# Patient Record
Sex: Male | Born: 1950
Health system: Southern US, Community
[De-identification: ages and names within clinical notes are randomized; demographics above are authoritative.]

## PROBLEM LIST (undated history)

## (undated) DIAGNOSIS — G43909 Migraine, unspecified, not intractable, without status migrainosus: Secondary | ICD-10-CM

## (undated) DIAGNOSIS — I10 Essential (primary) hypertension: Secondary | ICD-10-CM

## (undated) DIAGNOSIS — I471 Supraventricular tachycardia, unspecified: Secondary | ICD-10-CM

## (undated) DIAGNOSIS — I2510 Atherosclerotic heart disease of native coronary artery without angina pectoris: Secondary | ICD-10-CM

## (undated) DIAGNOSIS — R001 Bradycardia, unspecified: Secondary | ICD-10-CM

## (undated) DIAGNOSIS — E785 Hyperlipidemia, unspecified: Secondary | ICD-10-CM

## (undated) DIAGNOSIS — R55 Syncope and collapse: Secondary | ICD-10-CM

## (undated) DIAGNOSIS — R911 Solitary pulmonary nodule: Secondary | ICD-10-CM

## (undated) DIAGNOSIS — K219 Gastro-esophageal reflux disease without esophagitis: Secondary | ICD-10-CM

## (undated) HISTORY — DX: Solitary pulmonary nodule: R91.1

## (undated) HISTORY — DX: Gastro-esophageal reflux disease without esophagitis: K21.9

## (undated) HISTORY — DX: Syncope and collapse: R55

## (undated) HISTORY — DX: Migraine, unspecified, not intractable, without status migrainosus: G43.909

## (undated) HISTORY — DX: Essential (primary) hypertension: I10

---

## 2003-10-19 ENCOUNTER — Emergency Department (HOSPITAL_COMMUNITY): Admission: EM | Admit: 2003-10-19 | Discharge: 2003-10-19 | Payer: Self-pay | Admitting: Emergency Medicine

## 2003-11-02 ENCOUNTER — Encounter: Payer: Self-pay | Admitting: Emergency Medicine

## 2003-11-02 ENCOUNTER — Inpatient Hospital Stay (HOSPITAL_COMMUNITY): Admission: AD | Admit: 2003-11-02 | Discharge: 2003-11-04 | Payer: Self-pay | Admitting: Cardiovascular Disease

## 2004-10-26 ENCOUNTER — Observation Stay (HOSPITAL_COMMUNITY): Admission: EM | Admit: 2004-10-26 | Discharge: 2004-10-27 | Payer: Self-pay | Admitting: Emergency Medicine

## 2004-11-17 ENCOUNTER — Ambulatory Visit (HOSPITAL_COMMUNITY): Admission: RE | Admit: 2004-11-17 | Discharge: 2004-11-17 | Payer: Self-pay | Admitting: *Deleted

## 2006-06-02 ENCOUNTER — Emergency Department (HOSPITAL_COMMUNITY): Admission: EM | Admit: 2006-06-02 | Discharge: 2006-06-03 | Payer: Self-pay | Admitting: Emergency Medicine

## 2006-08-31 ENCOUNTER — Emergency Department (HOSPITAL_COMMUNITY): Admission: EM | Admit: 2006-08-31 | Discharge: 2006-08-31 | Payer: Self-pay | Admitting: Emergency Medicine

## 2010-01-14 ENCOUNTER — Inpatient Hospital Stay (HOSPITAL_COMMUNITY): Admission: EM | Admit: 2010-01-14 | Discharge: 2010-01-15 | Payer: Self-pay | Admitting: Emergency Medicine

## 2010-01-14 DIAGNOSIS — I10 Essential (primary) hypertension: Secondary | ICD-10-CM

## 2010-01-14 HISTORY — DX: Essential (primary) hypertension: I10

## 2010-11-06 ENCOUNTER — Emergency Department (HOSPITAL_COMMUNITY)
Admission: EM | Admit: 2010-11-06 | Discharge: 2010-11-06 | Payer: Self-pay | Source: Home / Self Care | Admitting: Emergency Medicine

## 2011-01-31 LAB — POCT I-STAT, CHEM 8
BUN: 12 mg/dL (ref 6–23)
Calcium, Ion: 1.1 mmol/L — ABNORMAL LOW (ref 1.12–1.32)
Chloride: 110 mEq/L (ref 96–112)
Creatinine, Ser: 1 mg/dL (ref 0.4–1.5)
Glucose, Bld: 92 mg/dL (ref 70–99)
HCT: 42 % (ref 39.0–52.0)
Hemoglobin: 14.3 g/dL (ref 13.0–17.0)
Potassium: 4.1 mEq/L (ref 3.5–5.1)
Sodium: 144 mEq/L (ref 135–145)
TCO2: 27 mmol/L (ref 0–100)

## 2011-01-31 LAB — CARDIAC PANEL(CRET KIN+CKTOT+MB+TROPI)
CK, MB: 1.5 ng/mL (ref 0.3–4.0)
CK, MB: 1.5 ng/mL (ref 0.3–4.0)
Total CK: 178 U/L (ref 7–232)
Total CK: 203 U/L (ref 7–232)
Total CK: 215 U/L (ref 7–232)
Troponin I: 0.01 ng/mL (ref 0.00–0.06)
Troponin I: 0.04 ng/mL (ref 0.00–0.06)

## 2011-01-31 LAB — CBC
HCT: 41.5 % (ref 39.0–52.0)
Hemoglobin: 14.1 g/dL (ref 13.0–17.0)
MCHC: 33.9 g/dL (ref 30.0–36.0)
MCV: 89.1 fL (ref 78.0–100.0)
Platelets: 167 10*3/uL (ref 150–400)
RBC: 4.66 MIL/uL (ref 4.22–5.81)
RDW: 13.9 % (ref 11.5–15.5)
WBC: 5.9 10*3/uL (ref 4.0–10.5)

## 2011-01-31 LAB — DIFFERENTIAL
Basophils Absolute: 0 10*3/uL (ref 0.0–0.1)
Basophils Relative: 0 % (ref 0–1)
Eosinophils Absolute: 0.3 10*3/uL (ref 0.0–0.7)
Eosinophils Relative: 5 % (ref 0–5)
Lymphocytes Relative: 26 % (ref 12–46)
Lymphs Abs: 1.5 10*3/uL (ref 0.7–4.0)
Monocytes Absolute: 0.4 10*3/uL (ref 0.1–1.0)
Monocytes Relative: 7 % (ref 3–12)
Neutro Abs: 3.7 10*3/uL (ref 1.7–7.7)
Neutrophils Relative %: 62 % (ref 43–77)

## 2011-01-31 LAB — D-DIMER, QUANTITATIVE: D-Dimer, Quant: 0.25 ug/mL-FEU (ref 0.00–0.48)

## 2011-01-31 LAB — LIPID PANEL
LDL Cholesterol: 108 mg/dL — ABNORMAL HIGH (ref 0–99)
Total CHOL/HDL Ratio: 3.6 RATIO
VLDL: 14 mg/dL (ref 0–40)

## 2011-01-31 LAB — CK TOTAL AND CKMB (NOT AT ARMC)
CK, MB: 2.1 ng/mL (ref 0.3–4.0)
CK, MB: 2.1 ng/mL (ref 0.3–4.0)
Relative Index: 0.9 (ref 0.0–2.5)
Relative Index: 0.9 (ref 0.0–2.5)
Total CK: 245 U/L — ABNORMAL HIGH (ref 7–232)

## 2011-01-31 LAB — BASIC METABOLIC PANEL
CO2: 30 mEq/L (ref 19–32)
Calcium: 9 mg/dL (ref 8.4–10.5)
Creatinine, Ser: 1.09 mg/dL (ref 0.4–1.5)
GFR calc Af Amer: >60 mL/min (ref >60)
Sodium: 145 mEq/L (ref 135–145)

## 2011-01-31 LAB — TROPONIN I: Troponin I: 0.01 ng/mL (ref 0.00–0.06)

## 2011-01-31 LAB — PROTIME-INR
INR: 1.06 (ref 0.00–1.49)
Prothrombin Time: 13.7 seconds (ref 11.6–15.2)

## 2011-03-26 NOTE — Discharge Summary (Signed)
Edward Freeman, Edward Freeman                ACCOUNT NO.:  [id number]   MEDICAL RECORD NO.:  [id number]          PATIENT TYPE:  INP   LOCATION:  3731                         FACILITY:  [facility]   PHYSICIAN:  Nanetta Batty, M.D.   DATE OF BIRTH:  06-Apr-1951   DATE OF ADMISSION:  10/26/2004  DATE OF DISCHARGE:  10/27/2004                                 DISCHARGE SUMMARY   DISCHARGE DIAGNOSES:  1.  Syncope, probably neurocardiogenic.  2.  History of normal coronaries and normal left ventricular function.   HOSPITAL COURSE:  The patient is a 60 year old male who presented to the  emergency room on October 26, 2004, because of a syncopal spell.  He was on  a ladder up in the attic to check on a noise.  He thought there was an  animal in his attic.  On the way down, he had chest and fell to his knees  and became sweaty and nauseated.  He had near syncope.  He came to the  emergency room.  He was seen by Dr. Elsie Lincoln on admission.  He has had  previous work-up for similar symptoms in the past.  He apparently had a  catheterization November 04, 2003, and showed normal coronaries and normal  LV function.  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  He  had a negative tilt table test at [facility] on 2002.  The patient  was admitted to telemetry.  He was monitored.  He had sinus rhythm and sinus  bradycardia in the high 40s to mid 50s.  He did not have any significant AV  block.  Dr. Alanda Amass felt he could be ambulated on October 27, 2004, and  discharged late on October 27, 2004.  Dr. Alanda Amass wanted him on low dose  beta-blocker.  He was put on pindolol 2.5 mg a day at discharge.  Dr.  Alanda Amass feels he may need tilt table if he has recurrent symptoms.   DISCHARGE MEDICATION:  Pindolol 5 mg one half tablet a day.   LABORATORY DATA:  White count 8.0, hemoglobin 14.9, hematocrit 43.1,  platelets 201.   Sodium 139, potassium 3.7, BUN 11, creatinine 1.0.  LFTs are  normal.  Lipid profile is pending.  TSH is 0.86.   Chest x-ray showed mild cardiomegaly.  INR 1.0.  Cardiac markers are  negative x3.   EKG reveals sinus rhythm, sinus bradycardia with a QTC of 394.   DISPOSITION:  The patient is discharged in stable condition and will follow  up with Dr. Allyson Sabal.  He was set up for an echocardiogram  in the office  before his follow-up with Dr. Allyson Sabal.      LKK/MEDQ  D:  10/27/2004  T:  10/28/2004  Job:  [id number]   cc:   Harrel Lemon. Merla Riches, M.D.  [address]  [place]  [place] [address]  Fax: [phone]
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
## 2013-03-27 ENCOUNTER — Other Ambulatory Visit: Payer: Self-pay

## 2013-03-27 MED ORDER — AMLODIPINE BESYLATE 10 MG PO TABS: 10.0000 mg | ORAL_TABLET | Freq: Every day | ORAL | Status: DC

## 2013-03-27 NOTE — Addendum Note (Signed)
Addended by: Sueanne Margarita on: 03/27/2013 04:36 PM   Modules accepted: Orders

## 2013-03-27 NOTE — Telephone Encounter (Signed)
Rx was sent to pharmacy electronically. 

## 2013-04-25 ENCOUNTER — Other Ambulatory Visit: Payer: Self-pay | Admitting: Cardiovascular Disease

## 2013-04-30 ENCOUNTER — Other Ambulatory Visit: Payer: Self-pay | Admitting: Cardiovascular Disease

## 2013-05-01 NOTE — Telephone Encounter (Signed)
Rx was sent to pharmacy electronically. 

## 2013-05-09 ENCOUNTER — Other Ambulatory Visit: Payer: Self-pay | Admitting: Cardiovascular Disease

## 2013-05-10 ENCOUNTER — Encounter: Payer: Self-pay | Admitting: *Deleted

## 2013-05-14 ENCOUNTER — Encounter: Payer: Self-pay | Admitting: Cardiovascular Disease

## 2013-05-17 ENCOUNTER — Ambulatory Visit (INDEPENDENT_AMBULATORY_CARE_PROVIDER_SITE_OTHER): Payer: BC Managed Care – PPO | Admitting: Cardiovascular Disease

## 2013-05-17 ENCOUNTER — Encounter: Payer: Self-pay | Admitting: Cardiovascular Disease

## 2013-05-17 VITALS — BP 128/68 | HR 55 | Ht 70.5 in | Wt 194.6 lb

## 2013-05-17 DIAGNOSIS — R5381 Other malaise: Secondary | ICD-10-CM

## 2013-05-17 DIAGNOSIS — R0600 Dyspnea, unspecified: Secondary | ICD-10-CM

## 2013-05-17 DIAGNOSIS — M79606 Pain in leg, unspecified: Secondary | ICD-10-CM | POA: Insufficient documentation

## 2013-05-17 DIAGNOSIS — R222 Localized swelling, mass and lump, trunk: Secondary | ICD-10-CM

## 2013-05-17 DIAGNOSIS — R5383 Other fatigue: Secondary | ICD-10-CM

## 2013-05-17 DIAGNOSIS — Z79899 Other long term (current) drug therapy: Secondary | ICD-10-CM

## 2013-05-17 DIAGNOSIS — E785 Hyperlipidemia, unspecified: Secondary | ICD-10-CM

## 2013-05-17 DIAGNOSIS — I10 Essential (primary) hypertension: Secondary | ICD-10-CM

## 2013-05-17 DIAGNOSIS — I739 Peripheral vascular disease, unspecified: Secondary | ICD-10-CM

## 2013-05-17 DIAGNOSIS — R0602 Shortness of breath: Secondary | ICD-10-CM | POA: Insufficient documentation

## 2013-05-17 DIAGNOSIS — R0609 Other forms of dyspnea: Secondary | ICD-10-CM

## 2013-05-17 NOTE — Patient Instructions (Signed)
Your physician recommends that you schedule a follow-up appointment in: 1 month  Your physician recommends that you return for lab work CMP, CBC, TSH, FREE T4, LIPIDS  Your physician has requested that you have an echocardiogram. Echocardiography is a painless test that uses sound waves to create images of your heart. It provides your doctor with information about the size and shape of your heart and how well your heart's chambers and valves are working. This procedure takes approximately one hour. There are no restrictions for this procedure.  Your physician has requested that you have a lower extremity arterial exercise duplex. During this test, exercise and ultrasound are used to evaluate arterial blood flow in the legs. Allow one hour for this exam. There are no restrictions or special instructions.  Non-Cardiac CT scanning, (CAT scanning), is a noninvasive, special x-ray that produces cross-sectional images of the body using x-rays and a computer. CT scans help physicians diagnose and treat medical conditions. For some CT exams, a contrast material is used to enhance visibility in the area of the body being studied. CT scans provide greater clarity and reveal more details than regular x-ray exams.

## 2013-05-17 NOTE — Progress Notes (Signed)
   05/17/2013 Edward Freeman   Dec 20, 1950  [id number]  Primary Physician No primary provider on file. Primary Cardiologist: Runell Gess MD Roseanne Reno   HPI:  Edward Freeman is a 62 year old married African American male father of 2, grandfather to 2 grandchildren he works doing Insurance claims handler. He was seen by Dr. Clarene Duke last approximately 16 months ago. I performed cardiac catheterization on him 11/04/03 which was completely normal. His cardiac risk factors include hypertension as well as family history with a mother who died of a myocardial infarction at age 57. He denies chest pain but does complain of increasing dyspnea on exertion. He also had a small apical nodule by CT scan several years ago with recommendations of followup which was never pursued     No Known Allergies  History   Social History  . Marital Status: Single    Spouse Name: N/A    Number of Children: N/A  . Years of Education: N/A   Occupational History  . Not on file.   Social History Main Topics  . Smoking status: Never Smoker   . Smokeless tobacco: Never Used  . Alcohol Use: Yes     Comment: 1-2 beers a month  . Drug Use: No  . Sexually Active: Not on file   Other Topics Concern  . Not on file   Social History Narrative  . No narrative on file     Review of Systems: General: negative for chills, fever, night sweats or weight changes.  Cardiovascular: negative for chest pain, dyspnea on exertion, edema, orthopnea, palpitations, paroxysmal nocturnal dyspnea or shortness of breath Dermatological: negative for rash Respiratory: negative for cough or wheezing Urologic: negative for hematuria Abdominal: negative for nausea, vomiting, diarrhea, bright red blood per rectum, melena, or hematemesis Neurologic: negative for visual changes, syncope, or dizziness All other systems reviewed and are otherwise negative except as noted above.    Blood pressure 128/68, pulse 55, height 5'  10.5" (1.791 m), weight 194 lb 9.6 oz (88.27 kg).  General appearance: alert and no distress Neck: no adenopathy, no carotid bruit, no JVD, supple, symmetrical, trachea midline and thyroid not enlarged, symmetric, no tenderness/mass/nodules Lungs: clear to auscultation bilaterally Heart: regular rate and rhythm, S1, S2 normal, no murmur, click, rub or gallop Extremities: extremities normal, atraumatic, no cyanosis or edema Pulses: 2+ and symmetric  EKG first bradycardia at 55 without ST or T wave changes  ASSESSMENT AND PLAN:   Essential hypertension Under good control on current medications  Dyspnea on exertion No history of tobacco abuse. Will check 2-D echocardiogram  Leg pain There are atypical features and it sounds more like restless leg syndrome however we will get llower extremity arterial Dopplers to evaluate      Runell Gess MD [facility], [facility] 05/17/2013 10:48 AM

## 2013-05-17 NOTE — Assessment & Plan Note (Signed)
There are atypical features and it sounds more like restless leg syndrome however we will get llower extremity arterial Dopplers to evaluate

## 2013-05-17 NOTE — Assessment & Plan Note (Signed)
Under good control on current medications 

## 2013-05-17 NOTE — Assessment & Plan Note (Signed)
No history of tobacco abuse. Will check 2-D echocardiogram

## 2013-05-22 ENCOUNTER — Other Ambulatory Visit: Payer: BC Managed Care – PPO

## 2013-06-05 ENCOUNTER — Ambulatory Visit (HOSPITAL_COMMUNITY): Payer: BC Managed Care – PPO

## 2013-06-05 ENCOUNTER — Encounter (HOSPITAL_COMMUNITY): Payer: BC Managed Care – PPO

## 2013-06-11 ENCOUNTER — Ambulatory Visit: Payer: BC Managed Care – PPO | Admitting: Cardiovascular Disease

## 2013-06-17 ENCOUNTER — Other Ambulatory Visit: Payer: Self-pay | Admitting: Cardiovascular Disease

## 2013-06-18 NOTE — Telephone Encounter (Signed)
Rx was sent to pharmacy electronically. 

## 2013-06-25 ENCOUNTER — Telehealth: Payer: Self-pay | Admitting: Cardiovascular Disease

## 2013-06-25 NOTE — Telephone Encounter (Signed)
Patient had physical w/ dr. Allyson Sabal.  States when he goes to get his medicine refilled there is a problem and he can't get a full refill or they have to call us.  There always seems to be a problem.  He would like to make sure his meds were called in the [facility].  He gets #30 at a time.

## 2013-06-26 NOTE — Telephone Encounter (Signed)
Returned call.  Left message to call back before 4pm.  

## 2014-01-17 ENCOUNTER — Ambulatory Visit (INDEPENDENT_AMBULATORY_CARE_PROVIDER_SITE_OTHER): Payer: No Typology Code available for payment source | Admitting: Emergency Medicine

## 2014-01-17 VITALS — BP 136/74 | HR 56 | Temp 98.2°F | Resp 18 | Ht 70.5 in | Wt 196.0 lb

## 2014-01-17 DIAGNOSIS — S335XXA Sprain of ligaments of lumbar spine, initial encounter: Secondary | ICD-10-CM

## 2014-01-17 DIAGNOSIS — M5430 Sciatica, unspecified side: Secondary | ICD-10-CM

## 2014-01-17 MED ORDER — TRAMADOL HCL 50 MG PO TABS: 50.0000 mg | ORAL_TABLET | Freq: Three times a day (TID) | ORAL | Status: DC | PRN

## 2014-01-17 MED ORDER — CYCLOBENZAPRINE HCL 10 MG PO TABS: 10.0000 mg | ORAL_TABLET | Freq: Three times a day (TID) | ORAL | Status: DC | PRN

## 2014-01-17 MED ORDER — NAPROXEN SODIUM 550 MG PO TABS: 550.0000 mg | ORAL_TABLET | Freq: Two times a day (BID) | ORAL | Status: DC

## 2014-01-17 NOTE — Progress Notes (Signed)
[facility] [address] [place] [address] [phone]  Date:  01/17/2014   Name:  Edward Freeman   DOB:  23-Nov-1950   MRN:  [id number]  PCP:  No primary provider on file.    Chief Complaint: Back Pain   History of Present Illness:  Edward Freeman is a 63 y.o. very pleasant male patient who presents with the following:  Says he helped his brother load fire wood over the weekend.  By Tuesday he developed pain in the right sciatic notch radiating in to his right leg to the ankle and into the groin.  No weakness or neuro symptoms.  Has occasional pain in left side to the knee.  History of prior sciatica. No direct injury.  No relief with OTC meds.  Worse when sits.  Better when stands.  Denies other complaint or health concern today.   Patient Active Problem List   Diagnosis Date Noted  . Essential hypertension 05/17/2013  . Dyspnea on exertion 05/17/2013  . Leg pain 05/17/2013    Past Medical History  Diagnosis Date  . Nodule of left lung   . Other malaise and fatigue   . Syncope and collapse   . Hypertension 01/14/10    myocardial imaging- no evidence of ischemia or infarction. EF 53%  . Dyspnea on exertion     History reviewed. No pertinent past surgical history.  History  Substance Use Topics  . Smoking status: Never Smoker   . Smokeless tobacco: Never Used  . Alcohol Use: No     Comment: 1-2 beers a month    Family History  Problem Relation Age of Onset  . Hypertension Mother   . Diabetes Sister   . Diabetes Brother   . Diabetes Sister     No Known Allergies  Medication list has been reviewed and updated.  Current Outpatient Prescriptions on File Prior to Visit  Medication Sig Dispense Refill  . amLODipine (NORVASC) 10 MG tablet Take 1 tablet (10 mg total) by mouth daily.  30 tablet  11  . aspirin 81 MG tablet Take 81 mg by mouth every other day.        No current facility-administered medications on file prior to visit.     Review of Systems:  As per HPI, otherwise negative.    Physical Examination: Filed Vitals:   01/17/14 1345  BP: 136/74  Pulse: 56  Temp: 98.2 F (36.8 C)  Resp: 18   Filed Vitals:   01/17/14 1345  Height: 5' 10.5" (1.791 m)  Weight: 196 lb (88.905 kg)   Body mass index is 27.72 kg/(m^2). Ideal Body Weight: Weight in (lb) to have BMI = 25: 176.4   GEN: WDWN, NAD, Non-toxic, Alert & Oriented x 3 HEENT: Atraumatic, Normocephalic.  Ears and Nose: No external deformity. EXTR: No clubbing/cyanosis/edema NEURO: Normal gait.  PSYCH: Normally interactive. Conversant. Not depressed or anxious appearing.  Calm demeanor.  BACK:  Not tender neuro intact.  Tender sciatic notch.    Assessment and Plan: Sciatic neuritis Lumbar strain Anaprox Flexeril   Signed,  Ellison Carwin, MD

## 2014-01-17 NOTE — Patient Instructions (Signed)
Lumbosacral Strain Lumbosacral strain is a strain of any of the parts that make up your lumbosacral vertebrae. Your lumbosacral vertebrae are the bones that make up the lower third of your backbone. Your lumbosacral vertebrae are held together by muscles and tough, fibrous tissue (ligaments).  CAUSES  A sudden blow to your back can cause lumbosacral strain. Also, anything that causes an excessive stretch of the muscles in the low back can cause this strain. This is typically seen when people exert themselves strenuously, fall, lift heavy objects, bend, or crouch repeatedly. RISK FACTORS  Physically demanding work.  Participation in pushing or pulling sports or sports that require sudden twist of the back (tennis, golf, baseball).  Weight lifting.  Excessive lower back curvature.  Forward-tilted pelvis.  Weak back or abdominal muscles or both.  Tight hamstrings. SIGNS AND SYMPTOMS  Lumbosacral strain may cause pain in the area of your injury or pain that moves (radiates) down your leg.  DIAGNOSIS Your health care provider can often diagnose lumbosacral strain through a physical exam. In some cases, you may need tests such as X-ray exams.  TREATMENT  Treatment for your lower back injury depends on many factors that your clinician will have to evaluate. However, most treatment will include the use of anti-inflammatory medicines. HOME CARE INSTRUCTIONS   Avoid hard physical activities (tennis, racquetball, waterskiing) if you are not in proper physical condition for it. This may aggravate or create problems.  If you have a back problem, avoid sports requiring sudden body movements. Swimming and walking are generally safer activities.  Maintain good posture.  Maintain a healthy weight.  For acute conditions, you may put ice on the injured area.  Put ice in a plastic bag.  Place a towel between your skin and the bag.  Leave the ice on for 20 minutes, 2 3 times a day.  When the  low back starts healing, stretching and strengthening exercises may be recommended. SEEK MEDICAL CARE IF:  Your back pain is getting worse.  You experience severe back pain not relieved with medicines. SEEK IMMEDIATE MEDICAL CARE IF:   You have numbness, tingling, weakness, or problems with the use of your arms or legs.  There is a change in bowel or bladder control.  You have increasing pain in any area of the body, including your belly (abdomen).  You notice shortness of breath, dizziness, or feel faint.  You feel sick to your stomach (nauseous), are throwing up (vomiting), or become sweaty.  You notice discoloration of your toes or legs, or your feet get very cold. MAKE SURE YOU:   Understand these instructions.  Will watch your condition.  Will get help right away if you are not doing well or get worse. Document Released: 08/04/2005 Document Revised: 08/15/2013 Document Reviewed: 06/13/2013 [employer] Patient Information 2014 [employer].  

## 2014-07-02 ENCOUNTER — Other Ambulatory Visit: Payer: Self-pay | Admitting: Cardiovascular Disease

## 2014-07-02 NOTE — Telephone Encounter (Signed)
Rx was sent to pharmacy electronically. 

## 2014-08-01 ENCOUNTER — Other Ambulatory Visit: Payer: Self-pay | Admitting: Cardiovascular Disease

## 2014-08-01 NOTE — Telephone Encounter (Signed)
Rx was sent to pharmacy electronically. 

## 2014-08-04 ENCOUNTER — Other Ambulatory Visit: Payer: Self-pay | Admitting: Cardiovascular Disease

## 2014-08-05 NOTE — Telephone Encounter (Signed)
Refilled #15 tablets with NO refills on 08/01/2014 >> patient needs appointment

## 2014-08-23 ENCOUNTER — Other Ambulatory Visit: Payer: Self-pay | Admitting: Cardiovascular Disease

## 2014-08-23 NOTE — Telephone Encounter (Signed)
Rx was sent to pharmacy electronically. 

## 2014-09-08 ENCOUNTER — Other Ambulatory Visit: Payer: Self-pay | Admitting: Cardiovascular Disease

## 2014-09-20 ENCOUNTER — Ambulatory Visit (INDEPENDENT_AMBULATORY_CARE_PROVIDER_SITE_OTHER): Payer: No Typology Code available for payment source | Admitting: Cardiovascular Disease

## 2014-09-20 ENCOUNTER — Encounter: Payer: Self-pay | Admitting: Cardiovascular Disease

## 2014-09-20 VITALS — BP 126/78 | HR 53 | Ht 70.5 in | Wt 197.5 lb

## 2014-09-20 DIAGNOSIS — I10 Essential (primary) hypertension: Secondary | ICD-10-CM

## 2014-09-20 DIAGNOSIS — E785 Hyperlipidemia, unspecified: Secondary | ICD-10-CM

## 2014-09-20 DIAGNOSIS — Z79899 Other long term (current) drug therapy: Secondary | ICD-10-CM

## 2014-09-20 MED ORDER — AMLODIPINE BESYLATE 10 MG PO TABS: 10.0000 mg | ORAL_TABLET | Freq: Every day | ORAL | Status: DC

## 2014-09-20 NOTE — Patient Instructions (Signed)
Your physician wants you to follow-up in: 1 year with Dr Berry. You will receive a reminder letter in the mail two months in advance. If you don't receive a letter, please call our office to schedule the follow-up appointment.  Your physician recommends that you return for a FASTING lipid profile   

## 2014-09-20 NOTE — Assessment & Plan Note (Signed)
History of hypertension with blood pressure measured today 126/78. He is on amlodipine 10 mg. We will keep him on same truck at same dose

## 2014-09-20 NOTE — Progress Notes (Signed)
     09/20/2014 Neta Mends   [birth year]  [id number]  Primary Physician No PCP Per Patient Primary Cardiologist: Lorretta Harp MD Renae Gloss   HPI:  [name] is a 63 year old married African American male father of 2, grandfather to 2 grandchildren he works doing Furniture conservator/restorer.  I performed cardiac catheterization on him 11/04/03 which was completely normal. His cardiac risk factors include hypertension as well as family history with a mother who died of a myocardial infarction at age 58. He denies chest pain or shortness of breath.  He also had a small apical nodule by CT scan several years ago with recommendations of followup which was never pursued. I've offered him follow-up CT scans which she has declined because of fiscal constraints.   Current Outpatient Prescriptions  Medication Sig Dispense Refill  . amLODipine (NORVASC) 10 MG tablet Take 1 tablet (10 mg total) by mouth daily. Please keep appointment on 11/13 for further refills 15 tablet 0  . aspirin 81 MG tablet Take 81 mg by mouth every other day.      No current facility-administered medications for this visit.    No Known Allergies  History   Social History  . Marital Status: Single    Spouse Name: N/A    Number of Children: N/A  . Years of Education: N/A   Occupational History  . Not on file.   Social History Main Topics  . Smoking status: Never Smoker   . Smokeless tobacco: Never Used  . Alcohol Use: No     Comment: 1-2 beers a month  . Drug Use: No  . Sexual Activity: Not on file   Other Topics Concern  . Not on file   Social History Narrative     Review of Systems: General: negative for chills, fever, night sweats or weight changes.  Cardiovascular: negative for chest pain, dyspnea on exertion, edema, orthopnea, palpitations, paroxysmal nocturnal dyspnea or shortness of breath Dermatological: negative for rash Respiratory: negative for cough or wheezing Urologic:  negative for hematuria Abdominal: negative for nausea, vomiting, diarrhea, bright red blood per rectum, melena, or hematemesis Neurologic: negative for visual changes, syncope, or dizziness All other systems reviewed and are otherwise negative except as noted above.    Blood pressure 126/78, pulse 53, height 5' 10.5" (1.791 m), weight 197 lb 8 oz (89.585 kg).  General appearance: alert and no distress Neck: no adenopathy, no carotid bruit, no JVD, supple, symmetrical, trachea midline and thyroid not enlarged, symmetric, no tenderness/mass/nodules Lungs: clear to auscultation bilaterally Heart: regular rate and rhythm, S1, S2 normal, no murmur, click, rub or gallop Extremities: extremities normal, atraumatic, no cyanosis or edema  EKG sinus bradycardia at 53 without ST or T-wave changes. I personally reviewed the EKG  ASSESSMENT AND PLAN:   Essential hypertension History of hypertension with blood pressure measured today 126/78. He is on amlodipine 10 mg. We will keep him on same truck at same dose      Lorretta Harp MD [facility], [facility] 09/20/2014 4:20 PM

## 2014-10-01 LAB — COMPLETE METABOLIC PANEL WITH GFR
ALT: 28 U/L (ref 0–53)
AST: 27 U/L (ref 0–37)
Albumin: 4.1 g/dL (ref 3.5–5.2)
Alkaline Phosphatase: 55 U/L (ref 39–117)
BILIRUBIN TOTAL: 1.4 mg/dL — AB (ref 0.2–1.2)
BUN: 16 mg/dL (ref 6–23)
CO2: 27 meq/L (ref 19–32)
CREATININE: 1.1 mg/dL (ref 0.50–1.35)
Calcium: 8.9 mg/dL (ref 8.4–10.5)
Chloride: 108 mEq/L (ref 96–112)
GFR, EST AFRICAN AMERICAN: 82 mL/min
GFR, EST NON AFRICAN AMERICAN: 71 mL/min
GLUCOSE: 79 mg/dL (ref 70–99)
Potassium: 4.2 mEq/L (ref 3.5–5.3)
Sodium: 143 mEq/L (ref 135–145)
Total Protein: 6.6 g/dL (ref 6.0–8.3)

## 2014-10-01 LAB — CBC
HCT: 44.3 % (ref 39.0–52.0)
HEMOGLOBIN: 14.9 g/dL (ref 13.0–17.0)
MCH: 29.7 pg (ref 26.0–34.0)
MCHC: 33.6 g/dL (ref 30.0–36.0)
MCV: 88.2 fL (ref 78.0–100.0)
PLATELETS: 210 10*3/uL (ref 150–400)
RBC: 5.02 MIL/uL (ref 4.22–5.81)
RDW: 13.2 % (ref 11.5–15.5)
WBC: 5.2 10*3/uL (ref 4.0–10.5)

## 2014-10-01 LAB — LIPID PANEL
CHOLESTEROL: 206 mg/dL — AB (ref 0–200)
HDL: 64 mg/dL (ref >39)
LDL Cholesterol: 133 mg/dL — ABNORMAL HIGH (ref 0–99)
TRIGLYCERIDES: 44 mg/dL (ref <150)
Total CHOL/HDL Ratio: 3.2 Ratio
VLDL: 9 mg/dL (ref 0–40)

## 2014-10-01 LAB — TSH: TSH: 0.817 u[IU]/mL (ref 0.350–4.500)

## 2014-10-07 ENCOUNTER — Encounter: Payer: Self-pay | Admitting: *Deleted

## 2014-10-07 ENCOUNTER — Telehealth: Payer: Self-pay | Admitting: *Deleted

## 2014-10-07 DIAGNOSIS — E785 Hyperlipidemia, unspecified: Secondary | ICD-10-CM

## 2014-10-07 NOTE — Telephone Encounter (Signed)
-----   Message from Lorretta Harp, MD sent at 10/02/2014  4:50 PM EST ----- Heart healthy diet and recheck 3 months

## 2014-10-07 NOTE — Telephone Encounter (Signed)
Low fat/Low cholesterol diet info mailed, lab slips for repeat

## 2015-09-24 ENCOUNTER — Other Ambulatory Visit: Payer: Self-pay | Admitting: Cardiovascular Disease

## 2015-09-24 NOTE — Telephone Encounter (Signed)
Rx request sent to pharmacy.  

## 2015-09-25 ENCOUNTER — Other Ambulatory Visit: Payer: Self-pay | Admitting: Cardiovascular Disease

## 2015-09-25 NOTE — Telephone Encounter (Signed)
REFILL 

## 2015-10-21 ENCOUNTER — Encounter: Payer: Self-pay | Admitting: Cardiovascular Disease

## 2015-10-21 ENCOUNTER — Ambulatory Visit (INDEPENDENT_AMBULATORY_CARE_PROVIDER_SITE_OTHER): Payer: 59 | Admitting: Cardiovascular Disease

## 2015-10-21 VITALS — BP 142/80 | HR 54 | Ht 70.0 in | Wt 198.0 lb

## 2015-10-21 DIAGNOSIS — E785 Hyperlipidemia, unspecified: Secondary | ICD-10-CM | POA: Diagnosis not present

## 2015-10-21 DIAGNOSIS — I10 Essential (primary) hypertension: Secondary | ICD-10-CM | POA: Diagnosis not present

## 2015-10-21 LAB — LIPID PANEL
CHOL/HDL RATIO: 3 ratio (ref <=5.0)
Cholesterol: 191 mg/dL (ref 125–200)
HDL: 63 mg/dL (ref >=40)
LDL Cholesterol: 117 mg/dL (ref <130)
Triglycerides: 55 mg/dL (ref <150)
VLDL: 11 mg/dL (ref <30)

## 2015-10-21 LAB — COMPREHENSIVE METABOLIC PANEL
ALK PHOS: 57 U/L (ref 40–115)
ALT: 20 U/L (ref 9–46)
AST: 19 U/L (ref 10–35)
Albumin: 3.9 g/dL (ref 3.6–5.1)
BUN: 14 mg/dL (ref 7–25)
CO2: 23 mmol/L (ref 20–31)
CREATININE: 1.05 mg/dL (ref 0.70–1.25)
Calcium: 9.2 mg/dL (ref 8.6–10.3)
Chloride: 109 mmol/L (ref 98–110)
Glucose, Bld: 83 mg/dL (ref 65–99)
Potassium: 4.6 mmol/L (ref 3.5–5.3)
SODIUM: 145 mmol/L (ref 135–146)
TOTAL PROTEIN: 6.2 g/dL (ref 6.1–8.1)
Total Bilirubin: 0.9 mg/dL (ref 0.2–1.2)

## 2015-10-21 LAB — T4, FREE: FREE T4: 0.89 ng/dL (ref 0.80–1.80)

## 2015-10-21 LAB — CBC WITH DIFFERENTIAL/PLATELET
BASOS PCT: 0 % (ref 0–1)
Basophils Absolute: 0 10*3/uL (ref 0.0–0.1)
EOS PCT: 5 % (ref 0–5)
Eosinophils Absolute: 0.3 10*3/uL (ref 0.0–0.7)
HEMATOCRIT: 42.6 % (ref 39.0–52.0)
Hemoglobin: 14.7 g/dL (ref 13.0–17.0)
Lymphocytes Relative: 27 % (ref 12–46)
Lymphs Abs: 1.5 10*3/uL (ref 0.7–4.0)
MCH: 29.6 pg (ref 26.0–34.0)
MCHC: 34.5 g/dL (ref 30.0–36.0)
MCV: 85.9 fL (ref 78.0–100.0)
MONO ABS: 0.5 10*3/uL (ref 0.1–1.0)
MPV: 10.6 fL (ref 8.6–12.4)
Monocytes Relative: 9 % (ref 3–12)
Neutro Abs: 3.2 10*3/uL (ref 1.7–7.7)
Neutrophils Relative %: 59 % (ref 43–77)
Platelets: 202 10*3/uL (ref 150–400)
RBC: 4.96 MIL/uL (ref 4.22–5.81)
RDW: 13.7 % (ref 11.5–15.5)
WBC: 5.4 10*3/uL (ref 4.0–10.5)

## 2015-10-21 LAB — HEPATIC FUNCTION PANEL
ALBUMIN: 3.9 g/dL (ref 3.6–5.1)
ALT: 20 U/L (ref 9–46)
AST: 19 U/L (ref 10–35)
Alkaline Phosphatase: 57 U/L (ref 40–115)
BILIRUBIN TOTAL: 0.9 mg/dL (ref 0.2–1.2)
Bilirubin, Direct: 0.1 mg/dL (ref <=0.2)
Indirect Bilirubin: 0.8 mg/dL (ref 0.2–1.2)
Total Protein: 6.2 g/dL (ref 6.1–8.1)

## 2015-10-21 LAB — TSH: TSH: 1.161 u[IU]/mL (ref 0.350–4.500)

## 2015-10-21 LAB — MAGNESIUM: Magnesium: 2 mg/dL (ref 1.5–2.5)

## 2015-10-21 MED ORDER — AMLODIPINE BESYLATE 10 MG PO TABS: 10.0000 mg | ORAL_TABLET | Freq: Every day | ORAL | Status: DC

## 2015-10-21 NOTE — Patient Instructions (Signed)
Medication Instructions:  Your physician recommends that you continue on your current medications as directed. Please refer to the Current Medication list given to you today.   Labwork: Your physician recommends that you return for lab work in: FASTING  The lab can be found on the FIRST FLOOR of out building in [address]   Testing/Procedures: none  Follow-Up: Your physician wants you to follow-up in: 12 months with Dr. Gwenlyn Found. You will receive a reminder letter in the mail two months in advance. If you don't receive a letter, please call our office to schedule the follow-up appointment.   Any Other Special Instructions Will Be Listed Below (If Applicable).     If you need a refill on your cardiac medications before your next appointment, please call your pharmacy.

## 2015-10-21 NOTE — Assessment & Plan Note (Signed)
History of hyperlipidemia with his last cholesterol level performed 10/01/14 nitroglycerin 206, LDL 133 and HDL 64. He does admit to dietary indiscretion. He is not on statin drug. We will recheck lipid and liver profile

## 2015-10-21 NOTE — Progress Notes (Signed)
     10/21/2015 Edward Freeman   1951-03-16  [id number]  Primary Physician No PCP Per Patient Primary Cardiologist: Lorretta Harp MD Renae Gloss   HPI:  Edward Freeman is a 64 year old married African American male father of 2, grandfather to 2 grandchildren he works doing Furniture conservator/restorer.I last saw him in the office 09/20/14. I performed cardiac catheterization on him 11/04/03 which was completely normal. His cardiac risk factors include hypertension as well as family history with a mother who died of a myocardial infarction at age 68. He denies chest pain or shortness of breath. He also had a small apical nodule by CT scan several years ago with recommendations of followup which was never pursued. I've offered him follow-up CT scans which she has declined because of fiscal constraints. Since I saw him a year ago he's had one episode of "fluttering in his chest" with some mild pressure that left him somewhat weak afterwards. He also complains of some pain in his legs when he walks.   Current Outpatient Prescriptions  Medication Sig Dispense Refill  . amLODipine (NORVASC) 10 MG tablet Take 1 tablet (10 mg total) by mouth daily. 30 tablet 11  . aspirin 81 MG tablet Take 81 mg by mouth every other day.      No current facility-administered medications for this visit.    No Known Allergies  Social History   Social History  . Marital Status: Single    Spouse Name: N/A  . Number of Children: N/A  . Years of Education: N/A   Occupational History  . Not on file.   Social History Main Topics  . Smoking status: Never Smoker   . Smokeless tobacco: Never Used  . Alcohol Use: No     Comment: 1-2 beers a month  . Drug Use: No  . Sexual Activity: Not on file   Other Topics Concern  . Not on file   Social History Narrative     Review of Systems: General: negative for chills, fever, night sweats or weight changes.  Cardiovascular: negative for chest pain, dyspnea  on exertion, edema, orthopnea, palpitations, paroxysmal nocturnal dyspnea or shortness of breath Dermatological: negative for rash Respiratory: negative for cough or wheezing Urologic: negative for hematuria Abdominal: negative for nausea, vomiting, diarrhea, bright red blood per rectum, melena, or hematemesis Neurologic: negative for visual changes, syncope, or dizziness All other systems reviewed and are otherwise negative except as noted above.    Blood pressure 142/80, pulse 54, height 5\' 10"  (1.778 m), weight 198 lb (89.812 kg).  General appearance: alert and no distress Neck: no adenopathy, no carotid bruit, no JVD, supple, symmetrical, trachea midline and thyroid not enlarged, symmetric, no tenderness/mass/nodules Lungs: clear to auscultation bilaterally Heart: regular rate and rhythm, S1, S2 normal, no murmur, click, rub or gallop Extremities: extremities normal, atraumatic, no cyanosis or edema  EKG sinus bradycardia 54 without ST or T-wave changes. I personally reviewed this EKG  ASSESSMENT AND PLAN:   Essential hypertension History of hypertension with blood pressure initially 142/80 on amlodipine. Continue current meds are current dosing  Hyperlipidemia History of hyperlipidemia with his last cholesterol level performed 10/01/14 nitroglycerin 206, LDL 133 and HDL 64. He does admit to dietary indiscretion. He is not on statin drug. We will recheck lipid and liver profile      Lorretta Harp MD [facility], [facility] 10/21/2015 9:28 AM

## 2015-10-21 NOTE — Assessment & Plan Note (Signed)
History of hypertension with blood pressure initially 142/80 on amlodipine. Continue current meds are current dosing

## 2015-10-22 LAB — HEMOGLOBIN A1C
Hgb A1c MFr Bld: 6 % — ABNORMAL HIGH (ref <5.7)
Mean Plasma Glucose: 126 mg/dL — ABNORMAL HIGH (ref <117)

## 2015-10-22 LAB — PSA: PSA: 8.46 ng/mL — AB (ref <=4.00)

## 2015-11-27 ENCOUNTER — Other Ambulatory Visit: Payer: Self-pay | Admitting: Cardiovascular Disease

## 2015-11-27 NOTE — Telephone Encounter (Signed)
Rx request sent to pharmacy.  

## 2016-10-20 ENCOUNTER — Encounter: Payer: Self-pay | Admitting: Cardiovascular Disease

## 2016-10-20 ENCOUNTER — Ambulatory Visit (INDEPENDENT_AMBULATORY_CARE_PROVIDER_SITE_OTHER): Payer: Self-pay | Admitting: Cardiovascular Disease

## 2016-10-20 VITALS — BP 120/70 | HR 51 | Ht 70.5 in | Wt 192.0 lb

## 2016-10-20 DIAGNOSIS — I10 Essential (primary) hypertension: Secondary | ICD-10-CM

## 2016-10-20 DIAGNOSIS — R911 Solitary pulmonary nodule: Secondary | ICD-10-CM

## 2016-10-20 DIAGNOSIS — Z125 Encounter for screening for malignant neoplasm of prostate: Secondary | ICD-10-CM

## 2016-10-20 DIAGNOSIS — E785 Hyperlipidemia, unspecified: Secondary | ICD-10-CM

## 2016-10-20 DIAGNOSIS — Z79899 Other long term (current) drug therapy: Secondary | ICD-10-CM

## 2016-10-20 LAB — BASIC METABOLIC PANEL WITH GFR
BUN: 17 mg/dL (ref 7–25)
CO2: 28 mmol/L (ref 20–31)
Calcium: 9.4 mg/dL (ref 8.6–10.3)
Chloride: 109 mmol/L (ref 98–110)
Creat: 1.16 mg/dL (ref 0.70–1.25)
GFR, EST AFRICAN AMERICAN: 76 mL/min (ref >=60)
GFR, EST NON AFRICAN AMERICAN: 66 mL/min (ref >=60)
GLUCOSE: 87 mg/dL (ref 65–99)
POTASSIUM: 4.3 mmol/L (ref 3.5–5.3)
SODIUM: 144 mmol/L (ref 135–146)

## 2016-10-20 LAB — CBC WITH DIFFERENTIAL/PLATELET
BASOS PCT: 0 %
Basophils Absolute: 0 cells/uL (ref 0–200)
Eosinophils Absolute: 270 cells/uL (ref 15–500)
Eosinophils Relative: 5 %
HEMATOCRIT: 44.5 % (ref 38.5–50.0)
HEMOGLOBIN: 15 g/dL (ref 13.2–17.1)
LYMPHS ABS: 1512 {cells}/uL (ref 850–3900)
Lymphocytes Relative: 28 %
MCH: 29.7 pg (ref 27.0–33.0)
MCHC: 33.7 g/dL (ref 32.0–36.0)
MCV: 88.1 fL (ref 80.0–100.0)
MONO ABS: 378 {cells}/uL (ref 200–950)
MPV: 10.5 fL (ref 7.5–12.5)
Monocytes Relative: 7 %
Neutro Abs: 3240 cells/uL (ref 1500–7800)
Neutrophils Relative %: 60 %
Platelets: 188 10*3/uL (ref 140–400)
RBC: 5.05 MIL/uL (ref 4.20–5.80)
RDW: 13.2 % (ref 11.0–15.0)
WBC: 5.4 10*3/uL (ref 3.8–10.8)

## 2016-10-20 LAB — LIPID PANEL
Cholesterol: 197 mg/dL (ref <200)
HDL: 69 mg/dL (ref >40)
LDL CALC: 119 mg/dL — AB (ref <100)
Total CHOL/HDL Ratio: 2.9 Ratio (ref <5.0)
Triglycerides: 44 mg/dL (ref <150)
VLDL: 9 mg/dL (ref <30)

## 2016-10-20 LAB — HEPATIC FUNCTION PANEL
ALK PHOS: 54 U/L (ref 40–115)
ALT: 21 U/L (ref 9–46)
AST: 20 U/L (ref 10–35)
Albumin: 4.1 g/dL (ref 3.6–5.1)
BILIRUBIN DIRECT: 0.2 mg/dL (ref <=0.2)
BILIRUBIN INDIRECT: 0.9 mg/dL (ref 0.2–1.2)
BILIRUBIN TOTAL: 1.1 mg/dL (ref 0.2–1.2)
Total Protein: 6.8 g/dL (ref 6.1–8.1)

## 2016-10-20 LAB — T4, FREE: Free T4: 0.8 ng/dL (ref 0.8–1.8)

## 2016-10-20 LAB — TSH: TSH: 0.98 m[IU]/L (ref 0.40–4.50)

## 2016-10-20 LAB — PSA: PSA: 7.3 ng/mL — AB (ref <=4.0)

## 2016-10-20 NOTE — Assessment & Plan Note (Signed)
History of hyperlipidemia not on statin therapy.  We will recheck a lipid and liver profile. 

## 2016-10-20 NOTE — Patient Instructions (Signed)
Medication Instructions: Your physician recommends that you continue on your current medications as directed. Please refer to the Current Medication list given to you today.   Labwork: Your physician recommends that you return for lab work in: PSA, TSH, Free T4, BMP, CBC, fasting Lipid/liver profile   Testing/Procedures: Your physician has recommended that you have a chest ct.   Follow-Up: Your physician wants you to follow-up in: 1 year with Dr. Gwenlyn Found. You will receive a reminder letter in the mail two months in advance. If you don't receive a letter, please call our office to schedule the follow-up appointment.  If you need a refill on your cardiac medications before your next appointment, please call your pharmacy.

## 2016-10-20 NOTE — Assessment & Plan Note (Signed)
History of hypertension blood pressure measures 120/70. He is on amlodipine. Continue current meds at current dosing

## 2016-10-20 NOTE — Progress Notes (Signed)
     10/20/2016 Edward Freeman   11-Oct-1951  [id number]  Primary Physician No PCP Per Patient Primary Cardiologist: Lorretta Harp MD Renae Gloss  HPI:  Edward Freeman is a 65 year old married African American male father of 2, grandfather to 2 grandchildren he works doing Furniture conservator/restorer.I last saw him in the office 10/21/59. I performed cardiac catheterization on him 11/04/03 which was completely normal. His cardiac risk factors include hypertension as well as family history with a mother who died of a myocardial infarction at age 17. He denies chest pain or shortness of breath. He also had a small apical nodule by CT scan several years ago with recommendations of followup which was never pursued. I've offered him follow-up CT scans which she has declined because of fiscal constraints. Since I saw him a year ago he's had no further episodes of "fluttering in his chest. He denies chest pain or shortness of breath.   Current Outpatient Prescriptions  Medication Sig Dispense Refill  . amLODipine (NORVASC) 10 MG tablet TAKE 1 TABLET BY MOUTH DAILY 30 tablet 11  . aspirin 81 MG tablet Take 81 mg by mouth every other day.      No current facility-administered medications for this visit.     No Known Allergies  Social History   Social History  . Marital status: Single    Spouse name: N/A  . Number of children: N/A  . Years of education: N/A   Occupational History  . Not on file.   Social History Main Topics  . Smoking status: Never Smoker  . Smokeless tobacco: Never Used  . Alcohol use No     Comment: 1-2 beers a month  . Drug use: No  . Sexual activity: Not on file   Other Topics Concern  . Not on file   Social History Narrative  . No narrative on file     Review of Systems: General: negative for chills, fever, night sweats or weight changes.  Cardiovascular: negative for chest pain, dyspnea on exertion, edema, orthopnea, palpitations, paroxysmal  nocturnal dyspnea or shortness of breath Dermatological: negative for rash Respiratory: negative for cough or wheezing Urologic: negative for hematuria Abdominal: negative for nausea, vomiting, diarrhea, bright red blood per rectum, melena, or hematemesis Neurologic: negative for visual changes, syncope, or dizziness All other systems reviewed and are otherwise negative except as noted above.    Blood pressure 120/70, pulse (!) 51, height 5' 10.5" (1.791 m), weight 192 lb (87.1 kg).  General appearance: alert and no distress Neck: no adenopathy, no carotid bruit, no JVD, supple, symmetrical, trachea midline and thyroid not enlarged, symmetric, no tenderness/mass/nodules Lungs: clear to auscultation bilaterally Heart: regular rate and rhythm, S1, S2 normal, no murmur, click, rub or gallop Extremities: extremities normal, atraumatic, no cyanosis or edema  EKG sinus bradycardia 51 without ST or T-wave changes. I personally reviewed this EKG  ASSESSMENT AND PLAN:   Essential hypertension History of hypertension blood pressure measures 120/70. He is on amlodipine. Continue current meds at current dosing  Hyperlipidemia History of hyperlipidemia not on statin therapy. We will recheck a lipid and liver profile      Lorretta Harp MD [facility], [facility] 10/20/2016 10:52 AM

## 2016-10-21 LAB — HEMOGLOBIN A1C
Hgb A1c MFr Bld: 5.4 % (ref <5.7)
Mean Plasma Glucose: 108 mg/dL

## 2016-10-22 ENCOUNTER — Telehealth: Payer: Self-pay | Admitting: Cardiovascular Disease

## 2016-10-22 DIAGNOSIS — R911 Solitary pulmonary nodule: Secondary | ICD-10-CM

## 2016-10-22 NOTE — Telephone Encounter (Signed)
Returned call to [employer] [employer] new order for CT w/o contrast.  New order placed and verified with [facility], correct order received.

## 2016-10-22 NOTE — Telephone Encounter (Signed)
New message      Calling to ask Dr Gwenlyn Found to change CT angio chest order in computer to CT chest without.  He has a lung nodule and this is the test for this diagnosis.  If any questions, please call

## 2016-10-25 ENCOUNTER — Encounter: Payer: Self-pay | Admitting: Cardiovascular Disease

## 2016-12-07 ENCOUNTER — Other Ambulatory Visit: Payer: Self-pay | Admitting: Cardiovascular Disease

## 2016-12-07 NOTE — Telephone Encounter (Signed)
Rx(s) sent to pharmacy electronically.  

## 2017-04-26 ENCOUNTER — Other Ambulatory Visit: Payer: Self-pay | Admitting: *Deleted

## 2017-04-26 MED ORDER — AMLODIPINE BESYLATE 10 MG PO TABS: 10.0000 mg | ORAL_TABLET | Freq: Every day | ORAL | 3 refills | Status: DC

## 2017-05-03 ENCOUNTER — Other Ambulatory Visit: Payer: Self-pay | Admitting: *Deleted

## 2017-05-03 MED ORDER — AMLODIPINE BESYLATE 10 MG PO TABS: 10.0000 mg | ORAL_TABLET | Freq: Every day | ORAL | 3 refills | Status: DC

## 2017-05-30 ENCOUNTER — Encounter: Payer: Self-pay | Admitting: Adult Health

## 2017-05-30 ENCOUNTER — Ambulatory Visit (INDEPENDENT_AMBULATORY_CARE_PROVIDER_SITE_OTHER): Payer: Medicare HMO | Admitting: Adult Health

## 2017-05-30 VITALS — BP 130/70 | Temp 98.3°F | Ht 68.25 in | Wt 192.0 lb

## 2017-05-30 DIAGNOSIS — Z1211 Encounter for screening for malignant neoplasm of colon: Secondary | ICD-10-CM

## 2017-05-30 DIAGNOSIS — N138 Other obstructive and reflux uropathy: Secondary | ICD-10-CM | POA: Diagnosis not present

## 2017-05-30 DIAGNOSIS — I10 Essential (primary) hypertension: Secondary | ICD-10-CM

## 2017-05-30 DIAGNOSIS — Z23 Encounter for immunization: Secondary | ICD-10-CM | POA: Diagnosis not present

## 2017-05-30 DIAGNOSIS — N401 Enlarged prostate with lower urinary tract symptoms: Secondary | ICD-10-CM

## 2017-05-30 DIAGNOSIS — Z7689 Persons encountering health services in other specified circumstances: Secondary | ICD-10-CM | POA: Diagnosis not present

## 2017-05-30 MED ORDER — FINASTERIDE 5 MG PO TABS: 5.0000 mg | ORAL_TABLET | Freq: Every day | ORAL | 3 refills | Status: DC

## 2017-05-30 NOTE — Addendum Note (Signed)
Addended by: Miles Costain T on: 05/30/2017 01:38 PM   Modules accepted: Orders

## 2017-05-30 NOTE — Progress Notes (Addendum)
Patient presents to clinic today to establish care. He is a pleasant 66 year old male who  has a past medical history of Dyspnea on exertion; Hypertension (01/14/10); Nodule of left lung; Normal coronary arteries; Other malaise and fatigue; and Syncope and collapse.   Acute Concerns: Establish Care    BPH - Over the last six months. He reports that he has decreased stream and feels as though he is not emptying his bladder completely. He is getting up in the middle of the night multiple times to urinate. In the past his PSA has been slightly elevated   Chronic Issues: Essential Hypertension - He takes Norvasc 10 mg   Migraines - has not had any in numerous years.   Hyperlipidemia - boarder line hyperlipidemia - not on any medications   Health Maintenance: Dental -- Does not do routine care Vision -- Does not do routine care  Immunizations -- Needs prevnar 23  Colonoscopy -- needs to have  Diet : He does not eat a healthy diet.  Exercise: Does not exercise on a regular basis   He is followed by  Cardiology - Dr. Gwenlyn Found   Past Medical History:  Diagnosis Date  . Dyspnea on exertion   . Hypertension 01/14/10   myocardial imaging- no evidence of ischemia or infarction. EF 53%  . Nodule of left lung   . Normal coronary arteries    by cardiac catheterization which I performed 11/04/03  . Other malaise and fatigue   . Syncope and collapse     No past surgical history on file.  Current Outpatient Prescriptions on File Prior to Visit  Medication Sig Dispense Refill  . amLODipine (NORVASC) 10 MG tablet Take 1 tablet (10 mg total) by mouth daily. 90 tablet 3  . aspirin 81 MG tablet Take 81 mg by mouth every other day.      No current facility-administered medications on file prior to visit.     No Known Allergies  Family History  Problem Relation Age of Onset  . Hypertension Mother   . Stroke Mother   . Alcohol abuse Father   . Diabetes Sister   . Diabetes Brother     . Diabetes Sister     Social History   Social History  . Marital status: Single    Spouse name: N/A  . Number of children: N/A  . Years of education: N/A   Occupational History  . Not on file.   Social History Main Topics  . Smoking status: Never Smoker  . Smokeless tobacco: Never Used  . Alcohol use No     Comment: 1-2 beers a month  . Drug use: No  . Sexual activity: Not on file   Other Topics Concern  . Not on file   Social History Narrative  . No narrative on file    Review of Systems  Constitutional: Negative.   HENT: Negative.   Eyes: Negative.   Respiratory: Negative.   Cardiovascular: Negative.   Gastrointestinal: Negative.   Genitourinary: Negative.   Musculoskeletal: Negative.   Skin: Negative.   Neurological: Negative.   Endo/Heme/Allergies: Negative.   Psychiatric/Behavioral: Negative.   All other systems reviewed and are negative.   Temp 98.3 F (36.8 C) (Oral)   Ht 5' 8.25" (1.734 m)   Wt 192 lb (87.1 kg)   BMI 28.98 kg/m   Physical Exam  Constitutional: He is oriented to person, place, and time and well-developed, well-nourished, and in no distress. No distress.  HENT:  Head: Normocephalic and atraumatic.  Right Ear: External ear normal.  Left Ear: External ear normal.  Nose: Nose normal.  Mouth/Throat: Oropharynx is clear and moist. No oropharyngeal exudate.  Eyes: Pupils are equal, round, and reactive to light. Conjunctivae and EOM are normal. Right eye exhibits no discharge. Left eye exhibits no discharge. No scleral icterus.  Neck: Normal range of motion. Neck supple. No JVD present. No tracheal deviation present. No thyromegaly present.  Cardiovascular: Normal rate, regular rhythm, normal heart sounds and intact distal pulses.  Exam reveals no gallop and no friction rub.   No murmur heard. Pulmonary/Chest: Effort normal and breath sounds normal. No stridor. No respiratory distress. He has no wheezes. He has no rales. He exhibits no  tenderness.  Abdominal: Soft. Bowel sounds are normal. He exhibits no distension and no mass. There is no tenderness. There is no rebound and no guarding.  Genitourinary: Prostate is enlarged.  Musculoskeletal: Normal range of motion. He exhibits no edema, tenderness or deformity.  Lymphadenopathy:    He has no cervical adenopathy.  Neurological: He is alert and oriented to person, place, and time. He displays normal reflexes. No cranial nerve deficit. He exhibits normal muscle tone. Coordination normal. GCS score is 15.  Skin: Skin is warm and dry. No rash noted. He is not diaphoretic. No erythema. No pallor.  Psychiatric: Mood, memory, affect and judgment normal.  Nursing note and vitals reviewed.   Assessment/Plan: 1. Encounter to establish care-  - Follow up in December for CPE - Follow up sooner if needed - Needs to start exercising and eating healthy  - Prevnar 23 given at todays exam   2. Essential hypertension - Well controlled. No change in medication at this time   3. Colon cancer screening  - Ambulatory referral to Gastroenterology  4. BPH with urinary obstruction  - finasteride (PROSCAR) 5 MG tablet; Take 1 tablet (5 mg total) by mouth daily.  Dispense: 90 tablet; Refill: 3   Dorothyann Peng, NP

## 2017-05-30 NOTE — Patient Instructions (Signed)
It was great meeting you today   I have sent in a medication called Proscar for your urinary symptoms.   Please follow up with me in December for your next physical   Health Maintenance, Male A healthy lifestyle and preventative care can promote health and wellness.  Maintain regular health, dental, and eye exams.  Eat a healthy diet. Foods like vegetables, fruits, whole grains, low-fat dairy products, and lean protein foods contain the nutrients you need and are low in calories. Decrease your intake of foods high in solid fats, added sugars, and salt. Get information about a proper diet from your health care provider, if necessary.  Regular physical exercise is one of the most important things you can do for your health. Most adults should get at least 150 minutes of moderate-intensity exercise (any activity that increases your heart rate and causes you to sweat) each week. In addition, most adults need muscle-strengthening exercises on 2 or more days a week.   Maintain a healthy weight. The body mass index (BMI) is a screening tool to identify possible weight problems. It provides an estimate of body fat based on height and weight. Your health care provider can find your BMI and can help you achieve or maintain a healthy weight. For males 20 years and older:  A BMI below 18.5 is considered underweight.  A BMI of 18.5 to 24.9 is normal.  A BMI of 25 to 29.9 is considered overweight.  A BMI of 30 and above is considered obese.  Maintain normal blood lipids and cholesterol by exercising and minimizing your intake of saturated fat. Eat a balanced diet with plenty of fruits and vegetables. Blood tests for lipids and cholesterol should begin at age 1 and be repeated every 5 years. If your lipid or cholesterol levels are high, you are over age 108, or you are at high risk for heart disease, you may need your cholesterol levels checked more frequently.Ongoing high lipid and cholesterol levels  should be treated with medicines if diet and exercise are not working.  If you smoke, find out from your health care provider how to quit. If you do not use tobacco, do not start.  Lung cancer screening is recommended for adults aged 63-80 years who are at high risk for developing lung cancer because of a history of smoking. A yearly low-dose CT scan of the lungs is recommended for people who have at least a 30-pack-year history of smoking and are current smokers or have quit within the past 15 years. A pack year of smoking is smoking an average of 1 pack of cigarettes a day for 1 year (for example, a 30-pack-year history of smoking could mean smoking 1 pack a day for 30 years or 2 packs a day for 15 years). Yearly screening should continue until the smoker has stopped smoking for at least 15 years. Yearly screening should be stopped for people who develop a health problem that would prevent them from having lung cancer treatment.  If you choose to drink alcohol, do not have more than 2 drinks per day. One drink is considered to be 12 oz (360 mL) of beer, 5 oz (150 mL) of wine, or 1.5 oz (45 mL) of liquor.  Avoid the use of street drugs. Do not share needles with anyone. Ask for help if you need support or instructions about stopping the use of drugs.  High blood pressure causes heart disease and increases the risk of stroke. High blood pressure is  more likely to develop in:  People who have blood pressure in the end of the normal range (100-139/85-89 mm Hg).  People who are overweight or obese.  People who are African American.  If you are 56-61 years of age, have your blood pressure checked every 3-5 years. If you are 74 years of age or older, have your blood pressure checked every year. You should have your blood pressure measured twice--once when you are at a hospital or clinic, and once when you are not at a hospital or clinic. Record the average of the two measurements. To check your blood  pressure when you are not at a hospital or clinic, you can use:  An automated blood pressure machine at a pharmacy.  A home blood pressure monitor.  If you are 12-33 years old, ask your health care provider if you should take aspirin to prevent heart disease.  Diabetes screening involves taking a blood sample to check your fasting blood sugar level. This should be done once every 3 years after age 34 if you are at a normal weight and without risk factors for diabetes. Testing should be considered at a younger age or be carried out more frequently if you are overweight and have at least 1 risk factor for diabetes.  Colorectal cancer can be detected and often prevented. Most routine colorectal cancer screening begins at the age of 72 and continues through age 72. However, your health care provider may recommend screening at an earlier age if you have risk factors for colon cancer. On a yearly basis, your health care provider may provide home test kits to check for hidden blood in the stool. A small camera at the end of a tube may be used to directly examine the colon (sigmoidoscopy or colonoscopy) to detect the earliest forms of colorectal cancer. Talk to your health care provider about this at age 87 when routine screening begins. A direct exam of the colon should be repeated every 5-10 years through age 32, unless early forms of precancerous polyps or small growths are found.  People who are at an increased risk for hepatitis B should be screened for this virus. You are considered at high risk for hepatitis B if:  You were born in a country where hepatitis B occurs often. Talk with your health care provider about which countries are considered high risk.  Your parents were born in a high-risk country and you have not received a shot to protect against hepatitis B (hepatitis B vaccine).  You have HIV or AIDS.  You use needles to inject street drugs.  You live with, or have sex with, someone who  has hepatitis B.  You are a man who has sex with other men (MSM).  You get hemodialysis treatment.  You take certain medicines for conditions like cancer, organ transplantation, and autoimmune conditions.  Hepatitis C blood testing is recommended for all people born from [birth year] through 1965 and any individual with known risk factors for hepatitis C.  Healthy men should no longer receive prostate-specific antigen (PSA) blood tests as part of routine cancer screening. Talk to your health care provider about prostate cancer screening.  Testicular cancer screening is not recommended for adolescents or adult males who have no symptoms. Screening includes self-exam, a health care provider exam, and other screening tests. Consult with your health care provider about any symptoms you have or any concerns you have about testicular cancer.  Practice safe sex. Use condoms and avoid high-risk sexual  practices to reduce the spread of sexually transmitted infections (STIs).  You should be screened for STIs, including gonorrhea and chlamydia if:  You are sexually active and are younger than 24 years.  You are older than 24 years, and your health care provider tells you that you are at risk for this type of infection.  Your sexual activity has changed since you were last screened, and you are at an increased risk for chlamydia or gonorrhea. Ask your health care provider if you are at risk.  If you are at risk of being infected with HIV, it is recommended that you take a prescription medicine daily to prevent HIV infection. This is called pre-exposure prophylaxis (PrEP). You are considered at risk if:  You are a man who has sex with other men (MSM).  You are a heterosexual man who is sexually active with multiple partners.  You take drugs by injection.  You are sexually active with a partner who has HIV.  Talk with your health care provider about whether you are at high risk of being infected with  HIV. If you choose to begin PrEP, you should first be tested for HIV. You should then be tested every 3 months for as long as you are taking PrEP.  Use sunscreen. Apply sunscreen liberally and repeatedly throughout the day. You should seek shade when your shadow is shorter than you. Protect yourself by wearing long sleeves, pants, a wide-brimmed hat, and sunglasses year round whenever you are outdoors.  Tell your health care provider of new moles or changes in moles, especially if there is a change in shape or color. Also, tell your health care provider if a mole is larger than the size of a pencil eraser.  A one-time screening for abdominal aortic aneurysm (AAA) and surgical repair of large AAAs by ultrasound is recommended for men aged 80-75 years who are current or former smokers.  Stay current with your vaccines (immunizations).   This information is not intended to replace advice given to you by your health care provider. Make sure you discuss any questions you have with your health care provider.   Document Released: 04/22/2008 Document Revised: 11/15/2014 Document Reviewed: 03/22/2011 Elsevier Interactive Patient Education [employer].

## 2017-06-01 ENCOUNTER — Encounter: Payer: Self-pay | Admitting: Gastroenterology

## 2017-06-07 ENCOUNTER — Ambulatory Visit (AMBULATORY_SURGERY_CENTER): Payer: Self-pay | Admitting: *Deleted

## 2017-06-07 VITALS — Ht 68.5 in | Wt 195.8 lb

## 2017-06-07 DIAGNOSIS — Z1211 Encounter for screening for malignant neoplasm of colon: Secondary | ICD-10-CM

## 2017-06-07 MED ORDER — NA SULFATE-K SULFATE-MG SULF 17.5-3.13-1.6 GM/177ML PO SOLN: 1.0000 | Freq: Once | ORAL | 0 refills | Status: AC

## 2017-06-07 NOTE — Progress Notes (Signed)
Denies allergies to eggs or soy products. Denies complications with sedation or anesthesia. Denies O2 use. Denies use of diet or weight loss medications.  Emmi instructions not given for colonoscopy, pt does not have access to email or Internet  

## 2017-06-09 ENCOUNTER — Encounter: Payer: Self-pay | Admitting: Gastroenterology

## 2017-06-14 ENCOUNTER — Encounter: Payer: Self-pay | Admitting: Gastroenterology

## 2017-06-14 ENCOUNTER — Ambulatory Visit (AMBULATORY_SURGERY_CENTER): Payer: Medicare HMO | Admitting: Gastroenterology

## 2017-06-14 VITALS — BP 129/63 | HR 51 | Temp 98.0°F | Resp 12 | Ht 68.0 in | Wt 185.0 lb

## 2017-06-14 DIAGNOSIS — D125 Benign neoplasm of sigmoid colon: Secondary | ICD-10-CM | POA: Diagnosis not present

## 2017-06-14 DIAGNOSIS — Z1212 Encounter for screening for malignant neoplasm of rectum: Secondary | ICD-10-CM | POA: Diagnosis not present

## 2017-06-14 DIAGNOSIS — D124 Benign neoplasm of descending colon: Secondary | ICD-10-CM

## 2017-06-14 DIAGNOSIS — D127 Benign neoplasm of rectosigmoid junction: Secondary | ICD-10-CM

## 2017-06-14 DIAGNOSIS — Z1211 Encounter for screening for malignant neoplasm of colon: Secondary | ICD-10-CM | POA: Diagnosis present

## 2017-06-14 DIAGNOSIS — K635 Polyp of colon: Secondary | ICD-10-CM

## 2017-06-14 MED ORDER — SODIUM CHLORIDE 0.9 % IV SOLN: 500.0000 mL | INTRAVENOUS | Status: DC

## 2017-06-14 NOTE — Progress Notes (Signed)
Called to room to assist during endoscopic procedure.  Patient ID and intended procedure confirmed with present staff. Received instructions for my participation in the procedure from the performing physician.  

## 2017-06-14 NOTE — Op Note (Signed)
[place] Patient Name: Edward Freeman Procedure Date: 06/14/2017 8:22 AM MRN: [id number] Endoscopist: Mallie Mussel L. Loletha Carrow , MD Age: 66 Referring MD:  Date of Birth: 04/19/1951 Gender: Male Account #: [id number] Procedure:                Colonoscopy Indications:              Screening for colorectal malignant neoplasm, This                            is the patient's first colonoscopy Medicines:                Monitored Anesthesia Care Procedure:                Pre-Anesthesia Assessment:                           - Prior to the procedure, a History and Physical                            was performed, and patient medications and                            allergies were reviewed. The patient's tolerance of                            previous anesthesia was also reviewed. The risks                            and benefits of the procedure and the sedation                            options and risks were discussed with the patient.                            All questions were answered, and informed consent                            was obtained. Anticoagulants: The patient has taken                            aspirin. It was decided not to withhold this                            medication prior to the procedure. ASA Grade                            Assessment: II - A patient with mild systemic                            disease. After reviewing the risks and benefits,                            the patient was deemed in satisfactory condition to  undergo the procedure.                           After obtaining informed consent, the colonoscope                            was passed under direct vision. Throughout the                            procedure, the patient's blood pressure, pulse, and                            oxygen saturations were monitored continuously. The                            Colonoscope was introduced through the anus and                       advanced to the the cecum, identified by                            appendiceal orifice and ileocecal valve. The                            colonoscopy was performed without difficulty. The                            patient tolerated the procedure well. The quality                            of the bowel preparation was excellent. The                            ileocecal valve, appendiceal orifice, and rectum                            were photographed. The quality of the bowel                            preparation was evaluated using the BBPS [facility]                            Bowel Preparation Scale) with scores of: Right                            Colon = 3, Transverse Colon = 3 and Left Colon = 2.                            The total BBPS score equals 8. The bowel                            preparation used was SUPREP. Scope In: 9:03:26 AM Scope Out: 3:54:56 AM Scope Withdrawal Time: 0 hours 9 minutes 37 seconds  Total Procedure Duration: 0 hours 12 minutes 51 seconds  Findings:                 The perianal and digital rectal examinations were                            normal.                           Multiple diverticula were found in the entire colon.                           Two sessile polyps were found in the recto-sigmoid                            colon and descending colon. The polyps were 4 mm in                            size. These polyps were removed with a cold snare.                            Resection and retrieval were complete.                           The exam was otherwise without abnormality on                            direct and retroflexion views. Complications:            No immediate complications. Estimated Blood Loss:     Estimated blood loss: none. Impression:               - Diverticulosis in the entire examined colon.                           - Two 4 mm polyps at the recto-sigmoid colon and in                            the  descending colon, removed with a cold snare.                            Resected and retrieved.                           - The examination was otherwise normal on direct                            and retroflexion views. Recommendation:           - Patient has a contact number available for                            emergencies. The signs and symptoms of potential                            delayed complications were discussed with the  patient. Return to normal activities tomorrow.                            Written discharge instructions were provided to the                            patient.                           - Resume previous diet.                           - No aspirin, ibuprofen, naproxen, or other                            non-steroidal anti-inflammatory drugs for 5 days                            after polyp removal.                           - Await pathology results.                           - Repeat colonoscopy is recommended for                            surveillance. The colonoscopy date will be                            determined after pathology results from today's                            exam become available for review. Leary Mcnulty L. Loletha Carrow, MD 06/14/2017 9:21:17 AM This report has been signed electronically.

## 2017-06-14 NOTE — Progress Notes (Signed)
Pt's states no medical or surgical changes since previsit or office visit. 

## 2017-06-14 NOTE — Patient Instructions (Signed)
YOU HAD AN ENDOSCOPIC PROCEDURE TODAY AT [facility]:   Refer to the procedure report that was given to you for any specific questions about what was found during the examination.  If the procedure report does not answer your questions, please call your gastroenterologist to clarify.  If you requested that your care partner not be given the details of your procedure findings, then the procedure report has been included in a sealed envelope for you to review at your convenience later.  YOU SHOULD EXPECT: Some feelings of bloating in the abdomen. Passage of more gas than usual.  Walking can help get rid of the air that was put into your GI tract during the procedure and reduce the bloating. If you had a lower endoscopy (such as a colonoscopy or flexible sigmoidoscopy) you may notice spotting of blood in your stool or on the toilet paper. If you underwent a bowel prep for your procedure, you may not have a normal bowel movement for a few days.  Please Note:  You might notice some irritation and congestion in your nose or some drainage.  This is from the oxygen used during your procedure.  There is no need for concern and it should clear up in a day or so.  SYMPTOMS TO REPORT IMMEDIATELY:   Following lower endoscopy (colonoscopy or flexible sigmoidoscopy):  Excessive amounts of blood in the stool  Significant tenderness or worsening of abdominal pains  Swelling of the abdomen that is new, acute  Fever of 100F or higher   For urgent or emergent issues, a gastroenterologist can be reached at any hour by calling [phone]. Please read all handouts given to you by your recovery nurse today. No NSAIDS, ibuprofen,motrin aleve or aspirin products in 5 days.   DIET:  We do recommend a small meal at first, but then you may proceed to your regular diet.  Drink plenty of fluids but you should avoid alcoholic beverages for 24 hours.  ACTIVITY:  You should plan to take it easy for the  rest of today and you should NOT DRIVE or use heavy machinery until tomorrow (because of the sedation medicines used during the test).    FOLLOW UP: Our staff will call the number listed on your records the next business day following your procedure to check on you and address any questions or concerns that you may have regarding the information given to you following your procedure. If we do not reach you, we will leave a message.  However, if you are feeling well and you are not experiencing any problems, there is no need to return our call.  We will assume that you have returned to your regular daily activities without incident.  If any biopsies were taken you will be contacted by phone or by letter within the next 1-3 weeks.  Please call us at [phone] if you have not heard about the biopsies in 3 weeks.    SIGNATURES/CONFIDENTIALITY: You and/or your care partner have signed paperwork which will be entered into your electronic medical record.  These signatures attest to the fact that that the information above on your After Visit Summary has been reviewed and is understood.  Full responsibility of the confidentiality of this discharge information lies with you and/or your care-partner.  Thank you for letting us take care of your healthcare needs today.

## 2017-06-14 NOTE — Progress Notes (Signed)
Spontaneous respirations throughout. VSS. Resting comfortably. To PACU on room air. Report to  Sara RN. 

## 2017-06-15 ENCOUNTER — Telehealth: Payer: Self-pay | Admitting: *Deleted

## 2017-06-15 NOTE — Telephone Encounter (Signed)
No answer, left message to call if questions or concerns. 

## 2017-06-15 NOTE — Telephone Encounter (Signed)
Second call back.  No answer. Left message to call if questions or concerns.

## 2017-06-20 ENCOUNTER — Encounter: Payer: Self-pay | Admitting: Gastroenterology

## 2017-10-21 ENCOUNTER — Encounter: Payer: Self-pay | Admitting: Cardiovascular Disease

## 2017-10-21 ENCOUNTER — Ambulatory Visit: Payer: Medicare HMO | Admitting: Cardiovascular Disease

## 2017-10-21 VITALS — BP 142/80 | HR 62 | Ht 70.5 in | Wt 200.6 lb

## 2017-10-21 DIAGNOSIS — E785 Hyperlipidemia, unspecified: Secondary | ICD-10-CM | POA: Diagnosis not present

## 2017-10-21 DIAGNOSIS — R002 Palpitations: Secondary | ICD-10-CM

## 2017-10-21 DIAGNOSIS — R0602 Shortness of breath: Secondary | ICD-10-CM | POA: Diagnosis not present

## 2017-10-21 DIAGNOSIS — R079 Chest pain, unspecified: Secondary | ICD-10-CM | POA: Diagnosis not present

## 2017-10-21 NOTE — Assessment & Plan Note (Signed)
Edward Freeman complains of recent episodes of atypical chest pain which increased in frequency and severity. They are associated with dyspnea as well. I'm going to obtain an exercise Myoview stress test and 2-D echocardiogram to further evaluate.

## 2017-10-21 NOTE — Assessment & Plan Note (Signed)
Edward Freeman complains of tachycardia palpitations that have been more frequent lately. They can last up to an hour time. He does get somewhat dizzy and short of breath during these episodes. They'll leave him feeling weak and washed out afterwards. I'm going to obtain a 30 day event monitor to further evaluate.

## 2017-10-21 NOTE — Addendum Note (Signed)
Addended by: Zebedee Iba on: 10/21/2017 04:20 PM   Modules accepted: Orders

## 2017-10-21 NOTE — Progress Notes (Signed)
10/21/2017 Edward Freeman   05/05/1951  [id number]  Primary Physician Carlisle Cater, Tommi Rumps, NP Primary Cardiologist: Lorretta Harp MD [name], [place]  HPI:  Edward Freeman is a 66 y.o.  married African American male father of 2, grandfather to 2 grandchildren he works doing Furniture conservator/restorer.I last saw him in the office  10/20/16. I performed cardiac catheterization on him 11/04/03 which was completely normal. His cardiac risk factors include hypertension as well as family history with a mother who died of a myocardial infarction at age 60. He denies chest pain or shortness of breath. He also had a small apical nodule by CT scan several years ago with recommendations of followup which was never pursued. I've offered him follow-up CT scans which she has declined because of fiscal constraints. Since I saw him a year ago he has complained of increasing frequency of episodes of tachycardia palpitations along with atypical chest pain and dyspnea on exertion.     Current Meds  Medication Sig  . amLODipine (NORVASC) 10 MG tablet Take 1 tablet (10 mg total) by mouth daily.  Marland Kitchen aspirin 81 MG tablet Take 81 mg by mouth every other day.   . finasteride (PROSCAR) 5 MG tablet Take 1 tablet (5 mg total) by mouth daily.   Current Facility-Administered Medications for the 10/21/17 encounter (Office Visit) with Lorretta Harp, MD  Medication  . 0.9 %  sodium chloride infusion     No Known Allergies  Social History   Socioeconomic History  . Marital status: Single    Spouse name: Not on file  . Number of children: Not on file  . Years of education: Not on file  . Highest education level: Not on file  Social Needs  . Financial resource strain: Not on file  . Food insecurity - worry: Not on file  . Food insecurity - inability: Not on file  . Transportation needs - medical: Not on file  . Transportation needs - non-medical: Not on file  Occupational History  . Not on file    Tobacco Use  . Smoking status: Never Smoker  . Smokeless tobacco: Never Used  Substance and Sexual Activity  . Alcohol use: No    Comment: 1-2 beers a month  . Drug use: No  . Sexual activity: Not on file  Other Topics Concern  . Not on file  Social History Narrative  . Not on file     Review of Systems: General: negative for chills, fever, night sweats or weight changes.  Cardiovascular: negative for chest pain, dyspnea on exertion, edema, orthopnea, palpitations, paroxysmal nocturnal dyspnea or shortness of breath Dermatological: negative for rash Respiratory: negative for cough or wheezing Urologic: negative for hematuria Abdominal: negative for nausea, vomiting, diarrhea, bright red blood per rectum, melena, or hematemesis Neurologic: negative for visual changes, syncope, or dizziness All other systems reviewed and are otherwise negative except as noted above.    Blood pressure (!) 142/80, pulse 62, height 5' 10.5" (1.791 m), weight 200 lb 9.6 oz (91 kg).  General appearance: alert and no distress Neck: no adenopathy, no carotid bruit, no JVD, supple, symmetrical, trachea midline and thyroid not enlarged, symmetric, no tenderness/mass/nodules Lungs: clear to auscultation bilaterally Heart: regular rate and rhythm, S1, S2 normal, no murmur, click, rub or gallop Extremities: extremities normal, atraumatic, no cyanosis or edema Pulses: 2+ and symmetric Skin: Skin color, texture, turgor normal. No rashes or lesions Neurologic: Alert and oriented X 3, normal  strength and tone. Normal symmetric reflexes. Normal coordination and gait  EKG sinus rhythm at 62 without ST or T-wave changes. I personally reviewed this EKG.  ASSESSMENT AND PLAN:   Essential hypertension History of essential hypertension blood pressure medically 142/80. He is on amlodipine. Continue current meds at current dosing.  Hyperlipidemia History of hyperlipidemia not on statin therapy with lipid profile  performed a year ago revealing a LDL 119. We will recheck a lipid and liver profile.  Atypical chest pain Mr. Camerer complains of recent episodes of atypical chest pain which increased in frequency and severity. They are associated with dyspnea as well. I'm going to obtain an exercise Myoview stress test and 2-D echocardiogram to further evaluate.  Palpitations Mr. Mollett complains of tachycardia palpitations that have been more frequent lately. They can last up to an hour time. He does get somewhat dizzy and short of breath during these episodes. They'll leave him feeling weak and washed out afterwards. I'm going to obtain a 30 day event monitor to further evaluate.      Lorretta Harp MD FACP,FACC,FAHA, [facility] 10/21/2017 10:12 AM

## 2017-10-21 NOTE — Assessment & Plan Note (Signed)
History of hyperlipidemia not on statin therapy with lipid profile performed a year ago revealing a LDL 119. We will recheck a lipid and liver profile.

## 2017-10-21 NOTE — Patient Instructions (Signed)
Medication Instructions: Your physician recommends that you continue on your current medications as directed. Please refer to the Current Medication list given to you today.  Labwork: Your physician recommends that you return for a FASTING lipid profile and hepatic function panel at your earliest convenience.   Testing/Procedures: Your physician has requested that you have an echocardiogram. Echocardiography is a painless test that uses sound waves to create images of your heart. It provides your doctor with information about the size and shape of your heart and how well your heart's chambers and valves are working. This procedure takes approximately one hour. There are no restrictions for this procedure.  Your physician has recommended that you wear a 30 day event monitor. Event monitors are medical devices that record the heart's electrical activity. Doctors most often [place] these monitors to diagnose arrhythmias. Arrhythmias are problems with the speed or rhythm of the heartbeat. The monitor is a small, portable device. You can wear one while you do your normal daily activities. This is usually used to diagnose what is causing palpitations/syncope (passing out).  Your physician has requested that you have an exercise stress myoview. For further information please visit [email or web address]. Please follow instruction sheet, as given.  Follow-Up: Your physician recommends that you schedule a follow-up appointment in: 6-8 weeks with Dr. Gwenlyn Found.  If you need a refill on your cardiac medications before your next appointment, please call your pharmacy.

## 2017-10-21 NOTE — Assessment & Plan Note (Signed)
History of essential hypertension blood pressure medically 142/80. He is on amlodipine. Continue current meds at current dosing.

## 2017-10-22 LAB — LIPID PANEL
Chol/HDL Ratio: 3.1 ratio (ref 0.0–5.0)
Cholesterol, Total: 203 mg/dL — ABNORMAL HIGH (ref 100–199)
HDL: 65 mg/dL
LDL Calculated: 125 mg/dL — ABNORMAL HIGH (ref 0–99)
Triglycerides: 64 mg/dL (ref 0–149)
VLDL Cholesterol Cal: 13 mg/dL (ref 5–40)

## 2017-10-22 LAB — HEPATIC FUNCTION PANEL
ALBUMIN: 4.4 g/dL (ref 3.6–4.8)
ALT: 25 IU/L (ref 0–44)
AST: 22 IU/L (ref 0–40)
Alkaline Phosphatase: 60 IU/L (ref 39–117)
BILIRUBIN TOTAL: 1 mg/dL (ref 0.0–1.2)
BILIRUBIN, DIRECT: 0.21 mg/dL (ref 0.00–0.40)
Total Protein: 7 g/dL (ref 6.0–8.5)

## 2017-10-24 ENCOUNTER — Other Ambulatory Visit: Payer: Self-pay | Admitting: Cardiovascular Disease

## 2017-10-24 ENCOUNTER — Encounter: Payer: Self-pay | Admitting: Cardiovascular Disease

## 2017-10-24 DIAGNOSIS — E785 Hyperlipidemia, unspecified: Secondary | ICD-10-CM

## 2017-11-09 ENCOUNTER — Telehealth (HOSPITAL_COMMUNITY): Payer: Self-pay

## 2017-11-09 NOTE — Telephone Encounter (Signed)
Encounter complete. 

## 2017-11-10 ENCOUNTER — Ambulatory Visit (HOSPITAL_COMMUNITY): Payer: Medicare HMO | Attending: Cardiology

## 2017-11-10 ENCOUNTER — Other Ambulatory Visit: Payer: Self-pay

## 2017-11-10 ENCOUNTER — Ambulatory Visit (INDEPENDENT_AMBULATORY_CARE_PROVIDER_SITE_OTHER): Payer: Medicare HMO

## 2017-11-10 DIAGNOSIS — Z8249 Family history of ischemic heart disease and other diseases of the circulatory system: Secondary | ICD-10-CM | POA: Diagnosis not present

## 2017-11-10 DIAGNOSIS — R079 Chest pain, unspecified: Secondary | ICD-10-CM

## 2017-11-10 DIAGNOSIS — I083 Combined rheumatic disorders of mitral, aortic and tricuspid valves: Secondary | ICD-10-CM | POA: Insufficient documentation

## 2017-11-10 DIAGNOSIS — R002 Palpitations: Secondary | ICD-10-CM | POA: Diagnosis not present

## 2017-11-10 DIAGNOSIS — I119 Hypertensive heart disease without heart failure: Secondary | ICD-10-CM | POA: Diagnosis not present

## 2017-11-10 DIAGNOSIS — R0602 Shortness of breath: Secondary | ICD-10-CM

## 2017-11-11 ENCOUNTER — Ambulatory Visit (HOSPITAL_COMMUNITY)
Admission: RE | Admit: 2017-11-11 | Discharge: 2017-11-11 | Disposition: A | Discharge status: Home / Self Care | Payer: Medicare HMO | Source: Ambulatory Visit | Attending: Cardiovascular Disease | Admitting: Cardiovascular Disease

## 2017-11-11 DIAGNOSIS — R5383 Other fatigue: Secondary | ICD-10-CM | POA: Diagnosis not present

## 2017-11-11 DIAGNOSIS — R42 Dizziness and giddiness: Secondary | ICD-10-CM | POA: Diagnosis not present

## 2017-11-11 DIAGNOSIS — Z8249 Family history of ischemic heart disease and other diseases of the circulatory system: Secondary | ICD-10-CM | POA: Diagnosis not present

## 2017-11-11 DIAGNOSIS — I10 Essential (primary) hypertension: Secondary | ICD-10-CM | POA: Diagnosis not present

## 2017-11-11 DIAGNOSIS — R0609 Other forms of dyspnea: Secondary | ICD-10-CM | POA: Insufficient documentation

## 2017-11-11 DIAGNOSIS — R0602 Shortness of breath: Secondary | ICD-10-CM | POA: Insufficient documentation

## 2017-11-11 DIAGNOSIS — R079 Chest pain, unspecified: Secondary | ICD-10-CM | POA: Diagnosis not present

## 2017-11-11 DIAGNOSIS — R002 Palpitations: Secondary | ICD-10-CM | POA: Diagnosis not present

## 2017-11-11 DIAGNOSIS — I2510 Atherosclerotic heart disease of native coronary artery without angina pectoris: Secondary | ICD-10-CM | POA: Insufficient documentation

## 2017-11-11 LAB — MYOCARDIAL PERFUSION IMAGING
CHL CUP NUCLEAR SRS: 0
CHL CUP NUCLEAR SSS: 1
CSEPED: 11 min
CSEPEDS: 6 s
Estimated workload: 11.8 METS
LV dias vol: 110 mL (ref 62–150)
LVSYSVOL: 52 mL
MPHR: 154 {beats}/min
Peak HR: 133 {beats}/min
Percent HR: 86 %
RPE: 17
Rest HR: 46 {beats}/min
SDS: 1
TID: 0.85

## 2017-11-11 MED ORDER — TECHNETIUM TC 99M TETROFOSMIN IV KIT
9.8000 | PACK | Freq: Once | INTRAVENOUS | Status: AC | PRN
  Administered 2017-11-11: 9.8 via INTRAVENOUS
  Filled 2017-11-11: qty 10

## 2017-11-11 MED ORDER — TECHNETIUM TC 99M TETROFOSMIN IV KIT
29.4000 | PACK | Freq: Once | INTRAVENOUS | Status: AC | PRN
  Administered 2017-11-11: 29.4 via INTRAVENOUS
  Filled 2017-11-11: qty 30

## 2017-12-14 ENCOUNTER — Encounter (INDEPENDENT_AMBULATORY_CARE_PROVIDER_SITE_OTHER): Payer: Self-pay

## 2017-12-14 ENCOUNTER — Encounter: Payer: Self-pay | Admitting: Cardiovascular Disease

## 2017-12-14 ENCOUNTER — Ambulatory Visit: Payer: Medicare HMO | Admitting: Cardiovascular Disease

## 2017-12-14 DIAGNOSIS — R002 Palpitations: Secondary | ICD-10-CM

## 2017-12-14 DIAGNOSIS — R0609 Other forms of dyspnea: Secondary | ICD-10-CM

## 2017-12-14 DIAGNOSIS — R0789 Other chest pain: Secondary | ICD-10-CM | POA: Diagnosis not present

## 2017-12-14 NOTE — Patient Instructions (Signed)
Medication Instructions: Your physician recommends that you continue on your current medications as directed. Please refer to the Current Medication list given to you today.  Follow-Up: You have referred to Dr. Curt Bears in Cardiac Electrophysiology.  Your physician wants you to follow-up in: 6 months with Dr. Gwenlyn Found. You will receive a reminder letter in the mail two months in advance. If you don't receive a letter, please call our office to schedule the follow-up appointment.  If you need a refill on your cardiac medications before your next appointment, please call your pharmacy.

## 2017-12-14 NOTE — Progress Notes (Signed)
Edward Freeman returns today for review of his recent outpatient studies performed in evaluation of atypical chest pain, dyspnea on exertion and palpitations. His Myoview stress test and 2-D echo were entirely normal. 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 He does complain of symptoms related to this with dizziness and "presyncope". I am going to refer him to Dr. Curt Bears for EP evaluation.  Lorretta Harp, M.D., [place], [facility], [place] [address] [name], [place]  [address]  [phone] 12/14/2017 10:18 AM

## 2017-12-14 NOTE — Assessment & Plan Note (Signed)
History of atypical chest pain with recent Myoview performed 11/11/17 which was entirely normal.

## 2017-12-14 NOTE — Assessment & Plan Note (Signed)
History of dyspnea on exertion with recent 2-D echo performed 11/10/17 which was entirely normal.

## 2017-12-14 NOTE — Addendum Note (Signed)
Addended by: Therisa Doyne on: 12/14/2017 10:24 AM   Modules accepted: Orders

## 2017-12-14 NOTE — Assessment & Plan Note (Signed)
History of symptomatic palpitations with recent event monitor that showed an episode of PSVT. I suspect this is what he's feeling. He happens periodically and is associated with dizziness and presyncope. I am going to refer him to Dr. Curt Bears for EP evaluation.

## 2018-01-17 ENCOUNTER — Encounter: Payer: Self-pay | Admitting: Adult Health

## 2018-01-17 ENCOUNTER — Ambulatory Visit (INDEPENDENT_AMBULATORY_CARE_PROVIDER_SITE_OTHER): Payer: Medicare HMO | Admitting: Adult Health

## 2018-01-17 VITALS — Temp 98.2°F | Wt 196.0 lb

## 2018-01-17 DIAGNOSIS — N529 Male erectile dysfunction, unspecified: Secondary | ICD-10-CM | POA: Diagnosis not present

## 2018-01-17 DIAGNOSIS — K5900 Constipation, unspecified: Secondary | ICD-10-CM

## 2018-01-17 DIAGNOSIS — M549 Dorsalgia, unspecified: Secondary | ICD-10-CM

## 2018-01-17 LAB — POC URINALSYSI DIPSTICK (AUTOMATED)
Bilirubin, UA: NEGATIVE
GLUCOSE UA: NEGATIVE
Ketones, UA: NEGATIVE
Leukocytes, UA: NEGATIVE
NITRITE UA: NEGATIVE
PH UA: 6 (ref 5.0–8.0)
Protein, UA: NEGATIVE
RBC UA: NEGATIVE
Spec Grav, UA: 1.025 (ref 1.010–1.025)
UROBILINOGEN UA: 0.2 U/dL

## 2018-01-17 MED ORDER — CYCLOBENZAPRINE HCL 10 MG PO TABS: 10.0000 mg | ORAL_TABLET | Freq: Every day | ORAL | 0 refills | Status: DC

## 2018-01-17 MED ORDER — METHYLPREDNISOLONE 4 MG PO TBPK: ORAL_TABLET | ORAL | 0 refills | Status: DC

## 2018-01-17 MED ORDER — TADALAFIL 20 MG PO TABS: 20.0000 mg | ORAL_TABLET | Freq: Every day | ORAL | 2 refills | Status: DC | PRN

## 2018-01-17 NOTE — Progress Notes (Signed)
   Subjective:    Patient ID: Edward Freeman, male    DOB: 01/09/1951, 67 y.o.   MRN: [id number]  HPI Presents with 2 weeks of low back pain with radiation to left leg and calf and left groin.  He describes some of the pain as cramping that prevents him from getting comfortable while sleeping. He denies recent injury to his back. He is also having pain that radiates from his back around his bilateral low abdomen.  His last bowel movement was yesterday but his frequency is usually once or twice a week.  He has large, hard BM's that leave him with abdominal pain afterwards that may last 24 hours.  He does not use any stool softener or laxatives.  He is not taking any OTC products for his back. Lastly, he is having more erectile dysfunction after starting hi Proscar.  He reports that he is has a libido but is unable to have an erection or his erection is not as hard as it was prior to Proscar.    Review of Systems  Constitutional: Negative for chills, fatigue, fever and unexpected weight change.  Respiratory: Negative for chest tightness and shortness of breath.   Cardiovascular: Negative for chest pain.  Gastrointestinal: Positive for abdominal distention, abdominal pain and constipation. Negative for blood in stool, diarrhea, nausea, rectal pain and vomiting.  Genitourinary: Negative for difficulty urinating, dysuria, flank pain, frequency and testicular pain.  Musculoskeletal: Positive for back pain.      Objective:   Physical Exam  Constitutional: He is oriented to person, place, and time. He appears well-developed and well-nourished. No distress.  Cardiovascular: Normal rate, regular rhythm, normal heart sounds and intact distal pulses.  Pulmonary/Chest: Effort normal and breath sounds normal. No respiratory distress. He has no wheezes. He has no rales.  Abdominal: Normal aorta. He exhibits distension. He exhibits no mass. Bowel sounds are decreased. There is no hepatosplenomegaly. There is  tenderness in the right lower quadrant and left lower quadrant. There is no rebound, no guarding and no CVA tenderness. Hernia confirmed negative in the left inguinal area.  Large firm area palpated in the LLQ.    Neurological: He is alert and oriented to person, place, and time. He has normal strength.  Reflex Scores:      Patellar reflexes are 2+ on the right side and 2+ on the left side. Lower extremity strength 5/5 bilateral.  Tenderness over the lumbar spine.  No tenderness in the paraspinous processes. No tenderness in the left buttocks or lower extremity to palpation.   Skin: He is not diaphoretic.  Nursing note and vitals reviewed.     Assessment & Plan:  1. Other acute back pain Urinalysis in office is negative.  Prednisone dose pack.  Flexeril 10 mg PO at HS.  - POCT Urinalysis Dipstick (Automated)  2. Pelvic pain Magnesium Citrate 1 bottle tonight for bowel movement. High fiber diet.  Metamucil daily.  Increase water intake.  - POCT Urinalysis Dipstick (Automated)  Gentle stretches, heat pad alternating with cold to low back.   Jaryd Drew C Marija Calamari BSN RN NP student

## 2018-01-17 NOTE — Progress Notes (Addendum)
Subjective:    Patient ID: Edward Freeman, male    DOB: 11/02/1951, 67 y.o.   MRN: [id number]  HPI  67 year old male who  has a past medical history of Dyspnea on exertion, GERD (gastroesophageal reflux disease), Hypertension (01/14/10), Migraines, Nodule of left lung, Normal coronary arteries, Other malaise and fatigue, and Syncope and collapse.  He presents to the office today for an acute issue of lower lumbar spine pain with right sided sciatica. This has been apparent for 2 weeks. Reports having the same pain about 5 years ago. Denies any trauma aggrivating events.   Additionally, he complains of low abdominal pain for an unknown amount of time.  His last bowel movement was yesterday, reports that he usually only has 1-2 bowel movements per week and when he does have a bowel movement they appear to be large.  Does report abdominal pain during and after bowel movement. Is passing gas.   He also reports worsening erectile dysfunction since starting Proscar 5 mg daily.  He does report improvement in BPH with nocturia starting this medication.  Feels as though he has trouble getting an erection as well as maintaining.  Been trying either Viagra or Cialis.  BP Readings from Last 3 Encounters:  12/14/17 140/75  10/21/17 (!) 142/80  06/14/17 129/63   Review of Systems See HPI   Past Medical History:  Diagnosis Date  . Dyspnea on exertion   . GERD (gastroesophageal reflux disease)   . Hypertension 01/14/10   myocardial imaging- no evidence of ischemia or infarction. EF 53%  . Migraines   . Nodule of left lung   . Normal coronary arteries    by cardiac catheterization which I performed 11/04/03  . Other malaise and fatigue   . Syncope and collapse     Social History   Socioeconomic History  . Marital status: Single    Spouse name: Not on file  . Number of children: Not on file  . Years of education: Not on file  . Highest education level: Not on file  Social Needs  .  Financial resource strain: Not on file  . Food insecurity - worry: Not on file  . Food insecurity - inability: Not on file  . Transportation needs - medical: Not on file  . Transportation needs - non-medical: Not on file  Occupational History  . Not on file  Tobacco Use  . Smoking status: Never Smoker  . Smokeless tobacco: Never Used  Substance and Sexual Activity  . Alcohol use: No    Comment: 1-2 beers a month  . Drug use: No  . Sexual activity: Not on file  Other Topics Concern  . Not on file  Social History Narrative  . Not on file    History reviewed. No pertinent surgical history.  Family History  Problem Relation Age of Onset  . Hypertension Mother   . Stroke Mother   . Alcohol abuse Father   . Diabetes Sister   . Diabetes Brother   . Diabetes Sister   . Colon cancer Neg Hx   . Esophageal cancer Neg Hx   . Rectal cancer Neg Hx   . Stomach cancer Neg Hx     No Known Allergies  Current Outpatient Medications on File Prior to Visit  Medication Sig Dispense Refill  . amLODipine (NORVASC) 10 MG tablet Take 1 tablet (10 mg total) by mouth daily. 90 tablet 3  . aspirin 81 MG tablet Take 81 mg by  mouth every other day.     . finasteride (PROSCAR) 5 MG tablet Take 1 tablet (5 mg total) by mouth daily. 90 tablet 3   Current Facility-Administered Medications on File Prior to Visit  Medication Dose Route Frequency Provider Last Rate Last Dose  . 0.9 %  sodium chloride infusion  500 mL Intravenous Continuous Danis, Estill Cotta III, MD        Temp 98.2 F (36.8 C) (Oral)   Wt 196 lb (88.9 kg)   BMI 28.94 kg/m       Objective:   Physical Exam  Constitutional: He is oriented to person, place, and time. He appears well-developed and well-nourished. No distress.  Cardiovascular: Normal rate, regular rhythm, normal heart sounds and intact distal pulses. Exam reveals no gallop and no friction rub.  No murmur heard. Pulmonary/Chest: Effort normal and breath sounds normal.  No respiratory distress. He has no wheezes. He has no rales. He exhibits no tenderness.  Abdominal: Soft. Bowel sounds are normal. He exhibits distension. He exhibits no mass. There is no tenderness. There is no rebound and no guarding.  Large stool burden especially noticeable in left lower quadrant  Musculoskeletal: Normal range of motion. He exhibits tenderness. He exhibits no edema or deformity.  He did have slight tenderness to lower lumbar spine.  No tenderness with palpation down sciatic nerve but appeared in discomfort when changing positions from sitting to standing as well as walking.  Lower extremity strength bilaterally 5/5   Neurological: He is alert and oriented to person, place, and time.  Skin: Skin is warm and dry. No rash noted. He is not diaphoretic. No erythema. No pallor.  Psychiatric: He has a normal mood and affect. His behavior is normal. Judgment and thought content normal.  Nursing note and vitals reviewed.     Assessment & Plan:  1. Other acute back pain - Consistent with sciatica  - POCT Urinalysis Dipstick (Automated)- negative - methylPREDNISolone (MEDROL DOSEPAK) 4 MG TBPK tablet; Take as directed  Dispense: 21 tablet; Refill: 0 - cyclobenzaprine (FLEXERIL) 10 MG tablet; Take 1 tablet (10 mg total) by mouth at bedtime.  Dispense: 15 tablet; Refill: 0 - Follow up as needed 2. Constipation, unspecified constipation type -Advised magnesium citrate this evening.  Once stool burden has been evacuated then he is to increase dietary fiber through increasing fruits and vegetables. Increase water intake   He can take Metamucil as directed daily.  Follow-up if no improvement and at that time will consider referral to GI  3. Erectile dysfunction, unspecified erectile dysfunction type -We reviewed side effects of both Cialis and Viagra.  Ultimately he decided to try Cialis despite being more costly. - tadalafil (CIALIS) 20 MG tablet; Take 1 tablet (20 mg total) by mouth  daily as needed for erectile dysfunction.  Dispense: 10 tablet; Refill: 2

## 2018-01-17 NOTE — Patient Instructions (Signed)
For the sciatica pain - I have prescribed a medrol dose pack and flexeril - take as directed   For constipation - high fiber diet, can add Metamucil daily. Tonight drink a bottle a magnesium citrate ( mix with sprite or 7 up)

## 2018-01-23 ENCOUNTER — Ambulatory Visit: Payer: Self-pay

## 2018-01-23 NOTE — Telephone Encounter (Signed)
Patient called with c/o "leg pain and numbness." He says "I saw Cory on 3/12 and was prescribed medicine for my lower back pain. On Friday, the pain was gone for the most part, just a little every now and then. Saturday I went out to blow leaves on the side of my house. Yesterday, I noticed pain to my left leg from the thigh to calf and the calf is having some numbness. I wanted to know if the medication can be refilled." I asked if the numbness was there when he went on 3/12, he said "no, this is new." I advised because this is a new problem, he would need to be triaged for that problem and follow up with the provider, if warranted. He verbalized understanding. I asked how severe is the pain and if there is swelling, he says "the pain is a 6-7 and I haven't noticed swelling." I asked about other symptoms, he says "I do have some weakness to the leg and I have been walking with a limp since yesterday. I think I overdone it outside on Saturday." According to protocol, see PCP within 24 hours, appointment made for tomorrow at 1000 with Dorothyann Peng, care advice given, patient verbalized understanding.   Reason for Disposition . Numbness in a leg or foot (i.e., loss of sensation)  Answer Assessment - Initial Assessment Questions 1. ONSET: "When did the pain start?"      Yesterday 2. LOCATION: "Where is the pain located?"      Left leg/calf and thigh; numbness to calf 3. PAIN: "How bad is the pain?"    (Scale 1-10; or mild, moderate, severe)   -  MILD (1-3): doesn't interfere with normal activities    -  MODERATE (4-7): interferes with normal activities (e.g., work or school) or awakens from sleep, limping    -  SEVERE (8-10): excruciating pain, unable to do any normal activities, unable to walk     6-7 4. WORK OR EXERCISE: "Has there been any recent work or exercise that involved this part of the body?"      Saturday blow leaves 5. CAUSE: "What do you think is causing the leg pain?"     Maybe  working outside on Saturday 6. OTHER SYMPTOMS: "Do you have any other symptoms?" (e.g., chest pain, back pain, breathing difficulty, swelling, rash, fever, numbness, weakness)     Numbness to left calf, little limp when walk, left leg weakness 7. PREGNANCY: "Is there any chance you are pregnant?" "When was your last menstrual period?"     N/A  Protocols used: LEG PAIN-A-AH

## 2018-01-24 ENCOUNTER — Ambulatory Visit (INDEPENDENT_AMBULATORY_CARE_PROVIDER_SITE_OTHER): Payer: Medicare HMO | Admitting: Adult Health

## 2018-01-24 ENCOUNTER — Encounter: Payer: Self-pay | Admitting: Adult Health

## 2018-01-24 VITALS — BP 120/70 | Temp 98.2°F | Wt 197.0 lb

## 2018-01-24 DIAGNOSIS — M5432 Sciatica, left side: Secondary | ICD-10-CM | POA: Diagnosis not present

## 2018-01-24 DIAGNOSIS — M549 Dorsalgia, unspecified: Secondary | ICD-10-CM | POA: Diagnosis not present

## 2018-01-24 MED ORDER — METHYLPREDNISOLONE 4 MG PO TBPK: ORAL_TABLET | ORAL | 0 refills | Status: DC

## 2018-01-24 NOTE — Progress Notes (Signed)
Subjective:    Patient ID: Edward Freeman, male    DOB: [birth year], 67 y.o.   MRN: [id number]  HPI  67 year old male who  has a past medical history of Dyspnea on exertion, GERD (gastroesophageal reflux disease), Hypertension (01/14/10), Migraines, Nodule of left lung, Normal coronary arteries, Other malaise and fatigue, and Syncope and collapse.   He returns today for follow up today for low back pain and sciatica. He was seen last week and prescribed a medrol dose pack and flexeril. He   reports that his low back pain has started to resolve. He went out and worked in his yard this weekend, after the second day of working in the yard he started to experience left leg pain and numbness. He reports pain to the left thigh and numbness on outside of leg below the knee to the ankle. Per patient " it feels like a sponge".   He denies any issues with bowel or bladder. Discomfort is worse with sitting.   Review of Systems See HPI   Past Medical History:  Diagnosis Date  . Dyspnea on exertion   . GERD (gastroesophageal reflux disease)   . Hypertension 01/14/10   myocardial imaging- no evidence of ischemia or infarction. EF 53%  . Migraines   . Nodule of left lung   . Normal coronary arteries    by cardiac catheterization which I performed 11/04/03  . Other malaise and fatigue   . Syncope and collapse     Social History   Socioeconomic History  . Marital status: Single    Spouse name: Not on file  . Number of children: Not on file  . Years of education: Not on file  . Highest education level: Not on file  Social Needs  . Financial resource strain: Not on file  . Food insecurity - worry: Not on file  . Food insecurity - inability: Not on file  . Transportation needs - medical: Not on file  . Transportation needs - non-medical: Not on file  Occupational History  . Not on file  Tobacco Use  . Smoking status: Never Smoker  . Smokeless tobacco: Never Used  Substance and Sexual  Activity  . Alcohol use: No    Comment: 1-2 beers a month  . Drug use: No  . Sexual activity: Not on file  Other Topics Concern  . Not on file  Social History Narrative  . Not on file    History reviewed. No pertinent surgical history.  Family History  Problem Relation Age of Onset  . Hypertension Mother   . Stroke Mother   . Alcohol abuse Father   . Diabetes Sister   . Diabetes Brother   . Diabetes Sister   . Colon cancer Neg Hx   . Esophageal cancer Neg Hx   . Rectal cancer Neg Hx   . Stomach cancer Neg Hx     No Known Allergies  Current Outpatient Medications on File Prior to Visit  Medication Sig Dispense Refill  . amLODipine (NORVASC) 10 MG tablet Take 1 tablet (10 mg total) by mouth daily. 90 tablet 3  . aspirin 81 MG tablet Take 81 mg by mouth every other day.     . cyclobenzaprine (FLEXERIL) 10 MG tablet Take 1 tablet (10 mg total) by mouth at bedtime. 15 tablet 0  . finasteride (PROSCAR) 5 MG tablet Take 1 tablet (5 mg total) by mouth daily. 90 tablet 3  . tadalafil (CIALIS) 20 MG tablet  Take 1 tablet (20 mg total) by mouth daily as needed for erectile dysfunction. 10 tablet 2   Current Facility-Administered Medications on File Prior to Visit  Medication Dose Route Frequency Provider Last Rate Last Dose  . 0.9 %  sodium chloride infusion  500 mL Intravenous Continuous Danis, Estill Cotta III, MD        BP 120/70 (BP Location: Left Arm, Cuff Size: Large)   Temp 98.2 F (36.8 C) (Oral)   Wt 197 lb (89.4 kg)   BMI 29.09 kg/m       Objective:   Physical Exam  Constitutional: He is oriented to person, place, and time. He appears well-developed and well-nourished. No distress.  Cardiovascular: Normal rate, regular rhythm, normal heart sounds and intact distal pulses. Exam reveals no gallop and no friction rub.  No murmur heard. Pulmonary/Chest: Effort normal and breath sounds normal. No respiratory distress. He has no wheezes. He has no rales. He exhibits no  tenderness.  Musculoskeletal: He exhibits tenderness (to left thigh ).  Neurological: He is alert and oriented to person, place, and time.  Exam reveals decreased sensation with momofiliment testing on lateral aspect of left leg below knee. Normal sensation on medial aspect. Normal pulses to bilateral lower extremities. + vibration senses throughout.   Skin: Skin is warm and dry. No rash noted. He is not diaphoretic. No erythema. No pallor.  Psychiatric: He has a normal mood and affect. His behavior is normal. Judgment and thought content normal.  Nursing note and vitals reviewed.     Assessment & Plan:   1. Sciatica of left side - Will trial another round of prednisone. Advised rest. Can take motrin and use a heating pad - methylPREDNISolone (MEDROL DOSEPAK) 4 MG TBPK tablet; Take as directed  Dispense: 21 tablet; Refill: 0 - Advised to follow up with me via phone conversation on Friday. If still having numbness than will do MRI of lumbar spine.

## 2018-01-28 ENCOUNTER — Encounter (HOSPITAL_COMMUNITY): Payer: Self-pay | Admitting: *Deleted

## 2018-01-28 ENCOUNTER — Other Ambulatory Visit: Payer: Self-pay

## 2018-01-28 ENCOUNTER — Ambulatory Visit (HOSPITAL_COMMUNITY)
Admission: EM | Admit: 2018-01-28 | Discharge: 2018-01-28 | Disposition: A | Discharge status: Home / Self Care | Payer: Medicare HMO | Attending: Family Medicine | Admitting: Family Medicine

## 2018-01-28 DIAGNOSIS — M5432 Sciatica, left side: Secondary | ICD-10-CM | POA: Diagnosis not present

## 2018-01-28 MED ORDER — PREDNISONE 20 MG PO TABS: ORAL_TABLET | ORAL | 0 refills | Status: DC

## 2018-01-28 MED ORDER — OXYCODONE-ACETAMINOPHEN 5-325 MG PO TABS: 1.0000 | ORAL_TABLET | ORAL | 0 refills | Status: DC | PRN

## 2018-01-28 NOTE — Discharge Instructions (Signed)
Call your health care provider on Monday to discuss the next step

## 2018-01-28 NOTE — ED Provider Notes (Signed)
[place]   [id number] 01/28/18 Arrival Time: 1242   SUBJECTIVE:  Edward Freeman is a 67 y.o. male who presents to the urgent care with complaint of Extreme left leg pain, per pt pain radiates from left hip all down to his lower leg, per pt he was dx with sciatica pain , per pt on Thursday at work he fell and landed on the left knee he is having pain   Patient is having some numbness below the knee as well.  Pain is worsened by weight bearing and especially with sitting.  No loss of urinary continence and no fever.  Past Medical History:  Diagnosis Date  . Dyspnea on exertion   . GERD (gastroesophageal reflux disease)   . Hypertension 01/14/10   myocardial imaging- no evidence of ischemia or infarction. EF 53%  . Migraines   . Nodule of left lung   . Normal coronary arteries    by cardiac catheterization which I performed 11/04/03  . Other malaise and fatigue   . Syncope and collapse    Family History  Problem Relation Age of Onset  . Hypertension Mother   . Stroke Mother   . Alcohol abuse Father   . Diabetes Sister   . Diabetes Brother   . Diabetes Sister   . Colon cancer Neg Hx   . Esophageal cancer Neg Hx   . Rectal cancer Neg Hx   . Stomach cancer Neg Hx    Social History   Socioeconomic History  . Marital status: Single    Spouse name: Not on file  . Number of children: Not on file  . Years of education: Not on file  . Highest education level: Not on file  Occupational History  . Not on file  Social Needs  . Financial resource strain: Not on file  . Food insecurity:    Worry: Not on file    Inability: Not on file  . Transportation needs:    Medical: Not on file    Non-medical: Not on file  Tobacco Use  . Smoking status: Never Smoker  . Smokeless tobacco: Never Used  Substance and Sexual Activity  . Alcohol use: No    Comment: 1-2 beers a month  . Drug use: No  . Sexual activity: Not on file  Lifestyle  . Physical activity:    Days  per week: Not on file    Minutes per session: Not on file  . Stress: Not on file  Relationships  . Social connections:    Talks on phone: Not on file    Gets together: Not on file    Attends religious service: Not on file    Active member of club or organization: Not on file    Attends meetings of clubs or organizations: Not on file    Relationship status: Not on file  . Intimate partner violence:    Fear of current or ex partner: Not on file    Emotionally abused: Not on file    Physically abused: Not on file    Forced sexual activity: Not on file  Other Topics Concern  . Not on file  Social History Narrative  . Not on file   Current Facility-Administered Medications for the 01/28/18 encounter [facility] Encounter)  Medication  . 0.9 %  sodium chloride infusion   Current Meds  Medication Sig  . amLODipine (NORVASC) 10 MG tablet Take 1 tablet (10 mg total) by mouth daily.  Marland Kitchen aspirin 81 MG  tablet Take 81 mg by mouth every other day.   . cyclobenzaprine (FLEXERIL) 10 MG tablet Take 1 tablet (10 mg total) by mouth at bedtime.  . finasteride (PROSCAR) 5 MG tablet Take 1 tablet (5 mg total) by mouth daily.  . [DISCONTINUED] methylPREDNISolone (MEDROL DOSEPAK) 4 MG TBPK tablet Take as directed   No Known Allergies    ROS: As per HPI, remainder of ROS negative.   OBJECTIVE:   Vitals:   01/28/18 1315  BP: 134/84  Pulse: 85  Temp: (!) 97.4 F (36.3 C)  TempSrc: Oral  SpO2: 95%     General appearance: alert; no distress Eyes: PERRL; EOMI; conjunctiva normal HENT: normocephalic; atraumatic; oral mucosa normal Neck: supple Abdomen: soft, non-tender; bowel sounds normal; no masses or organomegaly; no guarding or rebound tenderness Back: no CVA tenderness Extremities: no cyanosis or edema; symmetrical with no gross deformities; Able to flex hip normally and internally and externally rotate the leg. Skin: warm and dry Neurologic: normal gait; grossly normal Psychological:  alert and cooperative; normal mood and affect    Labs:  Results for orders placed or performed in visit on 01/17/18  POCT Urinalysis Dipstick (Automated)  Result Value Ref Range   Color, UA yellow    Clarity, UA clear    Glucose, UA neg    Bilirubin, UA neg    Ketones, UA neg    Spec Grav, UA 1.025 1.010 - 1.025   Blood, UA neg    pH, UA 6.0 5.0 - 8.0   Protein, UA neg    Urobilinogen, UA 0.2 0.2 or 1.0 E.U./dL   Nitrite, UA neg    Leukocytes, UA Negative Negative    Labs Reviewed - No data to display  No results found.     ASSESSMENT & PLAN:  1. Left sided sciatica     Meds ordered this encounter  Medications  . oxyCODONE-acetaminophen (PERCOCET/ROXICET) 5-325 MG tablet    Sig: Take 1 tablet by mouth every 4 (four) hours as needed for severe pain.    Dispense:  15 tablet    Refill:  0  . predniSONE (DELTASONE) 20 MG tablet    Sig: Three with food today and tomorrow, then two daily    Dispense:  10 tablet    Refill:  0    Reviewed expectations re: course of current medical issues. Questions answered. Outlined signs and symptoms indicating need for more acute intervention. Patient verbalized understanding. After Visit Summary given.     Robyn Haber, MD 01/28/18 1336

## 2018-01-28 NOTE — ED Triage Notes (Addendum)
Extreme left leg pain, per pt pain radiates from left hip all down to his lower leg, per pt he was dx with sciatica pain , per pt on Friday at work he fell and landed on the left knee he is having pain

## 2018-02-03 ENCOUNTER — Ambulatory Visit (INDEPENDENT_AMBULATORY_CARE_PROVIDER_SITE_OTHER): Payer: Medicare HMO

## 2018-02-03 ENCOUNTER — Encounter: Payer: Self-pay | Admitting: Adult Health

## 2018-02-03 ENCOUNTER — Ambulatory Visit (INDEPENDENT_AMBULATORY_CARE_PROVIDER_SITE_OTHER): Payer: Medicare HMO | Admitting: Adult Health

## 2018-02-03 VITALS — BP 100/60 | Temp 98.3°F | Wt 191.0 lb

## 2018-02-03 DIAGNOSIS — M4807 Spinal stenosis, lumbosacral region: Secondary | ICD-10-CM | POA: Diagnosis not present

## 2018-02-03 DIAGNOSIS — M25562 Pain in left knee: Secondary | ICD-10-CM

## 2018-02-03 DIAGNOSIS — M5432 Sciatica, left side: Secondary | ICD-10-CM

## 2018-02-03 DIAGNOSIS — M25552 Pain in left hip: Secondary | ICD-10-CM

## 2018-02-03 DIAGNOSIS — M5033 Other cervical disc degeneration, cervicothoracic region: Secondary | ICD-10-CM | POA: Diagnosis not present

## 2018-02-03 MED ORDER — DIAZEPAM 5 MG PO TABS: 5.0000 mg | ORAL_TABLET | Freq: Three times a day (TID) | ORAL | 0 refills | Status: DC | PRN

## 2018-02-03 NOTE — Progress Notes (Signed)
Subjective:    Patient ID: Edward Freeman, male    DOB: Oct 01, 1951, 67 y.o.   MRN: [id number]  HPI  67 year old male who  has a past medical history of Dyspnea on exertion, GERD (gastroesophageal reflux disease), Hypertension (01/14/10), Migraines, Nodule of left lung, Normal coronary arteries, Other malaise and fatigue, and Syncope and collapse.  He presents to the office today for follow up regarding sciatica pain. He had originally seen me on 01/17/2018 and was prescribed a medrol dose pack and flexeril. This had originally helped but then he worked out in the yard and his pain returned. He was seen then on 01/24/2018 and another trial of prednsone was given   He was then seen at [facility] on 01/28/2018 for the same complaint. He reports that he fell at work and landed on his left knee. He was given a prescription for percocet and prednisone at [facility]. He reports that his pain has not resolved and he continues to have left sided sciatica with left knee pain.   Review of Systems See HPI   Past Medical History:  Diagnosis Date  . Dyspnea on exertion   . GERD (gastroesophageal reflux disease)   . Hypertension 01/14/10   myocardial imaging- no evidence of ischemia or infarction. EF 53%  . Migraines   . Nodule of left lung   . Normal coronary arteries    by cardiac catheterization which I performed 11/04/03  . Other malaise and fatigue   . Syncope and collapse     Social History   Socioeconomic History  . Marital status: Single    Spouse name: Not on file  . Number of children: Not on file  . Years of education: Not on file  . Highest education level: Not on file  Occupational History  . Not on file  Social Needs  . Financial resource strain: Not on file  . Food insecurity:    Worry: Not on file    Inability: Not on file  . Transportation needs:    Medical: Not on file    Non-medical: Not on file  Tobacco Use  . Smoking status: Never Smoker  . Smokeless tobacco: Never Used    Substance and Sexual Activity  . Alcohol use: No    Comment: 1-2 beers a month  . Drug use: No  . Sexual activity: Not on file  Lifestyle  . Physical activity:    Days per week: Not on file    Minutes per session: Not on file  . Stress: Not on file  Relationships  . Social connections:    Talks on phone: Not on file    Gets together: Not on file    Attends religious service: Not on file    Active member of club or organization: Not on file    Attends meetings of clubs or organizations: Not on file    Relationship status: Not on file  . Intimate partner violence:    Fear of current or ex partner: Not on file    Emotionally abused: Not on file    Physically abused: Not on file    Forced sexual activity: Not on file  Other Topics Concern  . Not on file  Social History Narrative  . Not on file    History reviewed. No pertinent surgical history.  Family History  Problem Relation Age of Onset  . Hypertension Mother   . Stroke Mother   . Alcohol abuse Father   .  Diabetes Sister   . Diabetes Brother   . Diabetes Sister   . Colon cancer Neg Hx   . Esophageal cancer Neg Hx   . Rectal cancer Neg Hx   . Stomach cancer Neg Hx     No Known Allergies  Current Outpatient Medications on File Prior to Visit  Medication Sig Dispense Refill  . amLODipine (NORVASC) 10 MG tablet Take 1 tablet (10 mg total) by mouth daily. 90 tablet 3  . aspirin 81 MG tablet Take 81 mg by mouth every other day.     . finasteride (PROSCAR) 5 MG tablet Take 1 tablet (5 mg total) by mouth daily. 90 tablet 3  . predniSONE (DELTASONE) 20 MG tablet Three with food today and tomorrow, then two daily 10 tablet 0  . tadalafil (CIALIS) 20 MG tablet Take 1 tablet (20 mg total) by mouth daily as needed for erectile dysfunction. 10 tablet 2  . oxyCODONE-acetaminophen (PERCOCET/ROXICET) 5-325 MG tablet Take 1 tablet by mouth every 4 (four) hours as needed for severe pain. (Patient not taking: Reported on  02/03/2018) 15 tablet 0   Current Facility-Administered Medications on File Prior to Visit  Medication Dose Route Frequency Provider Last Rate Last Dose  . 0.9 %  sodium chloride infusion  500 mL Intravenous Continuous Danis, Estill Cotta III, MD        BP 100/60 (BP Location: Left Arm)   Temp 98.3 F (36.8 C) (Oral)   Wt 191 lb (86.6 kg)   BMI 28.21 kg/m       Objective:   Physical Exam  Constitutional: He is oriented to person, place, and time. He appears well-developed and well-nourished. No distress.  Cardiovascular: Normal rate, regular rhythm, normal heart sounds and intact distal pulses. Exam reveals no gallop and no friction rub.  No murmur heard. Pulmonary/Chest: Effort normal and breath sounds normal. No respiratory distress. He has no wheezes. He has no rales. He exhibits no tenderness.  Musculoskeletal:       Left hip: He exhibits tenderness and bony tenderness. He exhibits normal range of motion, no swelling, no crepitus and no deformity.       Left knee: He exhibits bony tenderness. He exhibits normal range of motion, no swelling, no deformity, no erythema, no LCL laxity, normal meniscus and no MCL laxity. Tenderness found. No medial joint line, no lateral joint line, no MCL, no LCL and no patellar tendon tenderness noted.  Neurological: He is alert and oriented to person, place, and time.  Skin: Skin is warm and dry. No rash noted. He is not diaphoretic. No erythema. No pallor.  Psychiatric: He has a normal mood and affect. His behavior is normal. Judgment and thought content normal.  Nursing note and vitals reviewed.     Assessment & Plan:  1. Left sided sciatica - has failed treatment multiple times. Will send to sports medicine  - Ambulatory referral to Sports Medicine - diazepam (VALIUM) 5 MG tablet; Take 1 tablet (5 mg total) by mouth every 8 (eight) hours as needed for muscle spasms.  Dispense: 30 tablet; Refill: 0 - DG Lumbar Spine Complete; Future - DG HIP UNILAT  WITH PELVIS 2-3 VIEWS LEFT; Future - DG Knee 1-2 Views Left; Future  2. Left hip pain  - DG Lumbar Spine Complete; Future - DG HIP UNILAT WITH PELVIS 2-3 VIEWS LEFT; Future  3. Acute pain of left knee  - DG Knee 1-2 Views Left; Future  Dorothyann Peng, NP

## 2018-02-09 ENCOUNTER — Encounter: Payer: Self-pay | Admitting: Sports Medicine

## 2018-02-09 ENCOUNTER — Ambulatory Visit (INDEPENDENT_AMBULATORY_CARE_PROVIDER_SITE_OTHER): Payer: Medicare HMO | Admitting: Sports Medicine

## 2018-02-09 VITALS — BP 118/82 | HR 76 | Ht 69.0 in | Wt 192.6 lb

## 2018-02-09 DIAGNOSIS — M25562 Pain in left knee: Secondary | ICD-10-CM

## 2018-02-09 DIAGNOSIS — M5432 Sciatica, left side: Secondary | ICD-10-CM

## 2018-02-09 DIAGNOSIS — M5442 Lumbago with sciatica, left side: Secondary | ICD-10-CM | POA: Diagnosis not present

## 2018-02-09 DIAGNOSIS — M25552 Pain in left hip: Secondary | ICD-10-CM

## 2018-02-09 MED ORDER — GABAPENTIN 300 MG PO CAPS
ORAL_CAPSULE | ORAL | 1 refills | Status: DC
Start: 2018-02-09 — End: 2018-04-19

## 2018-02-09 NOTE — Progress Notes (Signed)
PROCEDURE NOTE: THERAPEUTIC EXERCISES (97110) 15 minutes spent for Therapeutic exercises as below and as referenced in the AVS. This included exercises focusing on stretching, strengthening, with significant focus on eccentric aspects.  Proper technique shown and discussed handout in great detail with ATC. All questions were discussed and answered.   Long term goals include an improvement in range of motion, strength, endurance as well as avoiding reinjury. Frequency of visits is one time as determined during today's  office visit. Frequency of exercises to be performed is as per handout.  EXERCISES REVIEWED:  Intrinsic back and core conditioning.

## 2018-02-09 NOTE — Patient Instructions (Signed)
Please perform the exercise program that we have prepared for you and gone over in detail on a daily basis.  In addition to the handout you were provided you can access your program through: [email or web address]   Your unique program code is: [email or web address]

## 2018-02-09 NOTE — Progress Notes (Signed)
Edward Freeman. Edward Freeman, Edward Freeman at [place]  Edward Freeman - 67 y.o. male MRN [id number]  Date of birth: Sep 20, 1951  Visit Date: 02/09/2018  PCP: Dorothyann Peng, NP   Referred by: Dorothyann Peng, NP  Scribe for today's visit: Josepha Pigg, CMA     SUBJECTIVE:  Edward Freeman is here for New Patient (Initial Visit) (L knee pain) .  Referred by: Dorothyann Peng, NP His L knee pain symptoms INITIALLY: Began about 6 weeks ago and MOI is unknown. He has hx of sciatic nerve pain.  Described as severe aching and stabbing like "little needles" during flare-up, radiating to the L lower leg.  Worsened with sitting for prolonged periods of time or driving.  Improved with activity. Also improved when lying down with leg elevated.  Additional associated symptoms include: Pt also c/o L hip and lower back pain with L-sided sciatica.  He has noticed increased numbness around the knee and lower leg that comes and goes. He has also noticed groin pain. He denies clicking and popping but he has noticed swelling around the knee.     At this time symptoms are improving compared to onset  He has completed Medrol Dosepak with some relief. He has tried taking Flexeril, Percocet and Diazepam with some relief.   ROS Denies night time disturbances. Denies fevers, chills, or night sweats. Denies unexplained weight loss. Denies personal history of cancer. Denies changes in bowel or bladder habits. Reports recent unreported falls, x 3. Denies new or worsening dyspnea or wheezing. Denies headaches or dizziness.  Reports numbness, tingling or weakness  In the extremities.  Denies dizziness or presyncopal episodes Reports lower extremity edema    HISTORY & PERTINENT PRIOR DATA:  Prior History reviewed and updated per electronic medical record.  Significant/pertinent history, findings, studies include:  reports that he has never smoked. He has never used  smokeless tobacco. No results for input(s): HGBA1C, LABURIC, CREATINE in the last 8760 hours. No specialty comments available. No problems updated.  OBJECTIVE:  VS:  HT:5\' 9"  (175.3 cm)   WT:192 lb 9.6 oz (87.4 kg)  BMI:28.43    BP:118/82  HR:76bpm  TEMP: ( )  RESP:96 %   PHYSICAL EXAM: Constitutional: WDWN, Non-toxic appearing. Psychiatric: Alert & appropriately interactive.  Not depressed or anxious appearing. Respiratory: No increased work of breathing.  Trachea Midline Eyes: Pupils are equal.  EOM intact without nystagmus.  No scleral icterus  Vascular Exam: warm to touch no edema  lower extremity neuro exam: diminished strength in left leg with heel toe walking. diminished DTR in the diminished Achilles reflex on the left reduced sensation in the lateral leg.  MSK Exam: Positive straight leg raise on the left, hip abduction strength is 4/5.  No significant pain with internal/external rotation of the hip.   ASSESSMENT & PLAN:   1. Left hip pain   2. Sciatica of left side   3. Acute pain of left knee   4. Bilateral low back pain with left-sided sciatica, unspecified chronicity     PLAN: Given the persistent symptoms lack of improvement and diminished reflexes further diagnostic valuation with MRI of lumbar spine indicated at this time.  Continue with home therapeutic exercises as reviewed per procedure note.   Discussed the foundation of treatment for this condition is physical therapy and/or daily (5-6 days/week) therapeutic exercises, focusing on core strengthening, coordination, neuromuscular control/reeducation.  Therapeutic exercises prescribed per procedure note. Follow-up: Return for  MRI results review.   Gabapentin titration.     Please see additional documentation for Objective, Assessment and Plan sections. Pertinent additional documentation may be included in corresponding procedure notes, imaging studies, problem based documentation and patient  instructions. Please see these sections of the encounter for additional information regarding this visit.  CMA/ATC served as Education administrator during this visit. History, Physical, and Plan performed by medical provider. Documentation and orders reviewed and attested to.      Gerda Diss, [place] [facility] Physician

## 2018-02-14 ENCOUNTER — Telehealth: Payer: Self-pay | Admitting: Family Medicine

## 2018-02-14 NOTE — Telephone Encounter (Signed)
Copied from [place] [id number]. Topic: Referral - Request >> Feb 14, 2018  1:56 PM Hewitt Shorts wrote: CRM for notification. See Telephone encounter for: 02/14/18.pt is wanting to let the referral department to put a hold on his mri that the provider was going to schedule    Please forward to provider also.

## 2018-02-15 NOTE — Telephone Encounter (Signed)
Spoke with patient and he says that his severe pain has lightening since he has started using the medication that was prescribed. He does still have some numbness. He would like to hold off on MRI for now since he seems to be improving. He will schedule f/u if pain persists or worsens, otherwise he plans to f/u PRN. Will forward to Dr. Paulla Fore as Juluis Rainier.

## 2018-02-15 NOTE — Telephone Encounter (Signed)
Looks like Dr. Paulla Fore ordered this test.

## 2018-03-16 ENCOUNTER — Encounter: Payer: Self-pay | Admitting: Sports Medicine

## 2018-03-27 ENCOUNTER — Telehealth: Payer: Self-pay | Admitting: Adult Health

## 2018-03-27 NOTE — Telephone Encounter (Signed)
Called pt and left VM to call the office.  

## 2018-03-27 NOTE — Telephone Encounter (Signed)
See note

## 2018-03-27 NOTE — Telephone Encounter (Signed)
Copied from [place] [id number]. Topic: Quick Communication - See Telephone Encounter >> Mar 27, 2018  9:32 AM Rutherford Nail, NT wrote: CRM for notification. See Telephone encounter for: 03/27/18. Patient is calling and states that the pain in his left knee has gone away, but there is numbness in his knee and leg. States that he had been seen for the issue by Dr Paulla Fore and an MRI was scheduled a while back, but cancelled it because the pain went away. States that there is no pain, but it does not feel normal. States that he has normal range of motion and can bend it without any issues. Would like a call back to discuss what his next options should be? CB#: [phone]  (C)

## 2018-03-27 NOTE — Telephone Encounter (Signed)
Pt scheduled to see Dr. Paulla Fore 03/28/2018.

## 2018-03-27 NOTE — Telephone Encounter (Signed)
Pt needs to schedule OV for re-evaluation OR we can place new order for MRI L-spine and pt and follow-up with Dr. Paulla Fore after the MRI.

## 2018-03-28 ENCOUNTER — Ambulatory Visit (INDEPENDENT_AMBULATORY_CARE_PROVIDER_SITE_OTHER): Payer: Medicare HMO | Admitting: Sports Medicine

## 2018-03-28 ENCOUNTER — Encounter: Payer: Self-pay | Admitting: Sports Medicine

## 2018-03-28 VITALS — BP 110/74 | HR 60 | Ht 69.0 in | Wt 194.0 lb

## 2018-03-28 DIAGNOSIS — M479 Spondylosis, unspecified: Secondary | ICD-10-CM

## 2018-03-28 DIAGNOSIS — M25562 Pain in left knee: Secondary | ICD-10-CM

## 2018-03-28 DIAGNOSIS — M5432 Sciatica, left side: Secondary | ICD-10-CM | POA: Diagnosis not present

## 2018-03-28 DIAGNOSIS — M25552 Pain in left hip: Secondary | ICD-10-CM | POA: Diagnosis not present

## 2018-03-28 NOTE — Progress Notes (Signed)
Edward Freeman. Edward Freeman, [place] at [facility]  Edward Freeman - 67 y.o. male MRN [id number]  Date of birth: September 22, 1951  Visit Date: 03/28/2018  PCP: Dorothyann Peng, NP   Referred by: Dorothyann Peng, NP  Scribe for today's visit: Josepha Pigg, CMA     SUBJECTIVE:  Edward Freeman is here for No chief complaint on file.  02/09/2018: His L knee pain symptoms INITIALLY: Began about 6 weeks ago and MOI is unknown. He has hx of sciatic nerve pain.  Described as severe aching and stabbing like "little needles" during flare-up, radiating to the L lower leg.  Worsened with sitting for prolonged periods of time or driving.  Improved with activity. Also improved when lying down with leg elevated.  Additional associated symptoms include: Pt also c/o L hip and lower back pain with L-sided sciatica.  He has noticed increased numbness around the knee and lower leg that comes and goes. He has also noticed groin pain. He denies clicking and popping but he has noticed swelling around the knee.    At this time symptoms are improving compared to onset  He has completed Medrol Dosepak with some relief. He has tried taking Flexeril, Percocet and Diazepam with some relief.   03/28/2018: Compared to the last office visit, his previously described symptoms show no change. He continues to have numbness from the knee to the mid calf and in the quads. He has noticed swelling around the knee. Occasionally he will have sharp pains "sticking" him in the leg and knee. He has hx of muscle spasms in both legs.  Current symptoms are moderate & are radiating to the thigh and calf.  He had been taking Flexeril, Percocet, and Diazepam but these have been d/c or completed course. Gabapentin was prescribed at his last visit but didn't notice much of a difference so he stopped taking it.   MRI was ordered but he has not had it done.  He reports that L hip pain and related  sciatica has improved.   ROS Denies night time disturbances. Denies fevers, chills, or night sweats. Denies unexplained weight loss. Denies personal history of cancer. Reports changes in bowel or bladder habits - was put on metamucil for constipation and has seen improvement.. Reports recent unreported falls x 3 - no injuries. Denies new or worsening dyspnea or wheezing. Denies headaches or dizziness.  Reports numbness, tingling or weakness  In the extremities.  Denies dizziness or presyncopal episodes Reports lower extremity edema    HISTORY & PERTINENT PRIOR DATA:  Prior History reviewed and updated per electronic medical record.  Significant/pertinent history, findings, studies include:  reports that he has never smoked. He has never used smokeless tobacco. No results for input(s): HGBA1C, LABURIC, CREATINE in the last 8760 hours. No specialty comments available. Problem  Spondylosis    OBJECTIVE:  VS:  HT:5\' 9"  (175.3 cm)   WT:194 lb (88 kg)  BMI:28.64    BP:110/74  HR:60bpm  TEMP: ( )  RESP:95 %   PHYSICAL EXAM: Constitutional: WDWN, Non-toxic appearing. Psychiatric: Alert & appropriately interactive.  Not depressed or anxious appearing. Respiratory: No increased work of breathing.  Trachea Midline Eyes: Pupils are equal.  EOM intact without nystagmus.  No scleral icterus  Vascular Exam: warm to touch no edema  lower extremity neuro exam: Lower extremity reflexes are slightly diminished however this is mild.  He has overall moderate strength and is able to heel and  toe walk with only mild difficulty.  He does have a small amount of weakness with knee extension.  MSK Exam: Negative straight leg raise.   ASSESSMENT & PLAN:   1. Left hip pain   2. Sciatica of left side   3. Acute pain of left knee   4. Spondylosis     PLAN: Given the ongoing mild degree of symptoms that he has slight weakness would like to evaluate with nerve conduction studies for better  characterization of his symptoms.  As long as these do not show a significant amount of nerve damage further therapeutic intervention can be deferred as this is what he would like to do but given the persistent symptoms and slight weakness confirming the results and findings it is prudent.  He is understanding we will plan to call her with the results.  Follow-up: Return for We will call you about your results from your Nerve conduction study.     Please see additional documentation for Objective, Assessment and Plan sections. Pertinent additional documentation may be included in corresponding procedure notes, imaging studies, problem based documentation and patient instructions. Please see these sections of the encounter for additional information regarding this visit.  CMA/ATC served as Education administrator during this visit. History, Physical, and Plan performed by medical provider. Documentation and orders reviewed and attested to.      Gerda Diss, [facility] Physician

## 2018-03-29 ENCOUNTER — Encounter: Payer: Self-pay | Admitting: Neurology

## 2018-03-29 ENCOUNTER — Other Ambulatory Visit: Payer: Self-pay | Admitting: *Deleted

## 2018-03-29 DIAGNOSIS — R52 Pain, unspecified: Secondary | ICD-10-CM

## 2018-04-04 ENCOUNTER — Ambulatory Visit (INDEPENDENT_AMBULATORY_CARE_PROVIDER_SITE_OTHER): Payer: Medicare HMO | Admitting: Neurology

## 2018-04-04 DIAGNOSIS — R52 Pain, unspecified: Secondary | ICD-10-CM | POA: Diagnosis not present

## 2018-04-04 DIAGNOSIS — M5417 Radiculopathy, lumbosacral region: Secondary | ICD-10-CM

## 2018-04-04 NOTE — Procedures (Signed)
[facility] Neurology  [place], [place]  [place], [place] [address] Tel: [phone] Fax:  [phone] Test Date:  04/04/2018  Patient: Edward Freeman DOB: 1950-11-22 Physician: Narda Amber, DO  Sex: Male Height: 5\' 9"  Ref Phys: Teresa Coombs, DO  ID#: [id number] Temp: 34.6C Technician:    Patient Complaints: This is a 67 year old man referred for evaluation of left-sided back and hip pain with radiating numbness into the left leg.  NCV & EMG Findings: Extensive electrodiagnostic testing of the left lower extremity shows:  1. Left sural and superficial peroneal sensory responses are within normal limits. 2. Left peroneal and tibial motor responses are within normal limits. 3. Left tibial H reflex studies are within normal limits. 4. Chronic motor axonal loss changes are seen affecting the anterior tibialis and rectus femoris muscles, without accompanied active denervation.  Impression: Chronic L4 radiculopathy affecting the left lower extremity, mild in degree electrically.   ___________________________ Narda Amber, DO    Nerve Conduction Studies Anti Sensory Summary Table   Site NR Peak (ms) Norm Peak (ms) P-T Amp (V) Norm P-T Amp  Left Sup Peroneal Anti Sensory (Ant Lat Mall)  34.6C  12 cm    1.8 <4.6 4.2 >3  Left Sural Anti Sensory (Lat Mall)  34.6C  Calf    3.3 <4.6 7.7 >3   Motor Summary Table   Site NR Onset (ms) Norm Onset (ms) O-P Amp (mV) Norm O-P Amp Site1 Site2 Delta-0 (ms) Dist (cm) Vel (m/s) Norm Vel (m/s)  Left Peroneal Motor (Ext Dig Brev)  34.6C  Ankle    3.2 <6.0 2.7 >2.5 B Fib Ankle 8.4 37.0 44 >40  B Fib    11.6  2.3  Poplt B Fib 1.3 9.0 69 >40  Poplt    12.9  2.1         Left Peroneal TA Motor (Tib Ant)  34.6C  Fib Head    3.1 <4.5 5.6 >3 Poplit Fib Head 1.3 8.0 62 >40  Poplit    4.4  5.5         Left Tibial Motor (Abd Hall Brev)  34.6C  Ankle    5.0 <6.0 14.5 >4 Knee Ankle 8.3 39.0 47 >40  Knee    13.3  8.3          H Reflex  Studies   NR H-Lat (ms) Lat Norm (ms) L-R H-Lat (ms)  Left Tibial (Gastroc)  34.6C     34.69 <35    EMG   Side Muscle Ins Act Fibs Psw Fasc Number Recrt Dur Dur. Amp Amp. Poly Poly. Comment  Left AntTibialis Nml Nml Nml Nml 1- Rapid Few 1+ Few 1+ Nml Nml N/A  Left Gastroc Nml Nml Nml Nml Nml Nml Nml Nml Nml Nml Nml Nml N/A  Left Flex Dig Long Nml Nml Nml Nml Nml Nml Nml Nml Nml Nml Nml Nml N/A  Left RectFemoris Nml Nml Nml Nml 1- Rapid Some 1+ Some 1+ Nml Nml N/A  Left GluteusMed Nml Nml Nml Nml Nml Nml Nml Nml Nml Nml Nml Nml N/A  Left Lumbo Parasp Low Nml Nml Nml Nml Nml Nml Nml Nml Nml Nml Nml Nml N/A  Left AdductorLong Nml Nml Nml Nml Nml Nml Nml Nml Nml Nml Nml Nml N/A      Waveforms:

## 2018-04-09 ENCOUNTER — Encounter: Payer: Self-pay | Admitting: Sports Medicine

## 2018-04-19 ENCOUNTER — Ambulatory Visit (INDEPENDENT_AMBULATORY_CARE_PROVIDER_SITE_OTHER): Payer: Medicare HMO | Admitting: Sports Medicine

## 2018-04-19 ENCOUNTER — Encounter: Payer: Self-pay | Admitting: Sports Medicine

## 2018-04-19 VITALS — BP 124/86 | HR 55 | Ht 69.0 in | Wt 195.6 lb

## 2018-04-19 DIAGNOSIS — M5416 Radiculopathy, lumbar region: Secondary | ICD-10-CM

## 2018-04-19 DIAGNOSIS — M79605 Pain in left leg: Secondary | ICD-10-CM | POA: Diagnosis not present

## 2018-04-19 DIAGNOSIS — M479 Spondylosis, unspecified: Secondary | ICD-10-CM

## 2018-04-19 NOTE — Progress Notes (Signed)
Edward Freeman. Edward Freeman, Edward Freeman at [place]  Edward Freeman - 67 y.o. male MRN [id number]  Date of birth: 1951/04/14  Visit Date: 04/19/2018  PCP: Dorothyann Peng, NP   Referred by: Dorothyann Peng, NP  Scribe(s) for today's visit: Josepha Pigg, CMA  SUBJECTIVE:  Edward Freeman is here for Follow-up (L hip pain)   02/09/2018: His L knee pain symptoms INITIALLY: Began about 6 weeks ago and MOI is unknown. He has hx of sciatic nerve pain.  Described as severe aching and stabbing like "little needles" during flare-up, radiating to the L lower leg.  Worsened with sitting for prolonged periods of time or driving.  Improved with activity. Also improved when lying down with leg elevated.  Additional associated symptoms include: Pt also c/o L hip and lower back pain with L-sided sciatica.  He has noticed increased numbness around the knee and lower leg that comes and goes. He has also noticed groin pain. He denies clicking and popping but he has noticed swelling around the knee.    At this time symptoms are improving compared to onset  He has completed Medrol Dosepak with some relief. He has tried taking Flexeril, Percocet and Diazepam with some relief.   03/28/2018: Compared to the last office visit, his previously described symptoms show no change. He continues to have numbness from the knee to the mid calf and in the quads. He has noticed swelling around the knee. Occasionally he will have sharp pains "sticking" him in the leg and knee. He has hx of muscle spasms in both legs.  Current symptoms are moderate & are radiating to the thigh and calf.  He had been taking Flexeril, Percocet, and Diazepam but these have been d/c or completed course. Gabapentin was prescribed at his last visit but didn't notice much of a difference so he stopped taking it.  MRI was ordered but he has not had it done.  He reports that L hip pain and related sciatica  has improved.   04/19/2018: Compared to the last office visit, his previously described symptoms show no change, he continues to have numbness over the L knee.  Current symptoms are moderate & are radiating from the knee to the calf.  He has not been taking any medication for the pain recently.  He had nerve conduction study 04/04/2018 and would like to review these results.    REVIEW OF SYSTEMS: Denies night time disturbances. Denies fevers, chills, or night sweats. Denies unexplained weight loss. Denies personal history of cancer. Denies changes in bowel or bladder habits. Denies recent unreported falls. Denies new or worsening dyspnea or wheezing. Denies headaches or dizziness.  Reports numbness, tingling or weakness  In the extremities.  Denies dizziness or presyncopal episodes Reports lower extremity edema    HISTORY & PERTINENT PRIOR DATA:  Prior History reviewed and updated per electronic medical record.  Significant/pertinent history, findings, studies include:  reports that he has never smoked. He has never used smokeless tobacco. No results for input(s): HGBA1C, LABURIC, CREATINE in the last 8760 hours. No specialty comments available. Problem  Lumbar Radiculopathy    OBJECTIVE:  VS:  HT:5\' 9"  (175.3 cm)   WT:195 lb 9.6 oz (88.7 kg)  BMI:28.87    BP:124/86  HR:(Abnormal) 55bpm  TEMP: ( )  RESP:97 %   PHYSICAL EXAM: Constitutional: WDWN, Non-toxic appearing. Psychiatric: Alert & appropriately interactive.  Not depressed or anxious appearing. Respiratory: No increased work of  breathing.  Trachea Midline Eyes: Pupils are equal.  EOM intact without nystagmus.  No scleral icterus  Vascular Exam: warm to touch no edema  lower extremity neuro exam: He is a small amount of weakness with knee extension and difficulty with heel and toe walking.  Reflexes are diminished at the Achilles as well as patellar reflexes.  MSK Exam: No pain with straight leg raise.   Lower extremity strength is slightly diminished but otherwise no significant pain with internal or external rotation of the hip.  Negative Stinchfield test.   ASSESSMENT & PLAN:   1. Spondylosis   2. Pain of left lower extremity   3. Lumbar radiculopathy     PLAN: He does have some persistent weakness and pain.   EMG results reviewed with him today.  He shows a chronic alcohol radiculopathy.  This is mild in nature and he is gaining strength.  We discussed the options for neurosurgical evaluation as this point given the fact he is continuing to improve we will hold off given the mild findings.  Home therapeutic exercises prescribed today.  Discussed the foundation of treatment for this condition is physical therapy and/or daily (5-6 days/week) therapeutic exercises, focusing on core strengthening, coordination, neuromuscular control/reeducation.  Therapeutic exercises prescribed per procedure note.   Follow-up: Return in about 3 months (around 07/20/2018).      Please see additional documentation for Objective, Assessment and Plan sections. Pertinent additional documentation may be included in corresponding procedure notes, imaging studies, problem based documentation and patient instructions. Please see these sections of the encounter for additional information regarding this visit.  CMA/ATC served as Education administrator during this visit. History, Physical, and Plan performed by medical provider. Documentation and orders reviewed and attested to.      Gerda Diss, [facility] Physician

## 2018-04-19 NOTE — Progress Notes (Signed)
PROCEDURE NOTE: THERAPEUTIC EXERCISES (97110) 15 minutes spent for Therapeutic exercises as below and as referenced in the AVS.  This included exercises focusing on stretching, strengthening, with significant focus on eccentric aspects.   Proper technique shown and discussed handout in great detail with ATC.  All questions were discussed and answered.   Long term goals include an improvement in range of motion, strength, endurance as well as avoiding reinjury. Frequency of visits is one time as determined during today's  office visit. Frequency of exercises to be performed is as per handout.  EXERCISES REVIEWED:  Pelvic recruitment  Lumbar and thoracic spine stabilization exercises

## 2018-04-19 NOTE — Patient Instructions (Signed)
Please perform the exercise program that we have prepared for you and gone over in detail on a daily basis.  In addition to the handout you were provided you can access your program through: [email or web address]   Your unique program code is: [email or web address]

## 2018-05-01 ENCOUNTER — Other Ambulatory Visit: Payer: Self-pay | Admitting: Cardiovascular Disease

## 2018-05-12 ENCOUNTER — Encounter: Payer: Self-pay | Admitting: Sports Medicine

## 2018-05-12 DIAGNOSIS — M5416 Radiculopathy, lumbar region: Secondary | ICD-10-CM | POA: Insufficient documentation

## 2018-07-20 ENCOUNTER — Ambulatory Visit: Payer: Medicare HMO | Admitting: Sports Medicine

## 2018-07-27 ENCOUNTER — Ambulatory Visit (INDEPENDENT_AMBULATORY_CARE_PROVIDER_SITE_OTHER): Payer: Medicare HMO | Admitting: Sports Medicine

## 2018-07-27 ENCOUNTER — Encounter: Payer: Self-pay | Admitting: Sports Medicine

## 2018-07-27 VITALS — BP 140/80 | HR 55 | Ht 69.0 in | Wt 198.6 lb

## 2018-07-27 DIAGNOSIS — M25562 Pain in left knee: Secondary | ICD-10-CM | POA: Diagnosis not present

## 2018-07-27 DIAGNOSIS — M5416 Radiculopathy, lumbar region: Secondary | ICD-10-CM

## 2018-07-27 DIAGNOSIS — M479 Spondylosis, unspecified: Secondary | ICD-10-CM | POA: Diagnosis not present

## 2018-07-27 DIAGNOSIS — G8929 Other chronic pain: Secondary | ICD-10-CM

## 2018-07-27 NOTE — Progress Notes (Signed)
Edward Freeman. Edward Freeman, [place] [facility] [place]  Edward Freeman - 67 y.o. male MRN [id number]  Date of birth: 24-Feb-1951  Visit Date: 07/27/2018  PCP: Dorothyann Peng, NP   Referred by: Dorothyann Peng, NP  Scribe(s) for today's visit: Josepha Pigg, CMA  SUBJECTIVE:  Edward Freeman is here for Follow-up (LBP w/ L LE radiculopathy)   02/09/2018: His L knee pain symptoms INITIALLY: Began about 6 weeks ago and MOI is unknown. He has hx of sciatic nerve pain.  Described as severe aching and stabbing like "little needles" during flare-up, radiating to the L lower leg.  Worsened with sitting for prolonged periods of time or driving.  Improved with activity. Also improved when lying down with leg elevated.  Additional associated symptoms include: Pt also c/o L hip and lower back pain with L-sided sciatica.  He has noticed increased numbness around the knee and lower leg that comes and goes. He has also noticed groin pain. He denies clicking and popping but he has noticed swelling around the knee.    At this time symptoms are improving compared to onset  He has completed Medrol Dosepak with some relief. He has tried taking Flexeril, Percocet and Diazepam with some relief.   03/28/2018: Compared to the last office visit, his previously described symptoms show no change. He continues to have numbness from the knee to the mid calf and in the quads. He has noticed swelling around the knee. Occasionally he will have sharp pains "sticking" him in the leg and knee. He has hx of muscle spasms in both legs.  Current symptoms are moderate & are radiating to the thigh and calf.  He had been taking Flexeril, Percocet, and Diazepam but these have been d/c or completed course. Gabapentin was prescribed at his last visit but didn't notice much of a difference so he stopped taking it.  MRI was ordered but he has not had it done.  He reports that L hip pain and  related sciatica has improved.   04/19/2018: Compared to the last office visit, his previously described symptoms show no change, he continues to have numbness over the L knee.  Current symptoms are moderate & are radiating from the knee to the calf.  He has not been taking any medication for the pain recently.  He had nerve conduction study 04/04/2018 and would like to review these results.   07/27/2018: Compared to the last office visit on 04/19/18, his previously described LBP are improved but his L LE is still bothering him particularly in his L knee and proximal lower leg.  He is having pain in the L ant knee that radiates into the medial knee and lower leg.  He also con't to get N/T in his L lower leg from the knee to the mid-lower leg. Current symptoms are mild & are radiating to L lower extremity to the mid-lower leg. He has not been taking any medications for his symptoms.  He states that he has not been doing his HEP but does go to the gym occasionally.  REVIEW OF SYSTEMS: Denies night time disturbances. Denies fevers, chills, or night sweats. Denies unexplained weight loss. Denies personal history of cancer. Denies changes in bowel or bladder habits. Denies recent unreported falls. Reports new or worsening dyspnea or wheezing - SOB w/ exercise Denies headaches or dizziness.  Reports numbness, tingling or weakness  In the extremities - in the L LE Denies dizziness  or presyncopal episodes Denies lower extremity edema    HISTORY:  Prior history reviewed and updated per electronic medical record.  Social History   Occupational History  . Not on file  Tobacco Use  . Smoking status: Never Smoker  . Smokeless tobacco: Never Used  Substance and Sexual Activity  . Alcohol use: No    Comment: 1-2 beers a month  . Drug use: No  . Sexual activity: Not on file   Social History   Social History Narrative  . Not on file    DATA OBTAINED & REVIEWED:  No results for input(s):  HGBA1C, LABURIC, CREATINE in the last 8760 hours. . 02/03/2018: X-rays of the knee and back and show multilevel degenerative disc disease and normal appearing left knee. . Nerve conduction studies on 04/04/2018 show chronic L4 radiculopathy with left lower extremity that is mild in degree. .   OBJECTIVE:  VS:  HT:5\' 9"  (175.3 cm)   WT:198 lb 9.6 oz (90.1 kg)  BMI:29.31    BP:140/80  HR:(!) 55bpm  TEMP: ( )  RESP:97 %   PHYSICAL EXAM: CONSTITUTIONAL: Well-developed, Well-nourished and In no acute distress PSYCHIATRIC: Alert & appropriately interactive. and Not depressed or anxious appearing. RESPIRATORY: No increased work of breathing and Trachea Midline EYES: Pupils are equal., EOM intact without nystagmus. and No scleral icterus.  VASCULAR EXAM: Warm and well perfused NEURO: unremarkable  MSK Exam: Left leg  Well aligned, no significant deformity. No overlying skin changes. No focal bony tenderness   RANGE OF MOTION & STRENGTH  Normal flexion-extension of the knee.  Extensor mechanism on the left knee is significantly stronger than in the past.  4+/5 strength.  Dorsiflexion and plantarflexion strength is normal.  He walks with a normal gait.   SPECIALITY TESTING:  Left knee is overall well aligned.  No effusion.  He is ligamentously stable.  Negative McMurray's.  Slight dysesthesia over the genicular nerve pattern.  Negative Tinel's over the abductor canal.  Negative straight leg raise  His gait is significantly improved and walks with a normal heel toe gait.    ASSESSMENT   1. Lumbar radiculopathy   2. Spondylosis   3. Chronic pain of left knee     PLAN:  Pertinent additional documentation may be included in corresponding procedure notes, imaging studies, problem based documentation and patient instructions.  Procedures:  . None  Medications:  No orders of the defined types were placed in this encounter.  Discussion/Instructions: No problem-specific Assessment &  Plan notes found for this encounter.  Marland Kitchen He is done exceptionally well.  Continues to have some residual pain but may be reflective more of a double crush phenomenon to the abductor canal given the genicular nerve distribution pain.  We will plan to have him start capsaicin cream over the knee and follow-up as needed if any worsening features. . Discussed red flag symptoms that warrant earlier emergent evaluation and patient voices understanding. . Activity modifications and the importance of avoiding exacerbating activities (limiting pain to no more than a 4 / 10 during or following activity) recommended and discussed.  Follow-up:  . Return if symptoms worsen or fail to improve.   . If any lack of improvement consider: Reevaluation of intrinsic knee pathology versus follow-up for low back.  Can consider restarting gabapentin  . At follow up will plan to consider: Further diagnostic evaluation with MRI of the lumbar spine.     CMA/ATC served as Education administrator during this visit. History, Physical, and Plan  performed by medical provider. Documentation and orders reviewed and attested to.      Gerda Diss, [facility] Physician

## 2018-07-27 NOTE — Patient Instructions (Signed)
Try picking up capsaicin cream over-the-counter and using this 2-3 times per day.  Try rubbing it on the inside part of the knee and down across the front part of the knee.

## 2018-09-24 ENCOUNTER — Emergency Department (HOSPITAL_BASED_OUTPATIENT_CLINIC_OR_DEPARTMENT_OTHER): Payer: Medicare HMO

## 2018-09-24 ENCOUNTER — Other Ambulatory Visit: Payer: Self-pay

## 2018-09-24 ENCOUNTER — Inpatient Hospital Stay (HOSPITAL_BASED_OUTPATIENT_CLINIC_OR_DEPARTMENT_OTHER)
Admission: EM | Admit: 2018-09-24 | Discharge: 2018-09-26 | DRG: 247 | Disposition: A | Discharge status: Home / Self Care | Payer: Medicare HMO | Attending: Cardiology | Admitting: Cardiology

## 2018-09-24 ENCOUNTER — Encounter (HOSPITAL_BASED_OUTPATIENT_CLINIC_OR_DEPARTMENT_OTHER): Payer: Self-pay | Admitting: Emergency Medicine

## 2018-09-24 DIAGNOSIS — I200 Unstable angina: Secondary | ICD-10-CM

## 2018-09-24 DIAGNOSIS — K219 Gastro-esophageal reflux disease without esophagitis: Secondary | ICD-10-CM | POA: Diagnosis present

## 2018-09-24 DIAGNOSIS — I214 Non-ST elevation (NSTEMI) myocardial infarction: Secondary | ICD-10-CM | POA: Diagnosis present

## 2018-09-24 DIAGNOSIS — Z833 Family history of diabetes mellitus: Secondary | ICD-10-CM | POA: Diagnosis not present

## 2018-09-24 DIAGNOSIS — Z1211 Encounter for screening for malignant neoplasm of colon: Secondary | ICD-10-CM

## 2018-09-24 DIAGNOSIS — Z8249 Family history of ischemic heart disease and other diseases of the circulatory system: Secondary | ICD-10-CM | POA: Diagnosis not present

## 2018-09-24 DIAGNOSIS — I472 Ventricular tachycardia: Secondary | ICD-10-CM | POA: Diagnosis present

## 2018-09-24 DIAGNOSIS — R079 Chest pain, unspecified: Secondary | ICD-10-CM | POA: Diagnosis present

## 2018-09-24 DIAGNOSIS — I2510 Atherosclerotic heart disease of native coronary artery without angina pectoris: Secondary | ICD-10-CM | POA: Diagnosis present

## 2018-09-24 DIAGNOSIS — I10 Essential (primary) hypertension: Secondary | ICD-10-CM | POA: Diagnosis present

## 2018-09-24 DIAGNOSIS — E785 Hyperlipidemia, unspecified: Secondary | ICD-10-CM | POA: Diagnosis present

## 2018-09-24 DIAGNOSIS — E876 Hypokalemia: Secondary | ICD-10-CM | POA: Diagnosis present

## 2018-09-24 DIAGNOSIS — R0789 Other chest pain: Secondary | ICD-10-CM | POA: Diagnosis not present

## 2018-09-24 DIAGNOSIS — Z7982 Long term (current) use of aspirin: Secondary | ICD-10-CM | POA: Diagnosis not present

## 2018-09-24 DIAGNOSIS — I495 Sick sinus syndrome: Secondary | ICD-10-CM | POA: Diagnosis not present

## 2018-09-24 DIAGNOSIS — Z8679 Personal history of other diseases of the circulatory system: Secondary | ICD-10-CM

## 2018-09-24 DIAGNOSIS — Z955 Presence of coronary angioplasty implant and graft: Secondary | ICD-10-CM

## 2018-09-24 DIAGNOSIS — Z9861 Coronary angioplasty status: Secondary | ICD-10-CM

## 2018-09-24 DIAGNOSIS — R001 Bradycardia, unspecified: Secondary | ICD-10-CM

## 2018-09-24 HISTORY — DX: Supraventricular tachycardia, unspecified: I47.10

## 2018-09-24 HISTORY — DX: Bradycardia, unspecified: R00.1

## 2018-09-24 HISTORY — DX: Atherosclerotic heart disease of native coronary artery without angina pectoris: I25.10

## 2018-09-24 HISTORY — DX: Supraventricular tachycardia: I47.1

## 2018-09-24 HISTORY — DX: Hyperlipidemia, unspecified: E78.5

## 2018-09-24 LAB — BASIC METABOLIC PANEL
Anion gap: 10 (ref 5–15)
BUN: 13 mg/dL (ref 8–23)
CHLORIDE: 107 mmol/L (ref 98–111)
CO2: 24 mmol/L (ref 22–32)
Calcium: 8.9 mg/dL (ref 8.9–10.3)
Creatinine, Ser: 0.96 mg/dL (ref 0.61–1.24)
Glucose, Bld: 137 mg/dL — ABNORMAL HIGH (ref 70–99)
Potassium: 2.9 mmol/L — ABNORMAL LOW (ref 3.5–5.1)
SODIUM: 141 mmol/L (ref 135–145)

## 2018-09-24 LAB — CBC
HEMATOCRIT: 46 % (ref 39.0–52.0)
Hemoglobin: 15.3 g/dL (ref 13.0–17.0)
MCH: 29.4 pg (ref 26.0–34.0)
MCHC: 33.3 g/dL (ref 30.0–36.0)
MCV: 88.5 fL (ref 80.0–100.0)
Platelets: 211 10*3/uL (ref 150–400)
RBC: 5.2 MIL/uL (ref 4.22–5.81)
RDW: 13.2 % (ref 11.5–15.5)
WBC: 7.1 10*3/uL (ref 4.0–10.5)
nRBC: 0 % (ref 0.0–0.2)

## 2018-09-24 LAB — HEPARIN LEVEL (UNFRACTIONATED): Heparin Unfractionated: 0.92 IU/mL — ABNORMAL HIGH (ref 0.30–0.70)

## 2018-09-24 LAB — TROPONIN I
TROPONIN I: 0.05 ng/mL — AB (ref <0.03)
TROPONIN I: 0.83 ng/mL — AB (ref <0.03)
Troponin I: <0.03 ng/mL (ref <0.03)
Troponin I: 0.19 ng/mL (ref <0.03)

## 2018-09-24 LAB — TSH: TSH: 0.642 u[IU]/mL (ref 0.350–4.500)

## 2018-09-24 MED ORDER — SODIUM CHLORIDE 0.9 % IV SOLN
INTRAVENOUS | Status: DC | PRN
  Administered 2018-09-24: 1000 mL via INTRAVENOUS

## 2018-09-24 MED ORDER — ACETAMINOPHEN 325 MG PO TABS
650.0000 mg | ORAL_TABLET | ORAL | Status: DC | PRN
  Administered 2018-09-24 – 2018-09-25 (×2): 650 mg via ORAL
  Filled 2018-09-24 (×2): qty 2

## 2018-09-24 MED ORDER — ATORVASTATIN CALCIUM 80 MG PO TABS
80.0000 mg | ORAL_TABLET | Freq: Every day | ORAL | Status: DC
  Administered 2018-09-25: 22:00:00 80 mg via ORAL
  Filled 2018-09-24: qty 1

## 2018-09-24 MED ORDER — ONDANSETRON HCL 4 MG/2ML IJ SOLN: 4.0000 mg | Freq: Four times a day (QID) | INTRAMUSCULAR | Status: DC | PRN

## 2018-09-24 MED ORDER — NITROGLYCERIN IN D5W 200-5 MCG/ML-% IV SOLN
0.0000 ug/min | INTRAVENOUS | Status: DC
  Administered 2018-09-24: 30 ug/min via INTRAVENOUS
  Filled 2018-09-24: qty 250

## 2018-09-24 MED ORDER — POTASSIUM CHLORIDE CRYS ER 20 MEQ PO TBCR
40.0000 meq | EXTENDED_RELEASE_TABLET | Freq: Once | ORAL | Status: AC
  Administered 2018-09-24: 40 meq via ORAL
  Filled 2018-09-24: qty 2

## 2018-09-24 MED ORDER — MORPHINE SULFATE (PF) 4 MG/ML IV SOLN
4.0000 mg | Freq: Once | INTRAVENOUS | Status: AC
  Administered 2018-09-24: 4 mg via INTRAVENOUS
  Filled 2018-09-24 (×2): qty 1

## 2018-09-24 MED ORDER — ASPIRIN 81 MG PO CHEW
162.0000 mg | CHEWABLE_TABLET | Freq: Once | ORAL | Status: AC
  Administered 2018-09-24: 162 mg via ORAL
  Filled 2018-09-24: qty 2

## 2018-09-24 MED ORDER — HEPARIN (PORCINE) 25000 UT/250ML-% IV SOLN
1000.0000 [IU]/h | INTRAVENOUS | Status: DC
  Administered 2018-09-24: 1350 [IU]/h via INTRAVENOUS
  Administered 2018-09-25: 1200 [IU]/h via INTRAVENOUS
  Filled 2018-09-24 (×2): qty 250

## 2018-09-24 MED ORDER — ASPIRIN EC 81 MG PO TBEC: 81.0000 mg | DELAYED_RELEASE_TABLET | Freq: Every day | ORAL | Status: DC

## 2018-09-24 MED ORDER — MORPHINE SULFATE (PF) 2 MG/ML IV SOLN: 1.0000 mg | INTRAVENOUS | Status: DC | PRN

## 2018-09-24 MED ORDER — METOPROLOL TARTRATE 12.5 MG HALF TABLET
12.5000 mg | ORAL_TABLET | Freq: Two times a day (BID) | ORAL | Status: DC
  Administered 2018-09-24: 12.5 mg via ORAL
  Filled 2018-09-24: qty 1

## 2018-09-24 MED ORDER — NITROGLYCERIN IN D5W 200-5 MCG/ML-% IV SOLN
0.0000 ug/min | INTRAVENOUS | Status: DC
  Administered 2018-09-24: 5 ug/min via INTRAVENOUS
  Filled 2018-09-24: qty 250

## 2018-09-24 MED ORDER — HEPARIN BOLUS VIA INFUSION
4000.0000 [IU] | Freq: Once | INTRAVENOUS | Status: AC
  Administered 2018-09-24: 4000 [IU] via INTRAVENOUS

## 2018-09-24 MED ORDER — NITROGLYCERIN 0.4 MG SL SUBL: 0.4000 mg | SUBLINGUAL_TABLET | SUBLINGUAL | Status: DC | PRN

## 2018-09-24 NOTE — H&P (Signed)
History and Physical   Patient ID: Edward Freeman, MRN: [id number], DOB: 1951/02/03   Date of Encounter: 09/24/2018, 6:18 PM  Primary Care Provider: Dorothyann Peng, NP Chief Complaint:  Chest discomfort  History of Present Illness: Edward Freeman is a 67 y.o. male w/ no significant prior cardiac hx other than PSVT and palpitations, with h/o CP and negative stress test and echo in Jan 2019 presents w/ chest discomfort, onset this AM. Onset was at rest; pt was epigastric/midsternal "burning" pain that had random/sudden onset, accompanied by diaphoresis, SOB, nausea and radiation to L arm. It lasted 30-45 min and so pt decided to come to ED for further evaluation. He says NTG made it somewhat better but did not completely alleviate the discomfort. He has not had CP prior to today, but he has noticed DOE that has been gradually worsening over the past several months.  Past Medical History:  Diagnosis Date  . Dyspnea on exertion   . GERD (gastroesophageal reflux disease)   . Hypertension 01/14/10   myocardial imaging- no evidence of ischemia or infarction. EF 53%  . Migraines   . Nodule of left lung   . Normal coronary arteries    by cardiac catheterization which I performed 11/04/03  . Other malaise and fatigue   . Syncope and collapse     History reviewed. No pertinent surgical history.   Prior to Admission medications   Medication Sig Start Date End Date Taking? Authorizing Provider  amLODipine (NORVASC) 10 MG tablet TAKE 1 TABLET (10 MG TOTAL) BY MOUTH DAILY. Patient not taking: Reported on 07/27/2018 05/02/18   Lorretta Harp, MD  aspirin 81 MG tablet Take 81 mg by mouth every other day.     [provider]  diazepam (VALIUM) 5 MG tablet Take 1 tablet (5 mg total) by mouth every 8 (eight) hours as needed for muscle spasms. Patient not taking: Reported on 04/19/2018 02/03/18   Dorothyann Peng, NP  finasteride (PROSCAR) 5 MG tablet Take 1 tablet (5 mg total) by mouth  daily. Patient not taking: Reported on 07/27/2018 05/30/17   Dorothyann Peng, NP  oxyCODONE-acetaminophen (PERCOCET/ROXICET) 5-325 MG tablet Take 1 tablet by mouth every 4 (four) hours as needed for severe pain. Patient not taking: Reported on 04/19/2018 01/28/18   Robyn Haber, MD  tadalafil (CIALIS) 20 MG tablet Take 1 tablet (20 mg total) by mouth daily as needed for erectile dysfunction. Patient not taking: Reported on 07/27/2018 01/17/18   Dorothyann Peng, NP     Allergies: No Known Allergies  Social History:  The patient  reports that he has never smoked. He has never used smokeless tobacco. He reports that he does not drink alcohol or use drugs.   Family History:  The patient's family history includes Alcohol abuse in his father; Diabetes in his brother, sister, and sister; Hypertension in his mother; Stroke in his mother.   ROS:  Please see the history of present illness.     All other systems reviewed and negative.   Vital Signs: Blood pressure (!) 142/69, pulse 60, temperature 98.6 F (37 C), temperature source Oral, resp. rate 20, height 5\' 11"  (1.803 m), weight 99.8 kg, SpO2 99 %.  PHYSICAL EXAM: General:  Well nourished, well developed, in no acute distress HEENT: normal Lymph: no adenopathy Neck: no JVD Endocrine:  No thryomegaly Vascular: No carotid bruits; DP pulses 2+ bilaterally  Cardiac:  normal S1, S2; RRR; no murmur  Lungs:  clear to auscultation bilaterally, no wheezing,  rhonchi or rales  Abd: soft, nontender, no hepatomegaly  Ext: no edema Musculoskeletal:  No deformities, BUE and BLE strength normal and equal Skin: warm and dry  Neuro:  CNs 2-12 intact, no focal abnormalities noted Psych:  Normal affect   EKG:  NSR with lateral TWI, more prominent than when compared to 2017  Labs:   Lab Results  Component Value Date   WBC 7.1 09/24/2018   HGB 15.3 09/24/2018   HCT 46.0 09/24/2018   MCV 88.5 09/24/2018   PLT 211 09/24/2018   Recent Labs  Lab  09/24/18 1239  NA 141  K 2.9*  CL 107  CO2 24  BUN 13  CREATININE 0.96  CALCIUM 8.9  GLUCOSE 137*   Recent Labs    09/24/18 1239 09/24/18 1541  TROPONINI <0.03 0.05*   Troponin (Point of Care Test) No results for input(s): TROPIPOC in the last 72 hours.  Lab Results  Component Value Date   CHOL 203 (H) 10/21/2017   HDL 65 10/21/2017   LDLCALC 125 (H) 10/21/2017   TRIG 64 10/21/2017   Lab Results  Component Value Date   DDIMER  01/14/2010    0.25        AT THE INHOUSE ESTABLISHED CUTOFF VALUE OF 0.48 ug/mL FEU, THIS ASSAY HAS BEEN DOCUMENTED IN THE LITERATURE TO HAVE A SENSITIVITY AND NEGATIVE PREDICTIVE VALUE OF AT LEAST 98 TO 99%.  THE TEST RESULT SHOULD BE CORRELATED WITH AN ASSESSMENT OF THE CLINICAL PROBABILITY OF DVT / VTE.    Radiology/Studies:  Dg Chest 2 View  Result Date: 09/24/2018 CLINICAL DATA:  Chest pain, weakness, diaphoresis EXAM: CHEST - 2 VIEW COMPARISON:  01/14/2010 FINDINGS: Lungs are clear. No pleural effusion or pneumothorax. The heart is normal in size. Visualized osseous structures are within normal limits. IMPRESSION: Normal chest radiographs. Electronically Signed   By: Julian Hy M.D.   On: 09/24/2018 12:58    TTE 11-10-17 Left ventricle: The cavity size was normal. Systolic function was   normal. The estimated ejection fraction was in the range of 55%   to 60%. Wall motion was normal; there were no regional wall   motion abnormalities. Left ventricular diastolic function   parameters were normal. - Aortic valve: There was mild regurgitation. - Aortic root: The aortic root was normal in size. - Mitral valve: There was mild regurgitation. - Left atrium: The atrium was mildly dilated. - Right ventricle: The cavity size was normal. Wall thickness was   normal. Systolic function was normal. - Tricuspid valve: There was mild regurgitation. - Pulmonary arteries: Systolic pressure was within the normal   range. - Inferior vena  cava: The vessel was normal in size. - Pericardium, extracardiac: There was no pericardial effusion.  11-11-17 nuc stress test  Nuclear stress EF: 53%. No wall motion abnormality  There was no ST segment deviation noted during stress.  The study is normal. No perfusion defects.  This is a low risk study.  ASSESSMENT AND PLAN:   1. CP: possible NSTEMI? Trop minimally elevated. Will cycle enzymes. Stress test negative in Jan 2019; probably best to reeval w/ LHC, especially if trop rises. Will cont NTG gtt, heparin IV gtt. NPO after midnight. Will get TTE in the AM.  2. HTN/GERD: pt not really on any home meds. Starting BB, statin here. Will check A1c, TSH, FLP for screening.  Thank you for the opportunity to participate in the care of this patient. Will follow. Please call w/ questions.  Signed,  Rudean Curt, MD, [facility]  09/24/2018 6:18 PM

## 2018-09-24 NOTE — ED Notes (Signed)
Patient transported to X-ray 

## 2018-09-24 NOTE — ED Notes (Signed)
Report to [facility] with [facility]

## 2018-09-24 NOTE — ED Notes (Signed)
Pt states he does not want any pain medication at this time.

## 2018-09-24 NOTE — ED Notes (Signed)
ED Provider at bedside. 

## 2018-09-24 NOTE — ED Provider Notes (Signed)
Medical screening examination/treatment/procedure(s) were conducted as a shared visit with non-physician practitioner(s) and myself.  I personally evaluated the patient during the encounter.  EKG Interpretation  Date/Time:  Sunday September 24 2018 12:35:04 EST Ventricular Rate:  55 PR Interval:    QRS Duration: 97 QT Interval:  409 QTC Calculation: 392 R Axis:   21 Text Interpretation:  Sinus rhythm Minimal ST depression, lateral leads new T wave inversion aVL. T waves inferiorly more acute from previous. Confirmed by Charlesetta Shanks [phone]) on 09/24/2018 2:05:06 PM Patient awakened this morning asymptomatic.  Around 10:30 AM he started getting left-sided chest pain.  He had associated sweating and nausea with shortness of breath and general weakness.  He reports pain was severe initially.  It has decreased some since onset.  He took aspirin prior to arrival.  Patient is alert and appropriate.  He does not have respiratory distress at rest.  His mental status is clear.  Heart is regular.  Borderline bradycardia.  No gross rub murmur gallop.  Distal pulses are 2+.  Lungs clear without wheeze rhonchi or rale.  Abdomen is soft and nontender.  Lower extremities without peripheral edema.  Calves are soft and nontender.  Skin is warm and dry.  Patient is alert and appropriate with clear mental status.  Movements are coordinated purposeful symmetric.  Patient presents with risk factors and concerning history for ACS.  EKG shows changes of slight ST depression and T wave inversion new from previous and lateral leads.  Concern for ACS.  Cardiology has been consulted by Delane Ginger.  Patient will be started on nitroglycerin drip and heparin.  (Patient has been prescribed the medication in the past but reports he has never used it and has not had any recent use).  I agree with plan and management.   Charlesetta Shanks, MD 09/27/18 [phone]

## 2018-09-24 NOTE — Progress Notes (Signed)
ANTICOAGULATION CONSULT NOTE - Initial Consult  Pharmacy Consult for Heparin Indication: chest pain/ACS  No Known Allergies  Patient Measurements: Height: 5\' 11"  (180.3 cm) Weight: 220 lb (99.8 kg) IBW/kg (Calculated) : 75.3 Heparin Dosing Weight:  95.7 kg  Vital Signs: Temp: 97.6 F (36.4 C) (11/17 1240) Temp Source: Oral (11/17 1240) BP: 161/85 (11/17 1400) Pulse Rate: 58 (11/17 1400)  Labs: Recent Labs    09/24/18 1239  HGB 15.3  HCT 46.0  PLT 211  CREATININE 0.96  TROPONINI <0.03    Estimated Creatinine Clearance: 89.9 mL/min (by C-G formula based on SCr of 0.96 mg/dL).   Medical History: Past Medical History:  Diagnosis Date  . Dyspnea on exertion   . GERD (gastroesophageal reflux disease)   . Hypertension 01/14/10   myocardial imaging- no evidence of ischemia or infarction. EF 53%  . Migraines   . Nodule of left lung   . Normal coronary arteries    by cardiac catheterization which I performed 11/04/03  . Other malaise and fatigue   . Syncope and collapse    Assessment: CC/HPI: CP. Initial troponin <0.03  PMH: DOE, GERD, HTN, migraines, L lung nodule, syncope  Anticoag: CP. Baseline CBC WNL. IV heparin to start at [facility].  Goal of Therapy:  Heparin level 0.3-0.7 units/ml Monitor platelets by anticoagulation protocol: Yes   Plan:  Heparin 4000 unit IV bolus Heparin infusion at 1350 units/hr Check heparin level in 6-8 hrs Daily HL and CBC  Talon Regala S. Alford Highland, PharmD, BCPS Clinical Staff Pharmacist Eilene Ghazi [place]illing[place] 09/24/2018,2:38 PM

## 2018-09-24 NOTE — Progress Notes (Signed)
ANTICOAGULATION CONSULT NOTE   Pharmacy Consult for Heparin Indication: chest pain/ACS  No Known Allergies  Patient Measurements: Height: 5\' 11"  (180.3 cm) Weight: 220 lb (99.8 kg) IBW/kg (Calculated) : 75.3 Heparin Dosing Weight:  95.7 kg  Vital Signs: Temp: 98.1 F (36.7 C) (11/17 2025) Temp Source: Oral (11/17 2025) BP: 136/85 (11/17 2025) Pulse Rate: 58 (11/17 2025)  Labs: Recent Labs    09/24/18 1239 09/24/18 1541 09/24/18 1813 09/24/18 2108  HGB 15.3  --   --   --   HCT 46.0  --   --   --   PLT 211  --   --   --   HEPARINUNFRC  --   --   --  0.92*  CREATININE 0.96  --   --   --   TROPONINI <0.03 0.05* 0.19*  --     Estimated Creatinine Clearance: 89.9 mL/min (by C-G formula based on SCr of 0.96 mg/dL).   Medical History: Past Medical History:  Diagnosis Date  . Dyspnea on exertion   . GERD (gastroesophageal reflux disease)   . Hypertension 01/14/10   myocardial imaging- no evidence of ischemia or infarction. EF 53%  . Migraines   . Nodule of left lung   . Normal coronary arteries    by cardiac catheterization which I performed 11/04/03  . Other malaise and fatigue   . Syncope and collapse    Assessment: CC/HPI: CP. Initial troponin <0.03  PMH: DOE, GERD, HTN, migraines, L lung nodule, syncope  Anticoag: CP. Baseline CBC WNL. IV heparin to start at [facility]. Initial heparin level 0.92 units/ml  Goal of Therapy:  Heparin level 0.3-0.7 units/ml Monitor platelets by anticoagulation protocol: Yes   Plan:  Decrease heparin infusion to 1200 units/hr Check heparin level in 6-8 hrs Daily HL and CBC  Excell Seltzer, PharmD Clinical Staff Pharmacist Atiyah Bauer, [place] 09/24/2018,10:36 PM

## 2018-09-24 NOTE — ED Triage Notes (Signed)
Patient presents with mid sternum chest pain that started this morning upon waking.

## 2018-09-24 NOTE — ED Provider Notes (Addendum)
[facility] EMERGENCY DEPARTMENT Provider Note   CSN: [id number] Arrival date & time: 09/24/18  1227     History   Chief Complaint Chief Complaint  Patient presents with  . Chest Pain    HPI Edward Freeman is a 67 y.o. male who presents today for evaluation of chest pain.  He was feeling okay when he woke up, however at around 1030 he had sudden onset of substernal/left-sided chest pain radiating to his left axilla, with associated diaphoresis, nausea, dry heaves, shortness of breath, and feeling weak.  His pain at that time was 10 out of 10.  He currently reports his pain is a 8 out of 10.  He took 2 baby aspirin and his blood pressure medications prior to arrival.  He reports he is not supposed to be taking any potassium.  Dr. Alvester Chou is his cardiologist.  His risk factors include his age, hypertension.  HPI  Past Medical History:  Diagnosis Date  . Dyspnea on exertion   . GERD (gastroesophageal reflux disease)   . Hypertension 01/14/10   myocardial imaging- no evidence of ischemia or infarction. EF 53%  . Migraines   . Nodule of left lung   . Normal coronary arteries    by cardiac catheterization which I performed 11/04/03  . Other malaise and fatigue   . Syncope and collapse     Patient Active Problem List   Diagnosis Date Noted  . Lumbar radiculopathy 05/12/2018  . Spondylosis 03/28/2018  . Atypical chest pain 10/21/2017  . Palpitations 10/21/2017  . Hyperlipidemia 10/21/2015  . Essential hypertension 05/17/2013  . Dyspnea on exertion 05/17/2013  . Leg pain 05/17/2013    History reviewed. No pertinent surgical history.      Home Medications    Prior to Admission medications   Medication Sig Start Date End Date Taking? Authorizing Provider  amLODipine (NORVASC) 10 MG tablet TAKE 1 TABLET (10 MG TOTAL) BY MOUTH DAILY. Patient not taking: Reported on 07/27/2018 05/02/18   Lorretta Harp, MD  aspirin 81 MG tablet Take 81 mg by mouth every other  day.     [provider]  diazepam (VALIUM) 5 MG tablet Take 1 tablet (5 mg total) by mouth every 8 (eight) hours as needed for muscle spasms. Patient not taking: Reported on 04/19/2018 02/03/18   Dorothyann Peng, NP  finasteride (PROSCAR) 5 MG tablet Take 1 tablet (5 mg total) by mouth daily. Patient not taking: Reported on 07/27/2018 05/30/17   Dorothyann Peng, NP  oxyCODONE-acetaminophen (PERCOCET/ROXICET) 5-325 MG tablet Take 1 tablet by mouth every 4 (four) hours as needed for severe pain. Patient not taking: Reported on 04/19/2018 01/28/18   Robyn Haber, MD  tadalafil (CIALIS) 20 MG tablet Take 1 tablet (20 mg total) by mouth daily as needed for erectile dysfunction. Patient not taking: Reported on 07/27/2018 01/17/18   Dorothyann Peng, NP    Family History Family History  Problem Relation Age of Onset  . Hypertension Mother   . Stroke Mother   . Alcohol abuse Father   . Diabetes Sister   . Diabetes Brother   . Diabetes Sister   . Colon cancer Neg Hx   . Esophageal cancer Neg Hx   . Rectal cancer Neg Hx   . Stomach cancer Neg Hx     Social History Social History   Tobacco Use  . Smoking status: Never Smoker  . Smokeless tobacco: Never Used  Substance Use Topics  . Alcohol use: No  Comment: 1-2 beers a month  . Drug use: No     Allergies   Patient has no known allergies.   Review of Systems Review of Systems  Constitutional: Positive for diaphoresis.  Respiratory: Positive for chest tightness and shortness of breath. Negative for wheezing.   Cardiovascular: Positive for chest pain. Negative for leg swelling.  Gastrointestinal: Positive for nausea and vomiting. Negative for abdominal pain.  Genitourinary: Negative for dysuria.  Neurological: Positive for weakness (Generalized). Negative for numbness.     Physical Exam Updated Vital Signs BP (!) 161/85   Pulse (!) 58   Temp 97.6 F (36.4 C) (Oral)   Resp 15   Ht 5\' 11"  (1.803 m)   Wt 99.8 kg    SpO2 99%   BMI 30.68 kg/m   Physical Exam  Constitutional: He is oriented to person, place, and time. He appears well-developed and well-nourished. He appears ill.  HENT:  Head: Normocephalic and atraumatic.  Eyes: Conjunctivae are normal.  Neck: Normal range of motion. Neck supple. No JVD present.  Cardiovascular: Normal rate, regular rhythm, intact distal pulses and normal pulses.  No murmur heard. Pulmonary/Chest: Effort normal and breath sounds normal. No accessory muscle usage. No respiratory distress. He has no decreased breath sounds.  Abdominal: Soft. Bowel sounds are normal. He exhibits no distension. There is no tenderness.  Musculoskeletal: Normal range of motion. He exhibits no edema.       Right lower leg: Normal. He exhibits no tenderness and no edema.       Left lower leg: Normal. He exhibits no tenderness and no edema.  Neurological: He is alert and oriented to person, place, and time.  Skin: Skin is warm and dry.  Psychiatric: He has a normal mood and affect.  Nursing note and vitals reviewed.    ED Treatments / Results  Labs (all labs ordered are listed, but only abnormal results are displayed) Labs Reviewed  BASIC METABOLIC PANEL - Abnormal; Notable for the following components:      Result Value   Potassium 2.9 (*)    Glucose, Bld 137 (*)    All other components within normal limits  CBC  TROPONIN I    EKG EKG Interpretation  Date/Time:  Sunday September 24 2018 12:35:04 EST Ventricular Rate:  55 PR Interval:    QRS Duration: 97 QT Interval:  409 QTC Calculation: 392 R Axis:   21 Text Interpretation:  Sinus rhythm Minimal ST depression, lateral leads new T wave inversion aVL. T waves inferiorly more acute from previous. Confirmed by Charlesetta Shanks [phone]) on 09/24/2018 2:05:06 PM   Radiology Dg Chest 2 View  Result Date: 09/24/2018 CLINICAL DATA:  Chest pain, weakness, diaphoresis EXAM: CHEST - 2 VIEW COMPARISON:  01/14/2010 FINDINGS: Lungs  are clear. No pleural effusion or pneumothorax. The heart is normal in size. Visualized osseous structures are within normal limits. IMPRESSION: Normal chest radiographs. Electronically Signed   By: Julian Hy M.D.   On: 09/24/2018 12:58    Procedures Procedures (including critical care time) CRITICAL CARE Performed by: Wyn Quaker Total critical care time: 40 minutes Critical care time was exclusive of separately billable procedures and treating other patients. Critical care was necessary to treat or prevent imminent or life-threatening deterioration. Critical care was time spent personally by me on the following activities: development of treatment plan with patient and/or surrogate as well as nursing, discussions with consultants, evaluation of patient's response to treatment, examination of patient, obtaining history from patient or surrogate,  ordering and performing treatments and interventions, ordering and review of laboratory studies, ordering and review of radiographic studies, pulse oximetry and re-evaluation of patient's condition. Chest pain, concern for ischemic cause.  Heparin and nitro drip.   Medications Ordered in ED Medications  nitroGLYCERIN 50 mg in dextrose 5 % 250 mL (0.2 mg/mL) infusion (30 mcg/min Intravenous New Bag/Given 09/24/18 1951)  potassium chloride SA (K-DUR,KLOR-CON) CR tablet 40 mEq (40 mEq Oral Given 09/24/18 1333)  aspirin chewable tablet 162 mg (162 mg Oral Given 09/24/18 1406)  heparin bolus via infusion 4,000 Units (4,000 Units Intravenous Bolus from Bag 09/24/18 1455)  morphine 4 MG/ML injection 4 mg (4 mg Intravenous Given 09/24/18 1751)     Initial Impression / Assessment and Plan / ED Course  I have reviewed the triage vital signs and the nursing notes.  Pertinent labs & imaging results that were available during my care of the patient were reviewed by me and considered in my medical decision making (see chart for  details).  Clinical Course as of Sep 25 2007  Nancy Fetter Sep 24, 2018  1415 Spoke with Dr. Liane Comber from cardiology, she says that he has new changes in the lateral leads.  Request that he be started on a heparin and nitro drip and transferred to [facility].      [EH]  1429 Patient and family updated on plan, medications.  Patient has Cialis prescribed to him, however he says that he is never taken this, does not take Viagra either.   [EH]    Clinical Course User Index [EH] Lorin Glass, PA-C   Patient presents today for evaluation of chest pain.  This started at 10 AM to 10:30 AM with associated diaphoresis, nausea, dry heaves, feeling lightheaded, shortness of breath, and pain radiating into his left axilla.  EKG shows new changes in the lateral leads without frank ST elevation.  Concern for unstable angina or cardiac cause for his chest pain.  I spoke with Dr. Meda Coffee from cardiology.  Initial troponin is normal.  His potassium was low at 2.9, was orally repleted.  Cardiology Dr. Meda Coffee requested starting patient on heparin and nitro drip with transfer of patient to [facility].  This patient was seen as a shared visit with Dr. Vallery Ridge.  Chest x-ray without evidence of pneumothorax, pneumonia, or other abnormality.    Patient transferred to [facility] [employer].   Final Clinical Impressions(s) / ED Diagnoses   Final diagnoses:  Unstable angina [facility])  Chest pain, unspecified type    ED Discharge Orders    None       Ollen Gross 09/24/18 2009    Charlesetta Shanks, MD 09/27/18 0813    Lorin Glass, PA-C 10/03/18 1637    Charlesetta Shanks, MD 10/05/18 1311

## 2018-09-24 NOTE — ED Notes (Addendum)
Date and time results received: 09/24/18 1632  Test: trp Critical Value: 0.05 Name of Provider Notified:carelink RN notified Dr.Nelson paged for return call  Orders Received? Or Actions Taken?:

## 2018-09-24 NOTE — ED Notes (Signed)
Sam, RN at [facility] informed of critical trop. Pt en route to unit.

## 2018-09-24 NOTE — ED Notes (Signed)
Pt states he woke up later than normal and had coffee and roll for breakfast. Wife states he was quiet and then he told her his chest hurt but stated to her that it didn't feel like his heart after she asked if it felt like heart or muscular. Pt states the pain is constant. Denies diarrhea, but has sob, and states he feels weak and was diaphoretic.

## 2018-09-24 NOTE — ED Notes (Signed)
RN Sam not available when calling report to floor 6E

## 2018-09-25 ENCOUNTER — Encounter (HOSPITAL_COMMUNITY): Payer: Self-pay | Admitting: Physician Assistant

## 2018-09-25 ENCOUNTER — Encounter (HOSPITAL_COMMUNITY)
Admission: EM | Disposition: A | Discharge status: Home / Self Care | Payer: Self-pay | Source: Home / Self Care | Attending: Cardiology

## 2018-09-25 ENCOUNTER — Ambulatory Visit (HOSPITAL_COMMUNITY): Payer: Medicare HMO

## 2018-09-25 DIAGNOSIS — Z833 Family history of diabetes mellitus: Secondary | ICD-10-CM | POA: Diagnosis not present

## 2018-09-25 DIAGNOSIS — E876 Hypokalemia: Secondary | ICD-10-CM

## 2018-09-25 DIAGNOSIS — I200 Unstable angina: Secondary | ICD-10-CM | POA: Diagnosis not present

## 2018-09-25 DIAGNOSIS — R079 Chest pain, unspecified: Secondary | ICD-10-CM | POA: Diagnosis present

## 2018-09-25 DIAGNOSIS — E785 Hyperlipidemia, unspecified: Secondary | ICD-10-CM

## 2018-09-25 DIAGNOSIS — I2510 Atherosclerotic heart disease of native coronary artery without angina pectoris: Secondary | ICD-10-CM

## 2018-09-25 DIAGNOSIS — I10 Essential (primary) hypertension: Secondary | ICD-10-CM | POA: Diagnosis present

## 2018-09-25 DIAGNOSIS — Z8249 Family history of ischemic heart disease and other diseases of the circulatory system: Secondary | ICD-10-CM | POA: Diagnosis not present

## 2018-09-25 DIAGNOSIS — Z8679 Personal history of other diseases of the circulatory system: Secondary | ICD-10-CM

## 2018-09-25 DIAGNOSIS — I472 Ventricular tachycardia: Secondary | ICD-10-CM | POA: Diagnosis present

## 2018-09-25 DIAGNOSIS — R001 Bradycardia, unspecified: Secondary | ICD-10-CM

## 2018-09-25 DIAGNOSIS — I214 Non-ST elevation (NSTEMI) myocardial infarction: Secondary | ICD-10-CM

## 2018-09-25 DIAGNOSIS — I495 Sick sinus syndrome: Secondary | ICD-10-CM

## 2018-09-25 DIAGNOSIS — K219 Gastro-esophageal reflux disease without esophagitis: Secondary | ICD-10-CM | POA: Diagnosis present

## 2018-09-25 DIAGNOSIS — Z9861 Coronary angioplasty status: Secondary | ICD-10-CM

## 2018-09-25 DIAGNOSIS — Z7982 Long term (current) use of aspirin: Secondary | ICD-10-CM | POA: Diagnosis not present

## 2018-09-25 HISTORY — PX: LEFT HEART CATH AND CORONARY ANGIOGRAPHY: CATH118249

## 2018-09-25 HISTORY — PX: CORONARY STENT INTERVENTION: CATH118234

## 2018-09-25 LAB — BASIC METABOLIC PANEL
Anion gap: 4 — ABNORMAL LOW (ref 5–15)
Anion gap: 8 (ref 5–15)
BUN: 12 mg/dL (ref 8–23)
BUN: 12 mg/dL (ref 8–23)
CHLORIDE: 106 mmol/L (ref 98–111)
CHLORIDE: 112 mmol/L — AB (ref 98–111)
CO2: 22 mmol/L (ref 22–32)
CO2: 20 mmol/L — AB (ref 22–32)
CREATININE: 0.85 mg/dL (ref 0.61–1.24)
Calcium: 8 mg/dL — ABNORMAL LOW (ref 8.9–10.3)
Calcium: 8.5 mg/dL — ABNORMAL LOW (ref 8.9–10.3)
Creatinine, Ser: 0.96 mg/dL (ref 0.61–1.24)
GFR calc Af Amer: >60 mL/min (ref >60)
GFR calc non Af Amer: >60 mL/min (ref >60)
GFR calc non Af Amer: >60 mL/min (ref >60)
GLUCOSE: 151 mg/dL — AB (ref 70–99)
Glucose, Bld: 132 mg/dL — ABNORMAL HIGH (ref 70–99)
POTASSIUM: 4.1 mmol/L (ref 3.5–5.1)
Potassium: 6 mmol/L — ABNORMAL HIGH (ref 3.5–5.1)
SODIUM: 140 mmol/L (ref 135–145)
Sodium: 132 mmol/L — ABNORMAL LOW (ref 135–145)

## 2018-09-25 LAB — CBC
HCT: 42.7 % (ref 39.0–52.0)
HEMOGLOBIN: 13.6 g/dL (ref 13.0–17.0)
MCH: 28 pg (ref 26.0–34.0)
MCHC: 31.9 g/dL (ref 30.0–36.0)
MCV: 88 fL (ref 80.0–100.0)
NRBC: 0 % (ref 0.0–0.2)
Platelets: 214 10*3/uL (ref 150–400)
RBC: 4.85 MIL/uL (ref 4.22–5.81)
RDW: 13.3 % (ref 11.5–15.5)
WBC: 9.7 10*3/uL (ref 4.0–10.5)

## 2018-09-25 LAB — MRSA PCR SCREENING: MRSA by PCR: NEGATIVE

## 2018-09-25 LAB — LIPID PANEL
CHOL/HDL RATIO: 3.4 ratio
Cholesterol: 195 mg/dL (ref 0–200)
HDL: 58 mg/dL (ref >40)
LDL Cholesterol: 128 mg/dL — ABNORMAL HIGH (ref 0–99)
TRIGLYCERIDES: 43 mg/dL (ref <150)
VLDL: 9 mg/dL (ref 0–40)

## 2018-09-25 LAB — HEPARIN LEVEL (UNFRACTIONATED): Heparin Unfractionated: 0.94 IU/mL — ABNORMAL HIGH (ref 0.30–0.70)

## 2018-09-25 LAB — PROTIME-INR
INR: 1.14
Prothrombin Time: 14.5 seconds (ref 11.4–15.2)

## 2018-09-25 LAB — HEMOGLOBIN A1C
Hgb A1c MFr Bld: 5.6 % (ref 4.8–5.6)
Mean Plasma Glucose: 114.02 mg/dL

## 2018-09-25 LAB — ECHOCARDIOGRAM COMPLETE
Height: 71 in
Weight: 3115.2 oz

## 2018-09-25 LAB — HIV ANTIBODY (ROUTINE TESTING W REFLEX): HIV SCREEN 4TH GENERATION: NONREACTIVE

## 2018-09-25 LAB — POCT ACTIVATED CLOTTING TIME: Activated Clotting Time: 389 seconds

## 2018-09-25 LAB — MAGNESIUM: Magnesium: 2 mg/dL (ref 1.7–2.4)

## 2018-09-25 LAB — TROPONIN I: Troponin I: 12.75 ng/mL (ref <0.03)

## 2018-09-25 SURGERY — LEFT HEART CATH AND CORONARY ANGIOGRAPHY
Anesthesia: LOCAL

## 2018-09-25 MED ORDER — TIROFIBAN HCL IN NACL 5-0.9 MG/100ML-% IV SOLN
INTRAVENOUS | Status: AC
  Filled 2018-09-25: qty 100

## 2018-09-25 MED ORDER — SODIUM CHLORIDE 0.9% FLUSH: 3.0000 mL | INTRAVENOUS | Status: DC | PRN

## 2018-09-25 MED ORDER — SODIUM CHLORIDE 0.9 % IV SOLN
INTRAVENOUS | Status: AC
  Administered 2018-09-25: 12:00:00 via INTRAVENOUS

## 2018-09-25 MED ORDER — ASPIRIN 81 MG PO CHEW
81.0000 mg | CHEWABLE_TABLET | ORAL | Status: AC
  Administered 2018-09-25: 81 mg via ORAL
  Filled 2018-09-25: qty 1

## 2018-09-25 MED ORDER — HEPARIN (PORCINE) IN NACL 1000-0.9 UT/500ML-% IV SOLN
INTRAVENOUS | Status: DC | PRN
  Administered 2018-09-25: 500 mL

## 2018-09-25 MED ORDER — SODIUM CHLORIDE 0.9 % WEIGHT BASED INFUSION
3.0000 mL/kg/h | INTRAVENOUS | Status: DC
  Administered 2018-09-25: 3 mL/kg/h via INTRAVENOUS

## 2018-09-25 MED ORDER — LIDOCAINE HCL (PF) 1 % IJ SOLN
INTRAMUSCULAR | Status: DC | PRN
  Administered 2018-09-25: 2 mL

## 2018-09-25 MED ORDER — SODIUM CHLORIDE 0.9% FLUSH: 3.0000 mL | Freq: Two times a day (BID) | INTRAVENOUS | Status: DC

## 2018-09-25 MED ORDER — AMLODIPINE BESYLATE 10 MG PO TABS
10.0000 mg | ORAL_TABLET | Freq: Every day | ORAL | Status: DC
  Administered 2018-09-25 – 2018-09-26 (×2): 10 mg via ORAL
  Filled 2018-09-25 (×2): qty 1

## 2018-09-25 MED ORDER — TICAGRELOR 90 MG PO TABS
90.0000 mg | ORAL_TABLET | Freq: Two times a day (BID) | ORAL | Status: DC
  Administered 2018-09-25 – 2018-09-26 (×2): 90 mg via ORAL
  Filled 2018-09-25 (×2): qty 1

## 2018-09-25 MED ORDER — SODIUM CHLORIDE 0.9 % IV SOLN: 250.0000 mL | INTRAVENOUS | Status: DC | PRN

## 2018-09-25 MED ORDER — TIROFIBAN (AGGRASTAT) BOLUS VIA INFUSION
INTRAVENOUS | Status: DC | PRN
  Administered 2018-09-25: 2207.5 ug via INTRAVENOUS

## 2018-09-25 MED ORDER — MIDAZOLAM HCL 2 MG/2ML IJ SOLN
INTRAMUSCULAR | Status: AC
  Filled 2018-09-25: qty 2

## 2018-09-25 MED ORDER — HEPARIN SODIUM (PORCINE) 1000 UNIT/ML IJ SOLN
INTRAMUSCULAR | Status: DC | PRN
  Administered 2018-09-25: 4500 [IU] via INTRAVENOUS
  Administered 2018-09-25: 6500 [IU] via INTRAVENOUS

## 2018-09-25 MED ORDER — MIDAZOLAM HCL 2 MG/2ML IJ SOLN
INTRAMUSCULAR | Status: DC | PRN
  Administered 2018-09-25: 2 mg via INTRAVENOUS

## 2018-09-25 MED ORDER — SODIUM CHLORIDE 0.9 % IV SOLN
INTRAVENOUS | Status: AC | PRN
  Administered 2018-09-25: 10 mL/h via INTRAVENOUS

## 2018-09-25 MED ORDER — ASPIRIN 81 MG PO CHEW
81.0000 mg | CHEWABLE_TABLET | Freq: Every day | ORAL | Status: DC
  Administered 2018-09-26: 09:00:00 81 mg via ORAL
  Filled 2018-09-25: qty 1

## 2018-09-25 MED ORDER — ACETAMINOPHEN 325 MG PO TABS: 650.0000 mg | ORAL_TABLET | ORAL | Status: DC | PRN

## 2018-09-25 MED ORDER — FENTANYL CITRATE (PF) 100 MCG/2ML IJ SOLN
INTRAMUSCULAR | Status: DC | PRN
  Administered 2018-09-25: 25 ug via INTRAVENOUS

## 2018-09-25 MED ORDER — LIDOCAINE HCL (PF) 1 % IJ SOLN
INTRAMUSCULAR | Status: AC
  Filled 2018-09-25: qty 30

## 2018-09-25 MED ORDER — HEART ATTACK BOUNCING BOOK
Freq: Once | Status: AC
  Administered 2018-09-25: 1
  Filled 2018-09-25: qty 1

## 2018-09-25 MED ORDER — LABETALOL HCL 5 MG/ML IV SOLN: 10.0000 mg | INTRAVENOUS | Status: AC | PRN

## 2018-09-25 MED ORDER — TICAGRELOR 90 MG PO TABS
ORAL_TABLET | ORAL | Status: AC
  Filled 2018-09-25: qty 2

## 2018-09-25 MED ORDER — IOHEXOL 350 MG/ML SOLN
INTRAVENOUS | Status: DC | PRN
  Administered 2018-09-25: 115 mL via INTRA_ARTERIAL

## 2018-09-25 MED ORDER — TIROFIBAN HCL IN NACL 5-0.9 MG/100ML-% IV SOLN
INTRAVENOUS | Status: DC | PRN
  Administered 2018-09-25: 0.15 ug/kg/min via INTRAVENOUS

## 2018-09-25 MED ORDER — ONDANSETRON HCL 4 MG/2ML IJ SOLN: 4.0000 mg | Freq: Four times a day (QID) | INTRAMUSCULAR | Status: DC | PRN

## 2018-09-25 MED ORDER — VERAPAMIL HCL 2.5 MG/ML IV SOLN
INTRAVENOUS | Status: DC | PRN
  Administered 2018-09-25 (×2): 10 mL via INTRA_ARTERIAL

## 2018-09-25 MED ORDER — HEPARIN (PORCINE) IN NACL 1000-0.9 UT/500ML-% IV SOLN
INTRAVENOUS | Status: AC
  Filled 2018-09-25: qty 1000

## 2018-09-25 MED ORDER — HYDRALAZINE HCL 20 MG/ML IJ SOLN: 5.0000 mg | INTRAMUSCULAR | Status: AC | PRN

## 2018-09-25 MED ORDER — SODIUM CHLORIDE 0.9 % IV SOLN: 500.0000 mL | INTRAVENOUS | Status: DC

## 2018-09-25 MED ORDER — SODIUM CHLORIDE 0.9% FLUSH
3.0000 mL | Freq: Two times a day (BID) | INTRAVENOUS | Status: DC
  Administered 2018-09-25 (×2): 3 mL via INTRAVENOUS

## 2018-09-25 MED ORDER — ANGIOPLASTY BOOK
Freq: Once | Status: AC
  Administered 2018-09-25: 22:00:00 1
  Filled 2018-09-25: qty 1

## 2018-09-25 MED ORDER — VERAPAMIL HCL 2.5 MG/ML IV SOLN
INTRAVENOUS | Status: AC
  Filled 2018-09-25: qty 2

## 2018-09-25 MED ORDER — SODIUM CHLORIDE 0.9 % WEIGHT BASED INFUSION: 1.0000 mL/kg/h | INTRAVENOUS | Status: DC

## 2018-09-25 MED ORDER — VERAPAMIL HCL 2.5 MG/ML IV SOLN
INTRAVENOUS | Status: DC | PRN
  Administered 2018-09-25 (×2): 200 ug via INTRACORONARY

## 2018-09-25 MED ORDER — FENTANYL CITRATE (PF) 100 MCG/2ML IJ SOLN
INTRAMUSCULAR | Status: AC
  Filled 2018-09-25: qty 2

## 2018-09-25 MED ORDER — TICAGRELOR 90 MG PO TABS
ORAL_TABLET | ORAL | Status: DC | PRN
  Administered 2018-09-25: 180 mg via ORAL

## 2018-09-25 SURGICAL SUPPLY — 17 items
BALLN SAPPHIRE 2.0X15 (BALLOONS) ×2
BALLN SAPPHIRE ~~LOC~~ 3.75X12 (BALLOONS) ×2 IMPLANT
BALLOON SAPPHIRE 2.0X15 (BALLOONS) ×1 IMPLANT
CATH 5FR JL3.5 JR4 ANG PIG MP (CATHETERS) ×2 IMPLANT
CATH VISTA GUIDE 6FR XBRCA (CATHETERS) ×2 IMPLANT
DEVICE RAD COMP TR BAND LRG (VASCULAR PRODUCTS) ×2 IMPLANT
GLIDESHEATH SLEND SS 6F .021 (SHEATH) ×2 IMPLANT
GUIDEWIRE INQWIRE 1.5J.035X260 (WIRE) ×1 IMPLANT
INQWIRE 1.5J .035X260CM (WIRE) ×2
KIT ENCORE 26 ADVANTAGE (KITS) ×2 IMPLANT
KIT HEART LEFT (KITS) ×2 IMPLANT
KIT HEMO VALVE WATCHDOG (MISCELLANEOUS) ×2 IMPLANT
PACK CARDIAC CATHETERIZATION (CUSTOM PROCEDURE TRAY) ×2 IMPLANT
STENT SYNERGY DES 3.5X20 (Permanent Stent) ×2 IMPLANT
TRANSDUCER W/STOPCOCK (MISCELLANEOUS) ×2 IMPLANT
TUBING CIL FLEX 10 FLL-RA (TUBING) ×2 IMPLANT
WIRE SION BLUE 180 (WIRE) ×2 IMPLANT

## 2018-09-25 NOTE — Progress Notes (Signed)
Dr Hassell Done returned the call earlier and verbal orders received but text paged again re: patient having more frequent PVC's and some pauses, HR 55-60 with B/P WNL, O2 placed at 2L N/C for comfort, awaiting further orders.

## 2018-09-25 NOTE — Care Management (Signed)
#    5.   S/W KAY  @ [facility] [employer]  #           [phone]   BRILINTA  90  MG BID COVER- YES CO-PAY-  $ 45.00 TIER- 3 DRUG PRIOR APPROVAL- NO NO DEDUCTIBLE  PREFERRED PHARMACY : YES [facility] AND  [facility]

## 2018-09-25 NOTE — Progress Notes (Addendum)
Progress Note  Patient Name: Edward Freeman Date of Encounter: 09/25/2018  Primary Cardiologist: Quay Burow, MD  Subjective   Feeling significantly better this AM. No further pain on IV NTG. Rec'd metoprolol 12.5mg  last night with intermittent bradycardia on telemetry but asymptomatic (HR 40-55).  Inpatient Medications    Scheduled Meds: . aspirin EC  81 mg Oral Daily  . atorvastatin  80 mg Oral q1800   Continuous Infusions: . sodium chloride 10 mL/hr at 09/24/18 1440  . heparin 1,200 Units/hr (09/25/18 0722)  . nitroGLYCERIN 20 mcg/min (09/25/18 0721)   PRN Meds: sodium chloride, acetaminophen, morphine injection, nitroGLYCERIN, ondansetron (ZOFRAN) IV   Vital Signs    Vitals:   09/25/18 0535 09/25/18 0550 09/25/18 0605 09/25/18 0743  BP: 125/73 118/69 117/73 116/72  Pulse:   (!) 52 (!) 50  Resp: 19 20 17  (!) 21  Temp:   98.9 F (37.2 C) (!) 97.5 F (36.4 C)  TempSrc:   Oral Oral  SpO2:   99% 99%  Weight:   88.3 kg   Height:        Intake/Output Summary (Last 24 hours) at 09/25/2018 0758 Last data filed at 09/25/2018 0600 Gross per 24 hour  Intake 353.64 ml  Output -  Net 353.64 ml   Filed Weights   09/24/18 1240 09/25/18 0605  Weight: 99.8 kg 88.3 kg    Telemetry    NSR/SB 40-55, 5 beat run of NSVT - Personally Reviewed  ECG    NSR with subtle inferior ST upsloping, TWI in aVL. Anterolateral lead changes appear improved from prior - Personally Reviewed  Physical Exam   GEN: No acute distress. Comfortable appearing. HEENT: Normocephalic, atraumatic, sclera non-icteric. Neck: No JVD or bruits. Cardiac: RRR no murmurs, rubs, or gallops.  Radials/DP/PT 1+ and equal bilaterally.  Respiratory: Clear to auscultation bilaterally. Breathing is unlabored. GI: Soft, nontender, non-distended, BS +x 4. MS: no deformity. Extremities: No clubbing or cyanosis. No edema. Distal pedal pulses are 2+ and equal bilaterally. Neuro:  AAOx3. Follows  commands. Psych:  Responds to questions appropriately with a normal affect.  Labs    Chemistry Recent Labs  Lab 09/24/18 1239 09/25/18 0127 09/25/18 0253  NA 141 132* 140  K 2.9* 6.0* 4.1  CL 107 106 112*  CO2 24 22 20*  GLUCOSE 137* 151* 132*  BUN 13 12 12   CREATININE 0.96 0.85 0.96  CALCIUM 8.9 8.0* 8.5*  GFRNONAA >60 >60 >60  GFRAA >60 >60 >60  ANIONGAP 10 4* 8     Hematology Recent Labs  Lab 09/24/18 1239  WBC 7.1  RBC 5.20  HGB 15.3  HCT 46.0  MCV 88.5  MCH 29.4  MCHC 33.3  RDW 13.2  PLT 211    Cardiac Enzymes Recent Labs  Lab 09/24/18 1239 09/24/18 1541 09/24/18 1813 09/24/18 2108  TROPONINI <0.03 0.05* 0.19* 0.83*   No results for input(s): TROPIPOC in the last 168 hours.   Radiology    Dg Chest 2 View  Result Date: 09/24/2018 CLINICAL DATA:  Chest pain, weakness, diaphoresis EXAM: CHEST - 2 VIEW COMPARISON:  01/14/2010 FINDINGS: Lungs are clear. No pleural effusion or pneumothorax. The heart is normal in size. Visualized osseous structures are within normal limits. IMPRESSION: Normal chest radiographs. Electronically Signed   By: Julian Hy M.D.   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 Was previously evaluated in 11/2017 for atypical CP and palpitations with normal nuc; echo with EF 55-60% with mild AI/MR/TR. Event monitor earlier this year showed PSVT -> was referred to Dr. Curt Bears for eval but appt did not occur.  Assessment & Plan    1. NSTEMI - continue heparin, ASA, statin, NTG gtt. Hold BB given bradycardia. Already had labs this AM so will f/u troponin level in AM to confirm downtrend as peak is 0.83 thus far. Plan cath today. Risks and benefits of cardiac catheterization have been discussed with the patient.  These include bleeding, infection, kidney damage,  stroke, heart attack, death.  The patient understands these risks and is willing to proceed. EKG shows subtle inferior ST upsloping. Anterolateral leads appear better but would prefer to expedite cath. Dr. Loletha Grayer spoke to lab to help facilitate. I also updated nurse.  2. Sinus bradycardia - HR in the 40s overnight; 40-50 this AM before any meds. With inferior ST changes, question RCA lesion. Would hold further BB for now.  3. Hypokalemia - repleted; subsequent value hemolyzed then repeat normal. F/u BMET in AM.  4. Brief run of ventricular ectopy, also with h/o PSVT - K OK. Add Mg level. HR too low to continue BB for now. Follow. Can reconsider EP eval as OP to consider ablation. He did not follow-up with them because those symptoms had not recurred.  5. HTN - controlled this AM on IV NTG but mildly elevated on admission. Follow hospital course and consider resumption of prior amlodipine (listed as not taking on admission).  6. Hyperlipidemia - started on statin for LDL 128. Add baseline LFTs in AM. If the patient is tolerating statin at time of follow-up appointment, would consider rechecking liver function/lipid panel in 6-8 weeks.  For questions or updates, please contact [place] Please consult [email or web address] for contact info under Cardiology/STEMI.  Signed, Charlie Pitter, PA-C 09/25/2018, 7:58 AM    I have seen and examined the patient along with Charlie Pitter, PA-C.  I have reviewed the chart, notes and new data.  I agree with PA's note.  Key new complaints: no angina at rest on IV NTG Key examination changes: no acute HF Key new findings / data: no obvious wall motion abnormalities on bedside review of echo. ECG shows improving lateral wave T wave inversion, but also subtle inferior ST elevation. Elevated troponin.  PLAN: High suspicion for unstable coronary lesion, probably in left circumflex distribution. Inferior ST changes almost look like pericarditis, but likely secondary to  ischemia. For cardiac cath today. This procedure has been fully reviewed with the patient and written informed consent has been obtained.   Sanda Klein, MD, [place] [phone] 09/25/2018, 9:55 AM

## 2018-09-25 NOTE — Progress Notes (Signed)
  Echocardiogram 2D Echocardiogram has been performed.  Edward Freeman 09/25/2018, 9:52 AM

## 2018-09-25 NOTE — Progress Notes (Signed)
BP noted to be creeping up. Will resume home amlodipine. Nerida Boivin PA-C

## 2018-09-25 NOTE — H&P (View-Only) (Signed)
Progress Note  Patient Name: Edward Freeman Date of Encounter: 09/25/2018  Primary Cardiologist: Quay Burow, MD  Subjective   Feeling significantly better this AM. No further pain on IV NTG. Rec'd metoprolol 12.5mg  last night with intermittent bradycardia on telemetry but asymptomatic (HR 40-55).  Inpatient Medications    Scheduled Meds: . aspirin EC  81 mg Oral Daily  . atorvastatin  80 mg Oral q1800   Continuous Infusions: . sodium chloride 10 mL/hr at 09/24/18 1440  . heparin 1,200 Units/hr (09/25/18 0722)  . nitroGLYCERIN 20 mcg/min (09/25/18 0721)   PRN Meds: sodium chloride, acetaminophen, morphine injection, nitroGLYCERIN, ondansetron (ZOFRAN) IV   Vital Signs    Vitals:   09/25/18 0535 09/25/18 0550 09/25/18 0605 09/25/18 0743  BP: 125/73 118/69 117/73 116/72  Pulse:   (!) 52 (!) 50  Resp: 19 20 17  (!) 21  Temp:   98.9 F (37.2 C) (!) 97.5 F (36.4 C)  TempSrc:   Oral Oral  SpO2:   99% 99%  Weight:   88.3 kg   Height:        Intake/Output Summary (Last 24 hours) at 09/25/2018 0758 Last data filed at 09/25/2018 0600 Gross per 24 hour  Intake 353.64 ml  Output -  Net 353.64 ml   Filed Weights   09/24/18 1240 09/25/18 0605  Weight: 99.8 kg 88.3 kg    Telemetry    NSR/SB 40-55, 5 beat run of NSVT - Personally Reviewed  ECG    NSR with subtle inferior ST upsloping, TWI in aVL. Anterolateral lead changes appear improved from prior - Personally Reviewed  Physical Exam   GEN: No acute distress. Comfortable appearing. HEENT: Normocephalic, atraumatic, sclera non-icteric. Neck: No JVD or bruits. Cardiac: RRR no murmurs, rubs, or gallops.  Radials/DP/PT 1+ and equal bilaterally.  Respiratory: Clear to auscultation bilaterally. Breathing is unlabored. GI: Soft, nontender, non-distended, BS +x 4. MS: no deformity. Extremities: No clubbing or cyanosis. No edema. Distal pedal pulses are 2+ and equal bilaterally. Neuro:  AAOx3. Follows  commands. Psych:  Responds to questions appropriately with a normal affect.  Labs    Chemistry Recent Labs  Lab 09/24/18 1239 09/25/18 0127 09/25/18 0253  NA 141 132* 140  K 2.9* 6.0* 4.1  CL 107 106 112*  CO2 24 22 20*  GLUCOSE 137* 151* 132*  BUN 13 12 12   CREATININE 0.96 0.85 0.96  CALCIUM 8.9 8.0* 8.5*  GFRNONAA >60 >60 >60  GFRAA >60 >60 >60  ANIONGAP 10 4* 8     Hematology Recent Labs  Lab 09/24/18 1239  WBC 7.1  RBC 5.20  HGB 15.3  HCT 46.0  MCV 88.5  MCH 29.4  MCHC 33.3  RDW 13.2  PLT 211    Cardiac Enzymes Recent Labs  Lab 09/24/18 1239 09/24/18 1541 09/24/18 1813 09/24/18 2108  TROPONINI <0.03 0.05* 0.19* 0.83*   No results for input(s): TROPIPOC in the last 168 hours.   Radiology    Dg Chest 2 View  Result Date: 09/24/2018 CLINICAL DATA:  Chest pain, weakness, diaphoresis EXAM: CHEST - 2 VIEW COMPARISON:  01/14/2010 FINDINGS: Lungs are clear. No pleural effusion or pneumothorax. The heart is normal in size. Visualized osseous structures are within normal limits. IMPRESSION: Normal chest radiographs. Electronically Signed   By: Julian Hy M.D.   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 Was previously evaluated in 11/2017 for atypical CP and palpitations with normal nuc; echo with EF 55-60% with mild AI/MR/TR. Event monitor earlier this year showed PSVT -> was referred to Dr. Curt Bears for eval but appt did not occur.  Assessment & Plan    1. NSTEMI - continue heparin, ASA, statin, NTG gtt. Hold BB given bradycardia. Already had labs this AM so will f/u troponin level in AM to confirm downtrend as peak is 0.83 thus far. Plan cath today. Risks and benefits of cardiac catheterization have been discussed with the patient.  These include bleeding, infection, kidney damage,  stroke, heart attack, death.  The patient understands these risks and is willing to proceed. EKG shows subtle inferior ST upsloping. Anterolateral leads appear better but would prefer to expedite cath. Dr. Loletha Grayer spoke to lab to help facilitate. I also updated nurse.  2. Sinus bradycardia - HR in the 40s overnight; 40-50 this AM before any meds. With inferior ST changes, question RCA lesion. Would hold further BB for now.  3. Hypokalemia - repleted; subsequent value hemolyzed then repeat normal. F/u BMET in AM.  4. Brief run of ventricular ectopy, also with h/o PSVT - K OK. Add Mg level. HR too low to continue BB for now. Follow. Can reconsider EP eval as OP to consider ablation. He did not follow-up with them because those symptoms had not recurred.  5. HTN - controlled this AM on IV NTG but mildly elevated on admission. Follow hospital course and consider resumption of prior amlodipine (listed as not taking on admission).  6. Hyperlipidemia - started on statin for LDL 128. Add baseline LFTs in AM. If the patient is tolerating statin at time of follow-up appointment, would consider rechecking liver function/lipid panel in 6-8 weeks.  For questions or updates, please contact [place] Please consult [email or web address] for contact info under Cardiology/STEMI.  Signed, Charlie Pitter, PA-C 09/25/2018, 7:58 AM    I have seen and examined the patient along with Charlie Pitter, PA-C.  I have reviewed the chart, notes and new data.  I agree with PA's note.  Key new complaints: no angina at rest on IV NTG Key examination changes: no acute HF Key new findings / data: no obvious wall motion abnormalities on bedside review of echo. ECG shows improving lateral wave T wave inversion, but also subtle inferior ST elevation. Elevated troponin.  PLAN: High suspicion for unstable coronary lesion, probably in left circumflex distribution. Inferior ST changes almost look like pericarditis, but likely secondary to  ischemia. For cardiac cath today. This procedure has been fully reviewed with the patient and written informed consent has been obtained.   Sanda Klein, MD, [place] [phone] 09/25/2018, 9:55 AM

## 2018-09-25 NOTE — Progress Notes (Signed)
ANTICOAGULATION CONSULT NOTE - Follow Up Consult  Pharmacy Consult for Heparin Indication: chest pain/ACS/NSTEMI  No Known Allergies  Patient Measurements: Height: 5\' 11"  (180.3 cm) Weight: 194 lb 11.2 oz (88.3 kg) IBW/kg (Calculated) : 75.3 Heparin Dosing Weight: 88 kg  Vital Signs: Temp: 97.5 F (36.4 C) (11/18 0743) Temp Source: Oral (11/18 0743) BP: 116/72 (11/18 0743) Pulse Rate: 50 (11/18 0743)  Labs: Recent Labs    09/24/18 1239  09/24/18 1813 09/24/18 2108 09/25/18 0127 09/25/18 0253 09/25/18 0732  HGB 15.3  --   --   --   --   --  13.6  HCT 46.0  --   --   --   --   --  42.7  PLT 211  --   --   --   --   --  214  LABPROT  --   --   --   --   --  14.5  --   INR  --   --   --   --   --  1.14  --   HEPARINUNFRC  --   --   --  0.92*  --   --  0.94*  CREATININE 0.96  --   --   --  0.85 0.96  --   TROPONINI <0.03   < > 0.19* 0.83*  --   --  12.75*   < > = values in this interval not displayed.    Estimated Creatinine Clearance: 79.5 mL/min (by C-G formula based on SCr of 0.96 mg/dL).  Assessment:  67 year old male continues on IV heparin for CP/STEMI.       Heparin level remains supratherapeutic (0.94) on 1200 units/hr.  On IV Nitroglycerin. Planning cardiac cath today, expecting next case.  Goal of Therapy:  Heparin level 0.3-0.7 units/ml Monitor platelets by anticoagulation protocol: Yes   Plan:   Decrease heparin drip to 1000 units/hr  Follow up post-cath.   Arty Baumgartner, [place] Pager: [phone] or phone: [phone] 09/25/2018,10:14 AM

## 2018-09-25 NOTE — Progress Notes (Signed)
Text paged Cardiology re: abnormal troponins and chest pain not relieved by maximum NTG drip ordered, awaiting call back.

## 2018-09-25 NOTE — Interval H&P Note (Signed)
Cath Lab Visit (complete for each Cath Lab visit)  Clinical Evaluation Leading to the Procedure:   ACS: Yes.    Non-ACS:    Anginal Classification: CCS IV  Anti-ischemic medical therapy: Minimal Therapy (1 class of medications)  Non-Invasive Test Results: No non-invasive testing performed  Prior CABG: No previous CABG      History and Physical Interval Note:  09/25/2018 10:49 AM  Edward Freeman  has presented today for surgery, with the diagnosis of cp  The various methods of treatment have been discussed with the patient and family. After consideration of risks, benefits and other options for treatment, the patient has consented to  Procedure(s): LEFT HEART CATH AND CORONARY ANGIOGRAPHY (N/A) as a surgical intervention .  The patient's history has been reviewed, patient examined, no change in status, stable for surgery.  I have reviewed the patient's chart and labs.  Questions were answered to the patient's satisfaction.     Larae Grooms

## 2018-09-25 NOTE — Discharge Summary (Addendum)
Discharge Summary    Patient ID: TRIPP GOINS MRN: [id number]; DOB: 04-14-1951  Admit date: 09/24/2018 Discharge date: 09/26/2018  Primary Care Provider: Dorothyann Peng, NP  Primary Cardiologist: Quay Burow, MD  Primary Electrophysiologist:  None   Discharge Diagnoses    Principal Problem:   Non-ST elevation (NSTEMI) myocardial infarction [facility]) Active Problems:   Essential hypertension   Hyperlipidemia   Unstable angina (HCC)   Chest pain   History of PSVT (paroxysmal supraventricular tachycardia)   Sinus bradycardia   CAD in native artery  Allergies No Known Allergies  Diagnostic Studies/Procedures    LHC 09/24/18 Conclusion     Mid RCA lesion is 100% stenosed. Left to right collaterals present.  A drug-eluting stent was successfully placed using a STENT SYNERGY DES 3.5X20.  Post intervention, there is a 0% residual stenosis.  The left ventricular systolic function is normal.  LV end diastolic pressure is normal.  The left ventricular ejection fraction is 55-65% by visual estimate.  There is no aortic valve stenosis.     Recommend uninterrupted dual antiplatelet therapy with Aspirin 81mg  daily and Ticagrelor 90mg  twice daily for a minimum of 12 months (ACS - Class I recommendation).   Continue aggressive secondary prevention.    2D echo 09/24/18 Study Conclusions  - Left ventricle: The cavity size was normal. Wall thickness was   normal. Systolic function was normal. The estimated ejection   fraction was in the range of 55% to 60%. Basal inferior   hypokinesis. Doppler parameters are consistent with abnormal left   ventricular relaxation (grade 1 diastolic dysfunction). The E/e&'   ratio is <8, suggesting normal LV filling pressure. - Mitral valve: Mildly thickened leaflets . There was trivial   regurgitation. - Left atrium: The atrium was normal in size.  Impressions:  - Compared to a prior study in 11/2017, the LVEF is unchanged at   55-60%, with possible basal inferior hypokinesis and grade 1 DD   with normal LV filling pressure.   _____________   History of Present Illness     [name] is a 67 y.o. male with PSVT, GERD, HTN (not on meds), HLD (by labs this admission) who presented to [facility] with CP, SOB, diaphoresis and nausea, and was found to have NSTEMI.   He was previously evaluated in 11/2017 for atypical CP and palpitations with normal nuc and echo with EF 55-60% with mild AI/MR/TR. Event monitor earlier this year showed PSVT. He was referred to Dr. Curt Bears for consideration of ablation but did not attend this appointment as symptoms had resolved. He was at rest the day of admission and developed epigastric/midsternal "burning" pain that had random/sudden onset, accompanied by diaphoresis, SOB, nausea and radiation to L arm. It lasted 30-45 min and so he decided to come to ED for further evaluation. NTG helped but did not totally alleviate the pain so he was received IV morphine and was placed on IV NTG. He denied any recent CP prior to this episode but had noticed DOE that had worsened over the past several months. He was admitted for further evaluation.  Hospital Course     1. NSTEMI - he was started on heparin, ASA, statin, and continued on NTG gtt. He was started on low dose metoprolol initially but became bradycardic into the 40s so this was discontinued. Initial EKG showed NSR with lateral TWI, more prominent than when compared to 2017. First troponin was negative but subsequent values 0.05->0.19->0.83->12.75->14.92. He did remain pain free overnight  after admission. Due to subtle inferior ST upsloping the next morning, cardiac cath was expedited demonstrating 100% mRCA with L-R collaterals, treated with drug-eluting stent, LVEF was 55-65%. LVEDP was normal. He was started on Brilinta with recommendation to continue uninterrupted dual antiplatelet therapy with Aspirin 81mg  daily and Ticagrelor 90mg  twice  daily for a minimum of 12 months (ACS - Class I recommendation). 2D Echo showed EF 55-60%, basal inferior hypokinesis, grade 1 DD, trivial MR.   2. Sinus bradycardia - TSH wnl. Beta blocker initially started then discontinued due to this. HR stable after discontinuation but will remain off BB.  3. Hypokalemia - repleted; subsequent value hemolyzed then repeat normal.   4. Brief run of ventricular ectopy (5 beats), also with h/o PSVT - no further episodes on tele. No PSVT seen this admission. Can reconsider EP eval as OP to consider ablation. He did not follow-up with them because those symptoms had not recurred.  5. HTN - amlodipine resumed yesterday after IV NTG was discontinued. Can continue to follow as OP.  6. Hyperlipidemia - started on statin for LDL 128. AST 107, suspected due to MI. Can consider rechecking in follow-up to make sure stable. If the patient is tolerating statin at time of follow-up appointment, would consider rechecking liver function/lipid panel in 6-8 weeks.  The patient did well overnight post-PCI. He does have some swelling of his right forearm but has good pulse and cap refill. Standard precautions reviewed. He was asked to remain out of work until cleared at time of follow-up. Dr. Sallyanne Kuster feels he can return after that visit if doing well. Dr. Sallyanne Kuster has seen and examined the patient today and feels he is stable for discharge. Meds were procured through the [facility] who will then transfer prescriptions to his usual pharmacy. _____________  Discharge Vitals Blood pressure 134/72, pulse 81, temperature 98.2 F (36.8 C), temperature source Oral, resp. rate 18, height 5\' 11"  (1.803 m), weight 89 kg, SpO2 98 %.  Filed Weights   09/24/18 1240 09/25/18 0605 09/26/18 0124  Weight: 99.8 kg 88.3 kg 89 kg    Labs & Radiologic Studies    CBC Recent Labs    09/25/18 0732 09/26/18 0338  WBC 9.7 8.6  HGB 13.6 15.4  HCT 42.7 48.2  MCV 88.0 87.0  PLT 214 798    Basic Metabolic Panel Recent Labs    09/25/18 0253 09/25/18 0732 09/26/18 0338  NA 140  --  142  K 4.1  --  3.8  CL 112*  --  109  CO2 20*  --  24  GLUCOSE 132*  --  105*  BUN 12  --  10  CREATININE 0.96  --  1.00  CALCIUM 8.5*  --  9.0  MG  --  2.0 2.1   Liver Function Tests Recent Labs    09/26/18 0338  AST 107*  ALT 38  ALKPHOS 56  BILITOT 1.6*  PROT 7.1  ALBUMIN 3.7    Cardiac Enzymes Recent Labs    09/24/18 2108 09/25/18 0732 09/26/18 0338  TROPONINI 0.83* 12.75* 14.92*   BNP Invalid input(s): POCBNP D-Dimer No results for input(s): DDIMER in the last 72 hours. Hemoglobin A1C Recent Labs    09/25/18 0253  HGBA1C 5.6   Fasting Lipid Panel Recent Labs    09/25/18 0253  CHOL 195  HDL 58  LDLCALC 128*  TRIG 43  CHOLHDL 3.4   Thyroid Function Tests Recent Labs    09/24/18 2108  TSH 0.642  _____________  Dg Chest 2 View  Result Date: 09/24/2018 CLINICAL DATA:  Chest pain, weakness, diaphoresis EXAM: CHEST - 2 VIEW COMPARISON:  01/14/2010 FINDINGS: Lungs are clear. No pleural effusion or pneumothorax. The heart is normal in size. Visualized osseous structures are within normal limits. IMPRESSION: Normal chest radiographs. Electronically Signed   By: Julian Hy M.D.   On: 09/24/2018 12:58   Disposition   Pt is being discharged home today in good condition.  Follow-up Plans & Appointments    Follow-up Information    Erlene Quan, PA-C Follow up.   Specialties:  Cardiology, Radiology Why:  [facility] location - 10/04/18 at 8:30am. Please arrive 15 minutes early to check in. Kerin Ransom is one of the PAs with Dr. Gwenlyn Found and his care team. Contact information: [address] [place] [address] [phone]          Discharge Instructions    AMB Referral to Cardiac Rehabilitation - Phase II   Complete by:  As directed    Diagnosis:   NSTEMI Coronary Stents     Amb Referral to Cardiac  Rehabilitation   Complete by:  As directed    Diagnosis:   Coronary Stents NSTEMI     Diet - low sodium heart healthy   Complete by:  As directed    Discharge instructions   Complete by:  As directed    Percocet, tadalafil (Cialis), and diazepam (Valium) were removed from your medicine list since you indicated you were no longer taking these. In the future you should be aware that you should not take any Cialis/tadalafil within 48 hours of taking nitroglycerin, and vice versa.  Please monitor your blood pressure occasionally at home. Call your doctor if you tend to get readings of greater than 130 on the top number or 80 on the bottom number.   Increase activity slowly   Complete by:  As directed    No driving for 1 week. No lifting over 10 lbs for 2 weeks. No sexual activity for 2 weeks. You may not return to work until seen in follow-up. If you are feeling well at that time you will likely be cleared to return. Keep procedure site clean & dry. If you notice increased pain, swelling, bleeding or pus, call/return!  You may shower, but no soaking baths/hot tubs/pools for 1 week.      Discharge Medications   Allergies as of 09/26/2018   No Known Allergies     Medication List    STOP taking these medications   diazepam 5 MG tablet Commonly known as:  VALIUM   oxyCODONE-acetaminophen 5-325 MG tablet Commonly known as:  PERCOCET/ROXICET   tadalafil 20 MG tablet Commonly known as:  ADCIRCA/CIALIS     TAKE these medications   amLODipine 10 MG tablet Commonly known as:  NORVASC TAKE 1 TABLET (10 MG TOTAL) BY MOUTH DAILY.   aspirin 81 MG tablet Take 1 tablet (81 mg total) by mouth daily. What changed:  when to take this   atorvastatin 80 MG tablet Commonly known as:  LIPITOR Take 1 tablet (80 mg total) by mouth every evening.   finasteride 5 MG tablet Commonly known as:  PROSCAR Take 1 tablet (5 mg total) by mouth daily.   nitroGLYCERIN 0.4 MG SL tablet Commonly known  as:  NITROSTAT Place 1 tablet (0.4 mg total) under the tongue every 5 (five) minutes as needed for chest pain (up to 3 doses. If taking 3rd dose  call 911).   ticagrelor 90 MG Tabs tablet Commonly known as:  BRILINTA Take 1 tablet (90 mg total) by mouth 2 (two) times daily.        Acute coronary syndrome (MI, NSTEMI, STEMI, etc) this admission?: Yes.     AHA/ACC Clinical Performance & Quality Measures: 1. Aspirin prescribed? - Yes 2. ADP Receptor Inhibitor (Plavix/Clopidogrel, Brilinta/Ticagrelor or Effient/Prasugrel) prescribed (includes medically managed patients)? - Yes 3. Beta Blocker prescribed? - No - bradycardia 4. High Intensity Statin (Lipitor 40-80mg  or Crestor 20-40mg ) prescribed? - Yes 5. EF assessed during THIS hospitalization? - Yes 6. For EF <40%, was ACEI/ARB prescribed? - Not Applicable (EF >/= 76%) 7. For EF <40%, Aldosterone Antagonist (Spironolactone or Eplerenone) prescribed? - Not Applicable (EF >/= 80%) 8. Cardiac Rehab Phase II ordered (Included Medically managed Patients)? - Yes     Outstanding Labs/Studies   See above  Duration of Discharge Encounter   Greater than 30 minutes including physician time.  Signed, Charlie Pitter, PA-C 09/26/2018, 9:24 AM

## 2018-09-26 ENCOUNTER — Telehealth: Payer: Self-pay | Admitting: *Deleted

## 2018-09-26 ENCOUNTER — Encounter: Payer: Self-pay | Admitting: *Deleted

## 2018-09-26 ENCOUNTER — Encounter (HOSPITAL_COMMUNITY): Payer: Self-pay | Admitting: Interventional Cardiology

## 2018-09-26 LAB — HEPATIC FUNCTION PANEL
ALBUMIN: 3.7 g/dL (ref 3.5–5.0)
ALK PHOS: 56 U/L (ref 38–126)
ALT: 38 U/L (ref 0–44)
AST: 107 U/L — AB (ref 15–41)
Bilirubin, Direct: 0.2 mg/dL (ref 0.0–0.2)
Indirect Bilirubin: 1.4 mg/dL — ABNORMAL HIGH (ref 0.3–0.9)
Total Bilirubin: 1.6 mg/dL — ABNORMAL HIGH (ref 0.3–1.2)
Total Protein: 7.1 g/dL (ref 6.5–8.1)

## 2018-09-26 LAB — CBC
HCT: 48.2 % (ref 39.0–52.0)
HEMOGLOBIN: 15.4 g/dL (ref 13.0–17.0)
MCH: 27.8 pg (ref 26.0–34.0)
MCHC: 32 g/dL (ref 30.0–36.0)
MCV: 87 fL (ref 80.0–100.0)
Platelets: 227 10*3/uL (ref 150–400)
RBC: 5.54 MIL/uL (ref 4.22–5.81)
RDW: 13.3 % (ref 11.5–15.5)
WBC: 8.6 10*3/uL (ref 4.0–10.5)
nRBC: 0 % (ref 0.0–0.2)

## 2018-09-26 LAB — BASIC METABOLIC PANEL
Anion gap: 9 (ref 5–15)
BUN: 10 mg/dL (ref 8–23)
CO2: 24 mmol/L (ref 22–32)
Calcium: 9 mg/dL (ref 8.9–10.3)
Chloride: 109 mmol/L (ref 98–111)
Creatinine, Ser: 1 mg/dL (ref 0.61–1.24)
GFR calc Af Amer: >60 mL/min (ref >60)
GFR calc non Af Amer: >60 mL/min (ref >60)
GLUCOSE: 105 mg/dL — AB (ref 70–99)
POTASSIUM: 3.8 mmol/L (ref 3.5–5.1)
Sodium: 142 mmol/L (ref 135–145)

## 2018-09-26 LAB — MAGNESIUM: Magnesium: 2.1 mg/dL (ref 1.7–2.4)

## 2018-09-26 LAB — TROPONIN I: Troponin I: 14.92 ng/mL (ref <0.03)

## 2018-09-26 MED ORDER — TICAGRELOR 90 MG PO TABS: 90.0000 mg | ORAL_TABLET | Freq: Two times a day (BID) | ORAL | 11 refills | Status: DC

## 2018-09-26 MED ORDER — ATORVASTATIN CALCIUM 80 MG PO TABS: 80.0000 mg | ORAL_TABLET | Freq: Every evening | ORAL | 6 refills | Status: DC

## 2018-09-26 MED ORDER — NITROGLYCERIN 0.4 MG SL SUBL: 0.4000 mg | SUBLINGUAL_TABLET | SUBLINGUAL | 3 refills | Status: DC | PRN

## 2018-09-26 MED ORDER — ASPIRIN 81 MG PO TABS: 81.0000 mg | ORAL_TABLET | Freq: Every day | ORAL | 11 refills | Status: AC

## 2018-09-26 MED FILL — NITROGLYCERIN 0.4 MG TAB SL: 0.4 | 8 days supply | Qty: 25 | Fill #0 | Status: TO

## 2018-09-26 MED FILL — BRILINTA 90 MG TABLET: 90 | 30 days supply | Qty: 60 | Fill #0 | Status: TO

## 2018-09-26 MED FILL — ASPIRIN LOW DOSE 81 MG TBEC: 81 | 30 days supply | Qty: 30 | Fill #0 | Status: TO

## 2018-09-26 MED FILL — ATORVASTATIN CALCIUM 80 MG: 80 | 30 days supply | Qty: 30 | Fill #0 | Status: TO

## 2018-09-26 NOTE — Progress Notes (Signed)
CARDIAC REHAB PHASE I   PRE:  Rate/Rhythm: 72 SR  BP:  Supine:   Sitting: 126/74  Standing:    SaO2:95%RA   MODE:  Ambulation: 600 ft   POST:  Rate/Rhythm: 82 SR  BP:  Supine:   Sitting: 118/79  Standing:    SaO2: 96%RA [id number] Pt walked 600 ft with steady gait and no CP. Did c/o feeling a little SOB. MI education completed with pt who voiced understanding. Stressed importance of brilinta with stent. Needs to see case manager for brilinta card. Discussed MI restrictions, NTG use, ex ed, and heart healthy diet. Discussed CRP 2 and referred to [facility] program.    Graylon Good, RN BSN  09/26/2018 8:54 AM

## 2018-09-26 NOTE — Telephone Encounter (Signed)
Patient d/c today from [facility] will call tomorrow.

## 2018-09-26 NOTE — Telephone Encounter (Signed)
-----   Message from Charlie Pitter, [place] sent at 09/25/2018  4:49 PM EST ----- Regarding: Will need TOC phone call - not yet discharged I anticipate this patient will be discharged tomorrow, so have planned for him to come to see [facility] 11/27 for [facility] visit. He will need phone call after discharge. NSTEMI. Thanks. Dayna Dunn PA-C

## 2018-09-26 NOTE — Progress Notes (Addendum)
Progress Note  Patient Name: Edward Freeman Date of Encounter: 09/26/2018  Primary Cardiologist: Quay Burow, MD  Subjective   Feeling great this morning. Some R forearm swelling but no significant pain.   Inpatient Medications    Scheduled Meds: . amLODipine  10 mg Oral Daily  . aspirin  81 mg Oral Daily  . atorvastatin  80 mg Oral q1800  . sodium chloride flush  3 mL Intravenous Q12H  . ticagrelor  90 mg Oral BID   Continuous Infusions: . sodium chloride 10 mL/hr at 09/24/18 1440  . sodium chloride    . sodium chloride    . nitroGLYCERIN 20 mcg/min (09/25/18 0721)   PRN Meds: sodium chloride, sodium chloride, acetaminophen, morphine injection, nitroGLYCERIN, ondansetron (ZOFRAN) IV, sodium chloride flush   Vital Signs    Vitals:   09/25/18 1943 09/25/18 1945 09/25/18 2000 09/26/18 0124  BP: (!) 146/76 (!) 146/76  (!) 142/79  Pulse: 75 63 66 74  Resp: (!) 27 20 20 18   Temp: 98.9 F (37.2 C)   98.5 F (36.9 C)  TempSrc: Oral   Oral  SpO2: 100% 99% 99% 97%  Weight:    89 kg  Height:        Intake/Output Summary (Last 24 hours) at 09/26/2018 0645 Last data filed at 09/26/2018 0126 Gross per 24 hour  Intake 480 ml  Output 2200 ml  Net -1720 ml   Filed Weights   09/24/18 1240 09/25/18 0605 09/26/18 0124  Weight: 99.8 kg 88.3 kg 89 kg    Telemetry    NSR, very brief sinus pause overnight <2 sec - Personally Reviewed  ECG    Post PCI tracing still showed NSR with subtle inferior ST upsloping with Q waves inferiorly , nonspecific TW changes in avL. Personally Reviewed  Physical Exam   GEN: No acute distress.  HEENT: Normocephalic, atraumatic, sclera non-icteric. Neck: No JVD or bruits. Cardiac: RRR no murmurs, rubs, or gallops.  Radials/DP/PT 1+ and equal bilaterally.  Respiratory: Clear to auscultation bilaterally. Breathing is unlabored. GI: Soft, nontender, non-distended, BS +x 4. MS: no deformity. Extremities: No clubbing or cyanosis. No  edema. Distal pedal pulses are 2+ and equal bilaterally. Right radial site with some tenseness of the wrist with swelling and ecchymosis but good cap refill, no oozing, and good pulse. Neuro:  AAOx3. Follows commands. Psych:  Responds to questions appropriately with a normal affect.  Labs    Chemistry Recent Labs  Lab 09/25/18 0127 09/25/18 0253 09/26/18 0338  NA 132* 140 142  K 6.0* 4.1 3.8  CL 106 112* 109  CO2 22 20* 24  GLUCOSE 151* 132* 105*  BUN 12 12 10   CREATININE 0.85 0.96 1.00  CALCIUM 8.0* 8.5* 9.0  PROT  --   --  7.1  ALBUMIN  --   --  3.7  AST  --   --  107*  ALT  --   --  38  ALKPHOS  --   --  56  BILITOT  --   --  1.6*  GFRNONAA >60 >60 >60  GFRAA >60 >60 >60  ANIONGAP 4* 8 9     Hematology Recent Labs  Lab 09/24/18 1239 09/25/18 0732 09/26/18 0338  WBC 7.1 9.7 8.6  RBC 5.20 4.85 5.54  HGB 15.3 13.6 15.4  HCT 46.0 42.7 48.2  MCV 88.5 88.0 87.0  MCH 29.4 28.0 27.8  MCHC 33.3 31.9 32.0  RDW 13.2 13.3 13.3  PLT 211 214 227  Cardiac Enzymes Recent Labs  Lab 09/24/18 1813 09/24/18 2108 09/25/18 0732 09/26/18 0338  TROPONINI 0.19* 0.83* 12.75* 14.92*   No results for input(s): TROPIPOC in the last 168 hours.   BNPNo results for input(s): BNP, PROBNP in the last 168 hours.   DDimer No results for input(s): DDIMER in the last 168 hours.   Radiology    Dg Chest 2 View  Result Date: 09/24/2018 CLINICAL DATA:  Chest pain, weakness, diaphoresis EXAM: CHEST - 2 VIEW COMPARISON:  01/14/2010 FINDINGS: Lungs are clear. No pleural effusion or pneumothorax. The heart is normal in size. Visualized osseous structures are within normal limits. IMPRESSION: Normal chest radiographs. Electronically Signed   By: Julian Hy M.D.   On: 09/24/2018 12:58    Cardiac Studies   1. 2d echo yesterday Study Conclusions - Left ventricle: The cavity size was normal. Wall thickness was   normal. Systolic function was normal. The estimated ejection    fraction was in the range of 55% to 60%. Basal inferior   hypokinesis. Doppler parameters are consistent with abnormal left   ventricular relaxation (grade 1 diastolic dysfunction). The E/e&'   ratio is <8, suggesting normal LV filling pressure. - Mitral valve: Mildly thickened leaflets . There was trivial   regurgitation. - Left atrium: The atrium was normal in size. Impressions: - Compared to a prior study in 11/2017, the LVEF is unchanged at   55-60%, with possible basal inferior hypokinesis and grade 1 DD   with normal LV filling pressure  2. Cardiac catheterization this admission, please see full report and below for summary.   Patient Profile     67 y.o. male with PSVT, GERD, HTN (not on meds), HLD by labs this admission who presented to the hospital with CP, SOB, diaphoresis and nausea and found to have NSTEMI. Was previously evaluated in 11/2017 for atypical CP and palpitations with normal nuc; echo with EF 55-60% with mild AI/MR/TR. Event monitor earlier this year showed PSVT -> was referred to Dr. Curt Bears for eval but appt did not occur.  Assessment & Plan    1. NSTEMI - s/p DES to 100% RCA (L-R collaterals were present), LVEF wnl, echo with possible basal inferior HK. Recommend uninterrupted dual antiplatelet therapy with Aspirin 81mg  daily and Ticagrelor 90mg  twice daily for a minimum of 12 months. No BB given baseline bradycardia. Continue new statin. Will await MD eval of radial cath prior to finalizing plans for DC. Troponin 14 this AM (suspect probably peaked later yesterday). Patient also works with a Armed forces operational officer and is interested to know when to return to work.  2. Sinus bradycardia - improved s/p PCI and off beta blocker.  3. Hypokalemia - repleted; subsequent value hemolyzed then repeat normal.  4. Brief run of ventricular ectopy this admission (5 beats), also with h/o PSVT - no further episodes on tele. No PSVT seen this admission. Can reconsider EP eval as OP  to consider ablation. He did not follow-up with them because those symptoms had not recurred.  5. HTN - amlodipine resumed last night. Continue to follow as OP. BP 134/72 this AM. Discussed OP monitoring with patient. Wife has cuff.  6. Hyperlipidemia - started on statin for LDL 128. AST 107, suspected due to MI. Can consider rechecking in follow-up to make sure stable. Also, if the patient is tolerating statin at time of follow-up appointment, would consider rechecking liver function/lipid panel in 6-8 weeks.  TOC f/u has been arranged for next Wed.  For questions or updates, please contact [place] Please consult [email or web address] for contact info under Cardiology/STEMI.  Signed, Charlie Pitter, PA-C 09/26/2018, 6:45 AM    I have seen and examined the patient along with Charlie Pitter, PA-C.  I have reviewed the chart, notes and new data.  I agree with PA/NP's note.  Key new complaints: no complaints at either arterial access site. No angina Key examination changes: small wrist ecchymosis, no hematoma Key new findings / data: angiograms reviewed. Good coronary PCI result. 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  PLAN: DC home. Reinforced mandatory DAPT 12 months. High dose atorvastatin. Bradycardia precludes beta blocker. Hold off returning to work for a couple of weeks.  Sanda Klein, MD, [place] [phone] 09/26/2018, 8:49 AM

## 2018-09-26 NOTE — Care Management Note (Signed)
Case Management Note  Patient Details  Name: Edward Freeman MRN: [id number] Date of Birth: Nov 04, 1951  Subjective/Objective:  From home, pta indep, s/p stent intervention, will be on brilinta, and transition of care pharmacy will provide the 30 day free for him today prior to dc.   NCM spoke with patient and informed him that his refills will be around 45.00 and that the cardiologist also has samples in the office.                   Action/Plan: DC home when ready.  Expected Discharge Date:                  Expected Discharge Plan:  Home/Self Care  In-House Referral:     Discharge planning Services  CM Consult  Post Acute Care Choice:    Choice offered to:     DME Arranged:    DME Agency:     HH Arranged:    [place] Agency:     Status of Service:  Completed, signed off  If discussed at [employer] of Stay Meetings, dates discussed:    Additional Comments:  Zenon Mayo, RN 09/26/2018, 9:16 AM

## 2018-09-27 NOTE — Telephone Encounter (Signed)
Patient contacted regarding discharge from [place] on 09/26/18.  Patient understands to follow up with provider KILROY on 10/04/18 at 8:30 AM at [facility]. Patient understands discharge instructions? yes  Patient understands medications and regiment? yes Patient understands to bring all medications to this visit? yes

## 2018-09-28 ENCOUNTER — Telehealth (HOSPITAL_COMMUNITY): Payer: Self-pay

## 2018-09-28 NOTE — Telephone Encounter (Signed)
Called patient to see if he is interested in the Cardiac Rehab Program. Patient stated not at this time. ° °Closed referral °

## 2018-09-28 NOTE — Telephone Encounter (Signed)
Pt insurance is active and benefits verified through [facility]. Co-pay $10.00, DED $0.00/$0.00 met, out of pocket $3,400.00/$500.87 met, co-insurance 0%. No pre-authorization required. [name], 09/28/18 @ 12:10PM, REF# [id number]  Will contact patient to see if he is interested in the Cardiac Rehab Program. If interested, patient will need to complete follow up appt. Once completed, patient will be contacted for scheduling upon review by the RN Navigator.

## 2018-09-29 NOTE — Telephone Encounter (Signed)
This encounter was created in error - please disregard.

## 2018-10-04 ENCOUNTER — Encounter: Payer: Self-pay | Admitting: Cardiology

## 2018-10-04 ENCOUNTER — Ambulatory Visit: Payer: Medicare HMO | Admitting: Cardiology

## 2018-10-04 VITALS — BP 138/70 | HR 59 | Ht 70.0 in | Wt 195.2 lb

## 2018-10-04 DIAGNOSIS — I2510 Atherosclerotic heart disease of native coronary artery without angina pectoris: Secondary | ICD-10-CM | POA: Diagnosis not present

## 2018-10-04 DIAGNOSIS — Z9861 Coronary angioplasty status: Secondary | ICD-10-CM

## 2018-10-04 DIAGNOSIS — R001 Bradycardia, unspecified: Secondary | ICD-10-CM

## 2018-10-04 DIAGNOSIS — E785 Hyperlipidemia, unspecified: Secondary | ICD-10-CM | POA: Diagnosis not present

## 2018-10-04 DIAGNOSIS — I10 Essential (primary) hypertension: Secondary | ICD-10-CM | POA: Diagnosis not present

## 2018-10-04 DIAGNOSIS — I214 Non-ST elevation (NSTEMI) myocardial infarction: Secondary | ICD-10-CM

## 2018-10-04 LAB — HEPATIC FUNCTION PANEL
ALT: 25 IU/L (ref 0–44)
AST: 17 IU/L (ref 0–40)
Albumin: 4 g/dL (ref 3.6–4.8)
Alkaline Phosphatase: 73 IU/L (ref 39–117)
Bilirubin Total: 0.6 mg/dL (ref 0.0–1.2)
Bilirubin, Direct: 0.17 mg/dL (ref 0.00–0.40)
Total Protein: 6.6 g/dL (ref 6.0–8.5)

## 2018-10-04 NOTE — Assessment & Plan Note (Signed)
Check LFTs- Lipitor is new and his LFTs were elevated in the hospital

## 2018-10-04 NOTE — Assessment & Plan Note (Signed)
Occluded RCA with L-R collaterals, no left system disease, normal LVF- s/p PCI with DES 09/15/18

## 2018-10-04 NOTE — Assessment & Plan Note (Signed)
He will monitor his B/P at home, he may need additional Rx-? ACE

## 2018-10-04 NOTE — Patient Instructions (Signed)
Medication Instructions:  Your physician recommends that you continue on your current medications as directed. Please refer to the Current Medication list given to you today.  If you need a refill on your cardiac medications before your next appointment, please call your pharmacy.   Lab work: Your physician recommends that you return for lab work in: TODAY-LFT If you have labs (blood work) drawn today and your tests are completely normal, you will receive your results only by: Marland Kitchen MyChart Message (if you have MyChart) OR . A paper copy in the mail If you have any lab test that is abnormal or we need to change your treatment, we will call you to review the results.  Testing/Procedures: NONE   Follow-Up: At [facility], you and your health needs are our priority.  As part of our continuing mission to provide you with exceptional heart care, we have created designated Provider Care Teams.  These Care Teams include your primary Cardiologist (physician) and Advanced Practice Providers (APPs -  Physician Assistants and Nurse Practitioners) who all work together to provide you with the care you need, when you need it. . Your physician recommends that you schedule a follow-up appointment in: [place].  Any Other Special Instructions Will Be Listed Below (If Applicable). WORK NOTE GIVEN

## 2018-10-04 NOTE — Assessment & Plan Note (Signed)
Admitted with NSTEMI  09/24/18-Troponin peak 15 EF 55-60%

## 2018-10-04 NOTE — Progress Notes (Signed)
10/04/2018 RENSO SWETT   [birth year]  [id number]  Primary Physician Carlisle Cater, Tommi Rumps, NP Primary Cardiologist: Dr Gwenlyn Found  HPI: Mr. Swor is a pleasant 67 year old African-American male followed by Dr. Alvester Chou.  He is actually had prior catheterization in 2004 that showed normal coronaries.  He presented September 24, 2018 with a non-ST elevation MI.  His troponins went to 15.  At catheterization he had a mid RCA occlusion with left-to-right collaterals and no significant left system disease.  He underwent PCI with DES.  He is in the office today for follow-up.  He is been doing well since discharge.  He has had some swelling and some minor discomfort at his right radial site.  At the time of his catheterization is ejection fraction was normal.  He did have elevated LFTs.  This was possibly related to his non-ST elevation MI.  He was put on a high-dose statin and we will recheck his LFTs today.   Current Outpatient Medications  Medication Sig Dispense Refill  . amLODipine (NORVASC) 10 MG tablet TAKE 1 TABLET (10 MG TOTAL) BY MOUTH DAILY. 90 tablet 3  . aspirin 81 MG tablet Take 1 tablet (81 mg total) by mouth daily. 30 tablet 11  . atorvastatin (LIPITOR) 80 MG tablet Take 1 tablet (80 mg total) by mouth every evening. 30 tablet 6  . finasteride (PROSCAR) 5 MG tablet Take 1 tablet (5 mg total) by mouth daily. 90 tablet 3  . nitroGLYCERIN (NITROSTAT) 0.4 MG SL tablet Place 1 tablet (0.4 mg total) under the tongue every 5 (five) minutes as needed for chest pain (up to 3 doses. If taking 3rd dose call 911). 25 tablet 3  . ticagrelor (BRILINTA) 90 MG TABS tablet Take 1 tablet (90 mg total) by mouth 2 (two) times daily. 60 tablet 11   No current facility-administered medications for this visit.     No Known Allergies  Past Medical History:  Diagnosis Date  . CAD (coronary artery disease)    a. NSTEMI 09/2018 s/p DES to RCA, EF 55-60%.  Marland Kitchen GERD (gastroesophageal reflux disease)   .  Hyperlipidemia   . Hypertension   . Migraines   . Nodule of left lung   . PSVT (paroxysmal supraventricular tachycardia) ([place])   . Sinus bradycardia   . Syncope and collapse     Social History   Socioeconomic History  . Marital status: Single    Spouse name: Not on file  . Number of children: Not on file  . Years of education: Not on file  . Highest education level: Not on file  Occupational History  . Not on file  Social Needs  . Financial resource strain: Not on file  . Food insecurity:    Worry: Not on file    Inability: Not on file  . Transportation needs:    Medical: Not on file    Non-medical: Not on file  Tobacco Use  . Smoking status: Never Smoker  . Smokeless tobacco: Never Used  Substance and Sexual Activity  . Alcohol use: No    Comment: 1-2 beers a month  . Drug use: No  . Sexual activity: Not on file  Lifestyle  . Physical activity:    Days per week: Not on file    Minutes per session: Not on file  . Stress: Not on file  Relationships  . Social connections:    Talks on phone: Not on file    Gets together: Not on file  Attends religious service: Not on file    Active member of club or organization: Not on file    Attends meetings of clubs or organizations: Not on file    Relationship status: Not on file  . Intimate partner violence:    Fear of current or ex partner: Not on file    Emotionally abused: Not on file    Physically abused: Not on file    Forced sexual activity: Not on file  Other Topics Concern  . Not on file  Social History Narrative  . Not on file     Family History  Problem Relation Age of Onset  . Hypertension Mother   . Stroke Mother   . Alcohol abuse Father   . Diabetes Sister   . Diabetes Brother   . Diabetes Sister   . Colon cancer Neg Hx   . Esophageal cancer Neg Hx   . Rectal cancer Neg Hx   . Stomach cancer Neg Hx      Review of Systems: General: negative for chills, fever, night sweats or weight changes.    Cardiovascular: negative for chest pain, dyspnea on exertion, edema, orthopnea, palpitations, paroxysmal nocturnal dyspnea or shortness of breath Dermatological: negative for rash Respiratory: negative for cough or wheezing Urologic: negative for hematuria Abdominal: negative for nausea, vomiting, diarrhea, bright red blood per rectum, melena, or hematemesis Neurologic: negative for visual changes, syncope, or dizziness All other systems reviewed and are otherwise negative except as noted above.    Blood pressure 138/70, pulse (!) 59, height 5\' 10"  (1.778 m), weight 195 lb 3.2 oz (88.5 kg), SpO2 98 %.  General appearance: alert, cooperative and no distress Neck: no carotid bruit and no JVD Lungs: clear to auscultation bilaterally Heart: regular rate and rhythm Extremities: Rt forarm ecchymotic, small hematoma Skin: Skin color, texture, turgor normal. No rashes or lesions Neurologic: Grossly normal   ASSESSMENT AND PLAN:   NSTEMI (non-ST elevated myocardial infarction) ([place]) Admitted with NSTEMI  09/24/18-Troponin peak 15 EF 55-60%  CAD S/P percutaneous coronary angioplasty Occluded RCA with L-R collaterals, no left system disease, normal LVF- s/p PCI with DES 09/15/18  Essential hypertension He will monitor his B/P at home, he may need additional Rx-? ACE  Sinus bradycardia No beta blocker  Dyslipidemia, goal LDL below 70 Check LFTs- Lipitor is new and his LFTs were elevated in the hospital   PLAN the patient works for an Loss adjuster, chartered.  Is actually pretty strenuous work.  I suggested he not go back to work until he sees Dr. Alvester Chou in follow-up in about 4 weeks.  We ordered LFTs today and will follow this up.  He will monitor his blood pressure at home and bring the readings into Dr. Alvester Chou, he may need additional treatment for hypertension, possibly an ACE inhibitor.  Kerin Ransom PA-C 10/04/2018 8:41 AM

## 2018-10-04 NOTE — Assessment & Plan Note (Signed)
No beta blocker

## 2018-10-18 ENCOUNTER — Other Ambulatory Visit: Payer: Self-pay | Admitting: Cardiovascular Disease

## 2018-10-18 MED ORDER — TICAGRELOR 90 MG PO TABS: 90.0000 mg | ORAL_TABLET | Freq: Two times a day (BID) | ORAL | 1 refills | Status: DC

## 2018-10-18 MED ORDER — ATORVASTATIN CALCIUM 80 MG PO TABS: 80.0000 mg | ORAL_TABLET | Freq: Every evening | ORAL | 1 refills | Status: DC

## 2018-10-18 NOTE — Telephone Encounter (Signed)
 *  STAT* If patient is at the pharmacy, call can be transferred to refill team.   1. Which medications need to be refilled? (please list name of each medication and dose if known) atorvastatin (LIPITOR) 80 MG tablet ticagrelor (BRILINTA) 90 MG TABS tablet  2. Which pharmacy/location (including street and city if local pharmacy) is medication to be sent to? [facility]  3. Do they need a 30 day or 90 day supply? 30  Only has a few days left of these meds

## 2018-10-18 NOTE — Telephone Encounter (Signed)
rx sent to pharmacy

## 2018-10-25 ENCOUNTER — Telehealth: Payer: Self-pay | Admitting: Cardiovascular Disease

## 2018-10-25 ENCOUNTER — Telehealth: Payer: Self-pay

## 2018-10-25 MED ORDER — TICAGRELOR 90 MG PO TABS: 90.0000 mg | ORAL_TABLET | Freq: Two times a day (BID) | ORAL | 1 refills | Status: DC

## 2018-10-25 MED ORDER — ATORVASTATIN CALCIUM 80 MG PO TABS: 80.0000 mg | ORAL_TABLET | Freq: Every evening | ORAL | 1 refills | Status: DC

## 2018-10-25 NOTE — Telephone Encounter (Signed)
Pt walked in to the office. Pt sts that he has been waiting to receive his prescriptions from his mail order pharmacy that were shipped out on 10/21/18. Pt sts that he is down to a 2 day supply of Brilinta and he is concerned that he may not receive his prescription in time. Pt attempted to pick up the prescription that was sent to his local pharmacy, but the cost was not affordable at $400 a month. Pt provided with Brilinta samples 90mg  2 bottles. Pt understands the importance of not interrupting his DAPT. Adv pt to contact the office if he has issues obtaining his prescriptions. Pt verbalized understanding and voiced appreciation for the assistance.

## 2018-10-25 NOTE — Telephone Encounter (Signed)
New Message          *STAT* If patient is at the pharmacy, call can be transferred to refill team.   1. Which medications need to be refilled? (please list name of each medication and dose if known) Lipitor 80 mg/Brilinta 90 mg  2. Which pharmacy/location (including street and city if local pharmacy) is medication to be sent to? [facility]  3. Do they need a 30 day or 90 day supply? 10 days,will be after tonight.      Patient is waiting on mail order.

## 2018-11-02 ENCOUNTER — Ambulatory Visit: Payer: Medicare HMO | Admitting: Cardiovascular Disease

## 2018-11-03 ENCOUNTER — Ambulatory Visit (INDEPENDENT_AMBULATORY_CARE_PROVIDER_SITE_OTHER): Payer: Medicare HMO | Admitting: Sports Medicine

## 2018-11-03 ENCOUNTER — Encounter: Payer: Self-pay | Admitting: Cardiovascular Disease

## 2018-11-03 ENCOUNTER — Ambulatory Visit: Payer: Medicare HMO | Admitting: Cardiovascular Disease

## 2018-11-03 ENCOUNTER — Encounter: Payer: Self-pay | Admitting: Sports Medicine

## 2018-11-03 ENCOUNTER — Encounter (INDEPENDENT_AMBULATORY_CARE_PROVIDER_SITE_OTHER): Payer: Self-pay

## 2018-11-03 VITALS — BP 134/72 | HR 60 | Ht 70.0 in | Wt 192.0 lb

## 2018-11-03 VITALS — BP 120/82 | HR 76 | Ht 70.0 in | Wt 193.6 lb

## 2018-11-03 DIAGNOSIS — I214 Non-ST elevation (NSTEMI) myocardial infarction: Secondary | ICD-10-CM

## 2018-11-03 DIAGNOSIS — M479 Spondylosis, unspecified: Secondary | ICD-10-CM | POA: Diagnosis not present

## 2018-11-03 DIAGNOSIS — M9904 Segmental and somatic dysfunction of sacral region: Secondary | ICD-10-CM

## 2018-11-03 DIAGNOSIS — I10 Essential (primary) hypertension: Secondary | ICD-10-CM | POA: Diagnosis not present

## 2018-11-03 DIAGNOSIS — M9905 Segmental and somatic dysfunction of pelvic region: Secondary | ICD-10-CM

## 2018-11-03 DIAGNOSIS — I2510 Atherosclerotic heart disease of native coronary artery without angina pectoris: Secondary | ICD-10-CM | POA: Diagnosis not present

## 2018-11-03 DIAGNOSIS — M5416 Radiculopathy, lumbar region: Secondary | ICD-10-CM

## 2018-11-03 DIAGNOSIS — E785 Hyperlipidemia, unspecified: Secondary | ICD-10-CM

## 2018-11-03 DIAGNOSIS — Z9861 Coronary angioplasty status: Secondary | ICD-10-CM

## 2018-11-03 DIAGNOSIS — M9903 Segmental and somatic dysfunction of lumbar region: Secondary | ICD-10-CM | POA: Diagnosis not present

## 2018-11-03 MED ORDER — PREDNISONE 50 MG PO TABS: 50.0000 mg | ORAL_TABLET | Freq: Every day | ORAL | 0 refills | Status: AC

## 2018-11-03 NOTE — Assessment & Plan Note (Signed)
History of essential hypertension with blood pressure measured today at 134/72.  He is on amlodipine.  Continue current meds at current dosing.

## 2018-11-03 NOTE — Patient Instructions (Signed)
Medication Instructions:  Your physician recommends that you continue on your current medications as directed. Please refer to the Current Medication list given to you today.  If you need a refill on your cardiac medications before your next appointment, please call your pharmacy.   Lab work: Your physician recommends that you return for lab work in 1-2 months: LIPID/LIVER  If you have labs (blood work) drawn today and your tests are completely normal, you will receive your results only by: Marland Kitchen MyChart Message (if you have MyChart) OR . A paper copy in the mail If you have any lab test that is abnormal or we need to change your treatment, we will call you to review the results.  Testing/Procedures: NONE  Follow-Up: At [facility], you and your health needs are our priority.  As part of our continuing mission to provide you with exceptional heart care, we have created designated Provider Care Teams.  These Care Teams include your primary Cardiologist (physician) and Advanced Practice Providers (APPs -  Physician Assistants and Nurse Practitioners) who all work together to provide you with the care you need, when you need it. You will need a follow up appointment in 6 months with Kerin Ransom, PA and in 12 months with Quay Burow, MD.  Please call our office 2 months in advance to schedule this appointment.

## 2018-11-03 NOTE — Progress Notes (Signed)
Edward Freeman. Edward Freeman, [place] at [place]  Edward Freeman - 67 y.o. male MRN [id number]  Date of birth: Sep 18, 1951  Visit Date: 11/03/2018  PCP: Dorothyann Peng, NP   Referred by: Dorothyann Peng, NP  SUBJECTIVE:   Chief Complaint  Patient presents with  . f/u LBP    HPI: Patient presents for low back pain that is been present over the past 3 weeks.  Unfortunately he was hospitalized with an NSTEMI and has recently been trying to increase his activity.  He is feeling significantly better and not having any significant chest pain at this time.  He is having pain is going into the left and right glutes into the posterior aspect of his legs.  He is no longer able to take anti-inflammatories due to the anticoagulation.  He has been using heat and does report being fairly immobile for quite some time while recovering and sitting in a recliner most of this time.  REVIEW OF SYSTEMS: He is having fairly significant nighttime disturbances. Denies fevers, chills, recent weight gain or weight loss.  No night sweats.  Pt denies any change in bowel or bladder habits, muscle weakness, numbness or falls associated with this pain. Lower extremity edema has been improving but is present.   HISTORY:  Prior history reviewed and updated per electronic medical record.  Patient Active Problem List   Diagnosis Date Noted  . Chest pain   . History of PSVT (paroxysmal supraventricular tachycardia) 09/25/2018  . Sinus bradycardia 09/25/2018    No beta blocker   . CAD S/P percutaneous coronary angioplasty 09/25/2018    Occluded RCA with L-R collaterals, no left system disease, normal LVF- s/p PCI with DES 09/15/18   . NSTEMI (non-ST elevated myocardial infarction) [facility])     Admitted with NSTEMI  09/24/18   . Unstable angina ([place]) 09/24/2018  . Lumbar radiculopathy 05/12/2018    04/04/18 -nerve conduction studies of the left lower extremity  Chronic L4 radiculopathy affecting the left lower extremity, mild in degree electrically.   Marland Kitchen Spondylosis 03/28/2018  . Palpitations 10/21/2017    Palpitations   . Dyslipidemia, goal LDL below 70 10/21/2015    hyperlipidemia   . Essential hypertension 05/17/2013    hypertension   . SOB (shortness of breath) 05/17/2013    Dyspnea on exertion   . Leg pain 05/17/2013    Leg pain    Social History   Occupational History  . Not on file  Tobacco Use  . Smoking status: Never Smoker  . Smokeless tobacco: Never Used  Substance and Sexual Activity  . Alcohol use: No    Comment: 1-2 beers a month  . Drug use: No  . Sexual activity: Not on file   Social History   Social History Narrative  . Not on file     OBJECTIVE:  VS:  HT:5\' 10"  (177.8 cm)   WT:193 lb 9.6 oz (87.8 kg)  BMI:27.78    BP:120/82  HR:76bpm  TEMP: ( )  RESP:    PHYSICAL EXAM: Adult male. No acute distress.  Alert and appropriate. EYES: Pupils are equal., EOM intact without nystagmus. and No scleral icterus. Psychiatric: Alert & appropriately interactive. and Not depressed or anxious appearing. EXTREMITY EXAM: Warm and well perfused Markedly tight lumbar range of motion.  He has good internal and external rotation of bilateral hips does have pain with terminal Corky Sox testing. Negative straight leg raises Lower extremity strength is 5/5  in all myotomes. Normal sensation Significant anterior chain dominant posture.    ASSESSMENT:   1. Lumbar radiculopathy   2. Spondylosis   3. NSTEMI (non-ST elevated myocardial infarction) ([place])   4. CAD S/P percutaneous coronary angioplasty   5. Somatic dysfunction of lumbar region   6. Somatic dysfunction of pelvis region   7. Somatic dysfunction of sacral region     PROCEDURES:  Osteopathic manipulation was performed today based on physical exam findings.  Please see procedure note for further information including Osteopathic Exam findings    PLAN:    Pertinent additional documentation may be included in corresponding procedure notes, imaging studies, problem based documentation and patient instructions.  No problem-specific Assessment & Plan notes found for this encounter.  Suspect that the immobility is actually causing the majority of his symptoms to cause a left-sided hip flexor contracture.  He responded well to osteopathic manipulation but given the duration of the pain and symptoms that do radiate I would like to start him on a short course of steroids  Discussed the underlying features of tight hip flexors leading to crouched, fetal like position that results in spinal column compression.  Including lumbar hyperflexion with hypermobility, thoracic flexion with restrictive rotation and cervical lordosis reversal.   Osteopathic manipulation was performed today based on physical exam findings.  Patient was counseled on the purpose and expected outcome of osteopathic manipulation and understands that a single treatment may not provide permanent long lasting relief.  They understand that home therapeutic exercises are critical part of the healing/treatment process and will continue with self treatment between now and their next visit as outlined.  The patient understands that the frequency of visits is meant to provide a stimulus to promote the body's own ability to heal and is not meant to be the sole means for improvement in their symptoms.  Activity modifications and the importance of avoiding exacerbating activities (limiting pain to no more than a 4 / 10 during or following activity) recommended and discussed.  Discussed red flag symptoms that warrant earlier emergent evaluation and patient voices understanding.   Meds ordered this encounter  Medications  . predniSONE (DELTASONE) 50 MG tablet    Sig: Take 1 tablet (50 mg total) by mouth daily with breakfast for 5 days.    Dispense:  5 tablet    Refill:  0   Lab Orders  No  laboratory test(s) ordered today   Imaging Orders  No imaging studies ordered today   Referral Orders  No referral(s) requested today    Return in about 2 weeks (around 11/17/2018) for consideration of repeat Osteopathic Manipulation, repeat clinical exam.          Gerda Diss, [facility] Physician

## 2018-11-03 NOTE — Progress Notes (Signed)
11/03/2018 JAVAD SALVA   05/23/1951  [id number]  Primary Physician Carlisle Cater, Tommi Rumps, NP Primary Cardiologist: Lorretta Harp MD [name], [place]  HPI:  Edward Freeman is a 67 y.o.  married African American male father of 2, grandfather to 2 grandchildren he works doing Furniture conservator/restorer.I last saw him in the office  10/21/2017. I performed cardiac catheterization on him 11/04/03 which was completely normal. His cardiac risk factors include hypertension as well as family history with a mother who died of a myocardial infarction at age 63. He denies chest pain or shortness of breath. He also had a small apical nodule by CT scan several years ago with recommendations of followup which was never pursued. I've offered him follow-up CT scans which she has declined because of fiscal constraints.  Since I saw him a year ago he did did well until 09/25/2018 when he presented with a non-STEMI.  His troponin rose to 15.  He underwent cardiac catheterization by Dr. Irish Lack 09/25/2018 demonstrating an occluded dominant RCA with left-to-right collaterals.  He was stented with a synergy 3.5 mm x 20 mm long drug-eluting stent with excellent result.  He remains on aspirin and Brilinta and is pain-free.  He does exercise on a treadmill several times a week without limitation.  His major complaint has been back and leg pain probably from his sciatica.  Current Meds  Medication Sig  . amLODipine (NORVASC) 10 MG tablet TAKE 1 TABLET (10 MG TOTAL) BY MOUTH DAILY.  Marland Kitchen aspirin 81 MG tablet Take 1 tablet (81 mg total) by mouth daily.  Marland Kitchen atorvastatin (LIPITOR) 80 MG tablet Take 1 tablet (80 mg total) by mouth every evening.  . finasteride (PROSCAR) 5 MG tablet Take 1 tablet (5 mg total) by mouth daily.  . nitroGLYCERIN (NITROSTAT) 0.4 MG SL tablet Place 1 tablet (0.4 mg total) under the tongue every 5 (five) minutes as needed for chest pain (up to 3 doses. If taking 3rd dose call 911).  .  ticagrelor (BRILINTA) 90 MG TABS tablet Take 1 tablet (90 mg total) by mouth 2 (two) times daily.     No Known Allergies  Social History   Socioeconomic History  . Marital status: Single    Spouse name: Not on file  . Number of children: Not on file  . Years of education: Not on file  . Highest education level: Not on file  Occupational History  . Not on file  Social Needs  . Financial resource strain: Not on file  . Food insecurity:    Worry: Not on file    Inability: Not on file  . Transportation needs:    Medical: Not on file    Non-medical: Not on file  Tobacco Use  . Smoking status: Never Smoker  . Smokeless tobacco: Never Used  Substance and Sexual Activity  . Alcohol use: No    Comment: 1-2 beers a month  . Drug use: No  . Sexual activity: Not on file  Lifestyle  . Physical activity:    Days per week: Not on file    Minutes per session: Not on file  . Stress: Not on file  Relationships  . Social connections:    Talks on phone: Not on file    Gets together: Not on file    Attends religious service: Not on file    Active member of club or organization: Not on file    Attends meetings of clubs or organizations:  Not on file    Relationship status: Not on file  . Intimate partner violence:    Fear of current or ex partner: Not on file    Emotionally abused: Not on file    Physically abused: Not on file    Forced sexual activity: Not on file  Other Topics Concern  . Not on file  Social History Narrative  . Not on file     Review of Systems: General: negative for chills, fever, night sweats or weight changes.  Cardiovascular: negative for chest pain, dyspnea on exertion, edema, orthopnea, palpitations, paroxysmal nocturnal dyspnea or shortness of breath Dermatological: negative for rash Respiratory: negative for cough or wheezing Urologic: negative for hematuria Abdominal: negative for nausea, vomiting, diarrhea, bright red blood per rectum, melena, or  hematemesis Neurologic: negative for visual changes, syncope, or dizziness All other systems reviewed and are otherwise negative except as noted above.    Blood pressure 134/72, pulse 60, height 5\' 10"  (1.778 m), weight 192 lb (87.1 kg).  General appearance: alert and no distress Neck: no adenopathy, no carotid bruit, no JVD, supple, symmetrical, trachea midline and thyroid not enlarged, symmetric, no tenderness/mass/nodules Lungs: clear to auscultation bilaterally Heart: regular rate and rhythm, S1, S2 normal, no murmur, click, rub or gallop Extremities: extremities normal, atraumatic, no cyanosis or edema Pulses: 2+ and symmetric Skin: Skin color, texture, turgor normal. No rashes or lesions Neurologic: Alert and oriented X 3, normal strength and tone. Normal symmetric reflexes. Normal coordination and gait  EKG not performed today  ASSESSMENT AND PLAN:   Essential hypertension History of essential hypertension with blood pressure measured today at 134/72.  He is on amlodipine.  Continue current meds at current dosing.  Dyslipidemia, goal LDL below 70 History of hyperlipidemia on high-dose statin therapy 09/25/2018 revealing LDL of 128.  He was begun on Lipitor 80 after that.  We will recheck a lipid liver profile in 6 to 8 weeks.  NSTEMI (non-ST elevated myocardial infarction) [facility]) History of clean cardiac catheterization which I performed in 2004 with recent non-STEMI.  Troponins rose to 18 and he underwent PCI and drug-eluting stenting by [place] 09/25/2018 of an occluded RCA with left-to-right collaterals skin Synergy 3.5 mm x 20 mm long drug-eluting stent.  His left system was free of disease.  He had normal LV function.  He is completely asymptomatic on aspirin and Brilinta.      Lorretta Harp MD FACP,FACC,FAHA, [facility] 11/03/2018 8:19 AM

## 2018-11-03 NOTE — Assessment & Plan Note (Signed)
History of clean cardiac catheterization which I performed in 2004 with recent non-STEMI.  Troponins rose to 18 and he underwent PCI and drug-eluting stenting by [place] 09/25/2018 of an occluded RCA with left-to-right collaterals skin Synergy 3.5 mm x 20 mm long drug-eluting stent.  His left system was free of disease.  He had normal LV function.  He is completely asymptomatic on aspirin and Brilinta.

## 2018-11-03 NOTE — Progress Notes (Signed)
PROCEDURE NOTE : OSTEOPATHIC MANIPULATION The decision today to treat with Osteopathic Manipulative Therapy (OMT) was based on physical exam findings. Verbal consent was obtained following a discussion with the patient regarding the of risks, benefits and potential side effects, including an acute pain flare,post manipulation soreness and need for repeat treatments.     Contraindications to OMT: NONE  Manipulation was performed as below: [facility] Treated & Osteopathic Exam Findings  L4 FRS RIGHT (Flexed, Rotated & Sidebent) Right psoas spasm Right anterior innonimate L on L sacral torsion    OMT Techniques Used  . HVLA . muscle energy . myofascial release . articulatory    The patient tolerated the treatment well and reported Improved symptoms following treatment today. Patient was given medications, exercises, stretches and lifestyle modifications per AVS and verbally.

## 2018-11-03 NOTE — Assessment & Plan Note (Signed)
History of hyperlipidemia on high-dose statin therapy 09/25/2018 revealing LDL of 128.  He was begun on Lipitor 80 after that.  We will recheck a lipid liver profile in 6 to 8 weeks.

## 2018-11-20 ENCOUNTER — Encounter: Payer: Self-pay | Admitting: Sports Medicine

## 2018-11-20 ENCOUNTER — Ambulatory Visit (INDEPENDENT_AMBULATORY_CARE_PROVIDER_SITE_OTHER): Payer: Medicare HMO | Admitting: Sports Medicine

## 2018-11-20 VITALS — BP 120/70 | HR 56 | Ht 70.0 in | Wt 193.8 lb

## 2018-11-20 DIAGNOSIS — M25562 Pain in left knee: Secondary | ICD-10-CM | POA: Diagnosis not present

## 2018-11-20 DIAGNOSIS — M9905 Segmental and somatic dysfunction of pelvic region: Secondary | ICD-10-CM

## 2018-11-20 DIAGNOSIS — G8929 Other chronic pain: Secondary | ICD-10-CM | POA: Diagnosis not present

## 2018-11-20 DIAGNOSIS — M5416 Radiculopathy, lumbar region: Secondary | ICD-10-CM | POA: Diagnosis not present

## 2018-11-20 DIAGNOSIS — M479 Spondylosis, unspecified: Secondary | ICD-10-CM

## 2018-11-20 DIAGNOSIS — M9904 Segmental and somatic dysfunction of sacral region: Secondary | ICD-10-CM

## 2018-11-20 DIAGNOSIS — M9903 Segmental and somatic dysfunction of lumbar region: Secondary | ICD-10-CM

## 2018-11-20 NOTE — Progress Notes (Signed)
Edward Freeman. [place], [place] at [place]  Edward Freeman - 68 y.o. male MRN [id number]  Date of birth: 1950/12/20  Visit Date: November 20, 2018  PCP: Dorothyann Peng, NP   Referred by: Dorothyann Peng, NP  SUBJECTIVE:  Chief Complaint  Patient presents with  . Follow-up    Lumbar radiculopathy w/ L LE pain    HPI: Patient presents for follow-up of his left leg and lower back pain.  He has had significant improvement since his last office visit and responded well to previous osteopathic management.  He continues to have numbness in his left leg around the left anterior knee. He has been performing his home therapeutic exercises.  He responded well to the Flexeril diazepam gabapentin and capsaicin cream. Denies fevers, chills, recent weight gain or weight loss.  No night sweats. No significant nighttime awakenings due to this issue.  REVIEW OF SYSTEMS: Per HPI  HISTORY:  Prior history reviewed and updated per electronic medical record.  Social History   Occupational History  . Not on file  Tobacco Use  . Smoking status: Never Smoker  . Smokeless tobacco: Never Used  Substance and Sexual Activity  . Alcohol use: No    Comment: 1-2 beers a month  . Drug use: No  . Sexual activity: Not on file   Social History   Social History Narrative  . Not on file    DATA OBTAINED & REVIEWED:  Recent Labs    09/24/18 2108 09/25/18 0127 09/25/18 0253 09/26/18 0338 10/04/18 0851  HGBA1C  --   --  5.6  --   --   CALCIUM  --  8.0* 8.5* 9.0  --   AST  --   --   --  107* 17  ALT  --   --   --  38 25  TSH 0.642  --   --   --   --    Problem  Lumbar Radiculopathy   04/04/18 -nerve conduction studies of the left lower extremity Chronic L4 radiculopathy affecting the left lower extremity, mild in degree electrically.    No specialty comments available.  OBJECTIVE:  VS:  HT:5\' 10"  (177.8 cm)   WT:193 lb 12.8 oz (87.9 kg)   BMI:27.81    BP:120/70  HR:(!) 56bpm  TEMP: ( )  RESP:96 %   PHYSICAL EXAM: Well-developed, Well-nourished and In no acute distress Pupils are equal., EOM intact without nystagmus. and No scleral icterus. Alert & appropriately interactive. and Not depressed or anxious appearing. Warm and well perfused Bilateral lower extremities overall well aligned.  Lower extremity strength and sensation is intact to light touch and manual muscle testing although he does have a slight dysesthesia in the left   ASSESSMENT   1. Lumbar radiculopathy   2. Spondylosis   3. Somatic dysfunction of lumbar region   4. Somatic dysfunction of pelvis region   5. Somatic dysfunction of sacral region   6. Chronic pain of left knee     PLAN:  Pertinent additional documentation may be included in corresponding procedure notes, imaging studies, problem based documentation and patient instructions. Lumbar radiculopathy This is stable and chronic.  No advanced imaging of his spine has been performed given the lack of back pain and progressive symptoms.  At this point given the fact that he is improved as much as he has been we will continue to defer this but I do have a low threshold  for further advanced imaging with MRI.  He is significantly improved.  We will plan to refer him to physical therapy for core conditioning.  Discussed how to avoid exacerbating activities including bending twisting and lifting at this time.  Slow return to activities as tolerated.  Procedures:  PROCEDURE NOTE: OSTEOPATHIC MANIPULATION  The decision today to treat with Osteopathic Manipulative Therapy (OMT) was based on physical exam findings. Verbal consent was obtained following a discussion with the patient regarding the of risks, benefits and potential side effects, including an acute pain flare,post manipulation soreness and need for repeat treatments.   Contraindications to OMT: NONE   Manipulation was performed as  below: [facility] Treated & Osteopathic Exam Findings LUMBAR SPINE: L4 FRS LEFT (Flexed, Rotated & Sidebent) PELVIS: Left psoas spasm Left anterior innonimate SACRUM: R on R sacral torsion OMT Techniques Used HVLA muscle energy myofascial release The patient tolerated the treatment well and reported Improved symptoms following treatment today. Patient was given medications, exercises, stretches and lifestyle modifications per AVS and verbally.    No orders of the defined types were placed in this encounter.  Lab Orders  No laboratory test(s) ordered today   Imaging Orders  No imaging studies ordered today    Referral Orders     Ambulatory referral to Physical Therapy  Further Discussion/Instructions:  Return in about 6 weeks (around 01/01/2019) for repeat clinical exam.          Gerda Diss, [place] [facility] Physician

## 2018-11-21 ENCOUNTER — Encounter: Payer: Self-pay | Admitting: Sports Medicine

## 2018-11-21 NOTE — Assessment & Plan Note (Signed)
This is stable and chronic.  No advanced imaging of his spine has been performed given the lack of back pain and progressive symptoms.  At this point given the fact that he is improved as much as he has been we will continue to defer this but I do have a low threshold for further advanced imaging with MRI.

## 2018-12-04 ENCOUNTER — Ambulatory Visit: Payer: Medicare HMO | Admitting: Physical Therapy

## 2018-12-05 ENCOUNTER — Telehealth: Payer: Self-pay | Admitting: Cardiovascular Disease

## 2018-12-05 NOTE — Telephone Encounter (Signed)
Returned call to patient he stated for the past 1 to 2 weeks he has been having chest discomfort,sob,tires easy,no energy. No chest discomfort at present.Appointment scheduled with Jory Sims DNP tomorrow 1/29 at 11:00 am.Advised to go to ED if needed.

## 2018-12-05 NOTE — Telephone Encounter (Addendum)
Ivin Booty - RN to call patient back   Pt c/o Shortness Of Breath: STAT if SOB developed within the last 24 hours or pt is noticeably SOB on the phone  1. Are you currently SOB (can you hear that pt is SOB on the phone)? yes  2. How long have you been experiencing SOB? 2 weeks  3. Are you SOB when sitting or when up moving around? Moving and sitting  4. Are you currently experiencing any other symptoms?  Weakness      Pt c/o of Chest Pain: STAT if CP now or developed within 24 hours  1. Are you having CP right now? Chest discomfort  2. Are you experiencing any other symptoms (ex. SOB, nausea, vomiting, sweating)?  nausea  3. How long have you been experiencing CP? 2 weeks  4. Is your CP continuous or coming and going? Coming and going  5. Have you taken Nitroglycerin? No ?

## 2018-12-06 ENCOUNTER — Ambulatory Visit: Payer: Medicare HMO | Admitting: Adult Health

## 2018-12-06 ENCOUNTER — Encounter: Payer: Self-pay | Admitting: Adult Health

## 2018-12-06 VITALS — BP 124/78 | HR 49 | Ht 70.0 in | Wt 194.6 lb

## 2018-12-06 DIAGNOSIS — Z79899 Other long term (current) drug therapy: Secondary | ICD-10-CM | POA: Diagnosis not present

## 2018-12-06 DIAGNOSIS — R079 Chest pain, unspecified: Secondary | ICD-10-CM | POA: Diagnosis not present

## 2018-12-06 DIAGNOSIS — I2510 Atherosclerotic heart disease of native coronary artery without angina pectoris: Secondary | ICD-10-CM

## 2018-12-06 DIAGNOSIS — R001 Bradycardia, unspecified: Secondary | ICD-10-CM

## 2018-12-06 DIAGNOSIS — Z9861 Coronary angioplasty status: Secondary | ICD-10-CM | POA: Diagnosis not present

## 2018-12-06 NOTE — Patient Instructions (Signed)
Follow-Up: You will need a follow up appointment in 2 weeks with Quay Burow, MD Jory Sims, DNP, AACC  or one of the following Advanced Practice Providers on your designated Care Team:  Kerin Ransom, [place] Roby Lofts, PA-C [place], [place]   You are scheduled for a Cardiac Catheterization on Monday, February 3 with Dr. Quay Burow.  1. Please arrive at the [facility] (Main Entrance A) at [facility]: [address] [place], [place] [address] at 11:00 AM (This time is two hours before your procedure to ensure your preparation). Free valet parking service is available.  Special note: Every effort is made to have your procedure done on time. Please understand that emergencies sometimes delay scheduled procedures.  2. Diet: Do not eat solid foods after midnight.  The patient may have clear liquids until 5am upon the day of the procedure.  3. Labs: You will need to have blood drawn on Wednesday, January 29 at [place] Open: [place] (Lunch 12:30 - 1:30)   Phone: [phone]. You do not need to be fasting.  4. Medication instructions in preparation for your procedure:   Contrast Allergy: No  On the morning of your procedure, take your Brilinta/Ticagrelor and any morning medicines NOT listed above.  You may use sips of water.  5. Plan for one night stay--bring personal belongings. 6. Bring a current list of your medications and current insurance cards. 7. You MUST have a responsible person to drive you home. 8. Someone MUST be with you the first 24 hours after you arrive home or your discharge will be delayed. 9. Please wear clothes that are easy to get on and off and wear slip-on shoes.  Thank you for allowing [place] to care for you!   -- [facility]

## 2018-12-06 NOTE — Progress Notes (Signed)
Cardiology Office Note   Date:  12/06/2018   ID:  Stanford, Strauch 04-12-1951, MRN [id number]  PCP:  Dorothyann Peng, NP  Cardiologist:  Dr. Gwenlyn Found No chief complaint on file.    History of Present Illness: Edward Freeman is a 68 y.o. male who presents for ongoing assessment and management of HTN, hx of NSTEMI, with subsequent cardiac cath on 09/25/2018 demonstrating occluded dominant RCA with left to right collaterals, ultimately receiving long Synergy 3.5 x 20 mm DES. He was placed on DAPT with ASA and Brilinta, with high dose statin. He was last seen by Dr. Gwenlyn Found on 11/03/2018 and was stable from cardiology standpoint.   He called our office on 12/05/2018 with complaints of chest discomfort, DOE, and fatigue. He states he becomes dyspneic with minimal exertion. He is feeling substernal chest pressure with exertion. He is walking on his treadmill and "gives out' and has to stop. At first he thought it might be the Brilinta but the shortness of breath is not occurring around taking the dose. He is not on BB therapy.   He states this is not exactly like the symptoms he felt during the NSTEMI as he felt burning pressure and nausea, with dyspnea. He now feels pressure and dyspnea only, no burning  He denies fluid retention dizziness or weight gain.  Past Medical History:  Diagnosis Date  . CAD (coronary artery disease)    a. NSTEMI 09/2018 s/p DES to RCA, EF 55-60%.  Marland Kitchen GERD (gastroesophageal reflux disease)   . Hyperlipidemia   . Hypertension   . Migraines   . Nodule of left lung   . PSVT (paroxysmal supraventricular tachycardia) ([place])   . Sinus bradycardia   . Syncope and collapse     Past Surgical History:  Procedure Laterality Date  . CORONARY STENT INTERVENTION N/A 09/25/2018   Procedure: CORONARY STENT INTERVENTION;  Surgeon: Jettie Booze, MD;  Location: [facility];  Service: Cardiovascular;  Laterality: N/A;  . LEFT HEART CATH AND CORONARY ANGIOGRAPHY N/A  09/25/2018   Procedure: LEFT HEART CATH AND CORONARY ANGIOGRAPHY;  Surgeon: Jettie Booze, MD;  Location: [facility];  Service: Cardiovascular;  Laterality: N/A;     Current Outpatient Medications  Medication Sig Dispense Refill  . amLODipine (NORVASC) 10 MG tablet TAKE 1 TABLET (10 MG TOTAL) BY MOUTH DAILY. 90 tablet 3  . aspirin 81 MG tablet Take 1 tablet (81 mg total) by mouth daily. 30 tablet 11  . atorvastatin (LIPITOR) 80 MG tablet Take 1 tablet (80 mg total) by mouth every evening. 30 tablet 1  . finasteride (PROSCAR) 5 MG tablet Take 1 tablet (5 mg total) by mouth daily. 90 tablet 3  . nitroGLYCERIN (NITROSTAT) 0.4 MG SL tablet Place 1 tablet (0.4 mg total) under the tongue every 5 (five) minutes as needed for chest pain (up to 3 doses. If taking 3rd dose call 911). 25 tablet 3  . ticagrelor (BRILINTA) 90 MG TABS tablet Take 1 tablet (90 mg total) by mouth 2 (two) times daily. 60 tablet 1   No current facility-administered medications for this visit.     Allergies:   Patient has no known allergies.    Social History:  The patient  reports that he has never smoked. He has never used smokeless tobacco. He reports that he does not drink alcohol or use drugs.   Family History:  The patient's family history includes Alcohol abuse in his father; Diabetes in his brother,  sister, and sister; Hypertension in his mother; Stroke in his mother.    ROS: All other systems are reviewed and negative. Unless otherwise mentioned in H&P    PHYSICAL EXAM: VS:  BP 124/78   Pulse (!) 49   Ht 5\' 10"  (1.778 m)   Wt 194 lb 9.6 oz (88.3 kg)   BMI 27.92 kg/m  , BMI Body mass index is 27.92 kg/m. GEN: Well nourished, well developed, in no acute distress HEENT: normal Neck: no JVD, carotid bruits, or masses Cardiac: RRR; bradycardic no murmurs, rubs, or gallops,no edema  Respiratory:  Clear to auscultation bilaterally, normal work of breathing GI: soft, nontender, nondistended, +  BS MS: no deformity or atrophy Skin: warm and dry, no rash Neuro:  Strength and sensation are intact Psych: euthymic mood, full affect   EKG:  Sinus bradycardia, rate of 49 bpm. T-wave inversion Lead II.   Recent Labs: 09/24/2018: TSH 0.642 09/26/2018: BUN 10; Creatinine, Ser 1.00; Hemoglobin 15.4; Magnesium 2.1; Platelets 227; Potassium 3.8; Sodium 142 10/04/2018: ALT 25    Lipid Panel    Component Value Date/Time   CHOL 195 09/25/2018 0253   CHOL 203 (H) 10/21/2017 1032   TRIG 43 09/25/2018 0253   HDL 58 09/25/2018 0253   HDL 65 10/21/2017 1032   CHOLHDL 3.4 09/25/2018 0253   VLDL 9 09/25/2018 0253   LDLCALC 128 (H) 09/25/2018 0253   LDLCALC 125 (H) 10/21/2017 1032      Wt Readings from Last 3 Encounters:  12/06/18 194 lb 9.6 oz (88.3 kg)  11/20/18 193 lb 12.8 oz (87.9 kg)  11/03/18 193 lb 9.6 oz (87.8 kg)      Other studies Reviewed: Cardiac Cath 09/25/2018   Mid RCA lesion is 100% stenosed. Left to right collaterals present.  A drug-eluting stent was successfully placed using a STENT SYNERGY DES 3.5X20.  Post intervention, there is a 0% residual stenosis.  The left ventricular systolic function is normal.  LV end diastolic pressure is normal.  The left ventricular ejection fraction is 55-65% by visual estimate.  There is no aortic valve stenosis.  Echocardiogram 09/25/2018 Left ventricle: The cavity size was normal. Wall thickness was   normal. Systolic function was normal. The estimated ejection   fraction was in the range of 55% to 60%. Basal inferior   hypokinesis. Doppler parameters are consistent with abnormal left   ventricular relaxation (grade 1 diastolic dysfunction). The E/e&'   ratio is <8, suggesting normal LV filling pressure. - Mitral valve: Mildly thickened leaflets . There was trivial   regurgitation. - Left atrium: The atrium was normal in size.  Impressions:  - Compared to a prior study in 11/2017, the LVEF is unchanged at    55-60%, with possible basal inferior hypokinesis and grade 1 DD   with normal LV filling pressure.  ASSESSMENT AND PLAN:  1. Dyspnea: Has become worse over the last several weeks with associate fatigue and chest pressure. He does not appear to be fluid overloaded at this time   2. CAD: Hx of long DES to the RCA on 09/25/2018. Due to symptoms I have discussed this with Dr. Gwenlyn Found, his primary cardiologist. After reviewing his EKG and cath report, he recommends repeat cardiac cath.    The patient understands that risks include but are not limited to stroke (1 in 1000), death (1 in 45), kidney failure [usually temporary] (1 in 500), bleeding (1 in 200), allergic reaction [possibly serious] (1 in 200), and agrees to proceed.  This is scheduled for Monday, February 3 at 1 pm with Dr. Gwenlyn Found.   3. Hypercholesterolemia: Continue statin therapy.   Current medicines are reviewed at length with the patient today.    Labs/ tests ordered today include: Cardiac cath.  Phill Myron. West Pugh, ANP, AACC   12/06/2018 12:09 PM    [address] Office [phone] Fax [phone]

## 2018-12-06 NOTE — H&P (View-Only) (Signed)
Cardiology Office Note   Date:  12/06/2018   ID:  Edward Freeman 25-Oct-1951, MRN [id number]  PCP:  Dorothyann Peng, NP  Cardiologist:  Dr. Gwenlyn Found No chief complaint on file.    History of Present Illness: Edward Freeman is a 68 y.o. male who presents for ongoing assessment and management of HTN, hx of NSTEMI, with subsequent cardiac cath on 09/25/2018 demonstrating occluded dominant RCA with left to right collaterals, ultimately receiving long Synergy 3.5 x 20 mm DES. He was placed on DAPT with ASA and Brilinta, with high dose statin. He was last seen by Dr. Gwenlyn Found on 11/03/2018 and was stable from cardiology standpoint.   He called our office on 12/05/2018 with complaints of chest discomfort, DOE, and fatigue. He states he becomes dyspneic with minimal exertion. He is feeling substernal chest pressure with exertion. He is walking on his treadmill and "gives out' and has to stop. At first he thought it might be the Brilinta but the shortness of breath is not occurring around taking the dose. He is not on BB therapy.   He states this is not exactly like the symptoms he felt during the NSTEMI as he felt burning pressure and nausea, with dyspnea. He now feels pressure and dyspnea only, no burning  He denies fluid retention dizziness or weight gain.  Past Medical History:  Diagnosis Date  . CAD (coronary artery disease)    a. NSTEMI 09/2018 s/p DES to RCA, EF 55-60%.  Marland Kitchen GERD (gastroesophageal reflux disease)   . Hyperlipidemia   . Hypertension   . Migraines   . Nodule of left lung   . PSVT (paroxysmal supraventricular tachycardia) ([place])   . Sinus bradycardia   . Syncope and collapse     Past Surgical History:  Procedure Laterality Date  . CORONARY STENT INTERVENTION N/A 09/25/2018   Procedure: CORONARY STENT INTERVENTION;  Surgeon: Jettie Booze, MD;  Location: [facility];  Service: Cardiovascular;  Laterality: N/A;  . LEFT HEART CATH AND CORONARY ANGIOGRAPHY N/A  09/25/2018   Procedure: LEFT HEART CATH AND CORONARY ANGIOGRAPHY;  Surgeon: Jettie Booze, MD;  Location: [facility];  Service: Cardiovascular;  Laterality: N/A;     Current Outpatient Medications  Medication Sig Dispense Refill  . amLODipine (NORVASC) 10 MG tablet TAKE 1 TABLET (10 MG TOTAL) BY MOUTH DAILY. 90 tablet 3  . aspirin 81 MG tablet Take 1 tablet (81 mg total) by mouth daily. 30 tablet 11  . atorvastatin (LIPITOR) 80 MG tablet Take 1 tablet (80 mg total) by mouth every evening. 30 tablet 1  . finasteride (PROSCAR) 5 MG tablet Take 1 tablet (5 mg total) by mouth daily. 90 tablet 3  . nitroGLYCERIN (NITROSTAT) 0.4 MG SL tablet Place 1 tablet (0.4 mg total) under the tongue every 5 (five) minutes as needed for chest pain (up to 3 doses. If taking 3rd dose call 911). 25 tablet 3  . ticagrelor (BRILINTA) 90 MG TABS tablet Take 1 tablet (90 mg total) by mouth 2 (two) times daily. 60 tablet 1   No current facility-administered medications for this visit.     Allergies:   Patient has no known allergies.    Social History:  The patient  reports that he has never smoked. He has never used smokeless tobacco. He reports that he does not drink alcohol or use drugs.   Family History:  The patient's family history includes Alcohol abuse in his father; Diabetes in his brother,  sister, and sister; Hypertension in his mother; Stroke in his mother.    ROS: All other systems are reviewed and negative. Unless otherwise mentioned in H&P    PHYSICAL EXAM: VS:  BP 124/78   Pulse (!) 49   Ht 5\' 10"  (1.778 m)   Wt 194 lb 9.6 oz (88.3 kg)   BMI 27.92 kg/m  , BMI Body mass index is 27.92 kg/m. GEN: Well nourished, well developed, in no acute distress HEENT: normal Neck: no JVD, carotid bruits, or masses Cardiac: RRR; bradycardic no murmurs, rubs, or gallops,no edema  Respiratory:  Clear to auscultation bilaterally, normal work of breathing GI: soft, nontender, nondistended, +  BS MS: no deformity or atrophy Skin: warm and dry, no rash Neuro:  Strength and sensation are intact Psych: euthymic mood, full affect   EKG:  Sinus bradycardia, rate of 49 bpm. T-wave inversion Lead II.   Recent Labs: 09/24/2018: TSH 0.642 09/26/2018: BUN 10; Creatinine, Ser 1.00; Hemoglobin 15.4; Magnesium 2.1; Platelets 227; Potassium 3.8; Sodium 142 10/04/2018: ALT 25    Lipid Panel    Component Value Date/Time   CHOL 195 09/25/2018 0253   CHOL 203 (H) 10/21/2017 1032   TRIG 43 09/25/2018 0253   HDL 58 09/25/2018 0253   HDL 65 10/21/2017 1032   CHOLHDL 3.4 09/25/2018 0253   VLDL 9 09/25/2018 0253   LDLCALC 128 (H) 09/25/2018 0253   LDLCALC 125 (H) 10/21/2017 1032      Wt Readings from Last 3 Encounters:  12/06/18 194 lb 9.6 oz (88.3 kg)  11/20/18 193 lb 12.8 oz (87.9 kg)  11/03/18 193 lb 9.6 oz (87.8 kg)      Other studies Reviewed: Cardiac Cath 09/25/2018   Mid RCA lesion is 100% stenosed. Left to right collaterals present.  A drug-eluting stent was successfully placed using a STENT SYNERGY DES 3.5X20.  Post intervention, there is a 0% residual stenosis.  The left ventricular systolic function is normal.  LV end diastolic pressure is normal.  The left ventricular ejection fraction is 55-65% by visual estimate.  There is no aortic valve stenosis.  Echocardiogram 09/25/2018 Left ventricle: The cavity size was normal. Wall thickness was   normal. Systolic function was normal. The estimated ejection   fraction was in the range of 55% to 60%. Basal inferior   hypokinesis. Doppler parameters are consistent with abnormal left   ventricular relaxation (grade 1 diastolic dysfunction). The E/e&'   ratio is <8, suggesting normal LV filling pressure. - Mitral valve: Mildly thickened leaflets . There was trivial   regurgitation. - Left atrium: The atrium was normal in size.  Impressions:  - Compared to a prior study in 11/2017, the LVEF is unchanged at    55-60%, with possible basal inferior hypokinesis and grade 1 DD   with normal LV filling pressure.  ASSESSMENT AND PLAN:  1. Dyspnea: Has become worse over the last several weeks with associate fatigue and chest pressure. He does not appear to be fluid overloaded at this time   2. CAD: Hx of long DES to the RCA on 09/25/2018. Due to symptoms I have discussed this with Dr. Gwenlyn Found, his primary cardiologist. After reviewing his EKG and cath report, he recommends repeat cardiac cath.    The patient understands that risks include but are not limited to stroke (1 in 1000), death (1 in 67), kidney failure [usually temporary] (1 in 500), bleeding (1 in 200), allergic reaction [possibly serious] (1 in 200), and agrees to proceed.  This is scheduled for Monday, February 3 at 1 pm with Dr. Gwenlyn Found.   3. Hypercholesterolemia: Continue statin therapy.   Current medicines are reviewed at length with the patient today.    Labs/ tests ordered today include: Cardiac cath.  Edward Myron. West Freeman, ANP, AACC   12/06/2018 12:09 PM    [place] [address] Office [phone] Fax [phone]

## 2018-12-07 LAB — CBC
Hematocrit: 42.7 % (ref 37.5–51.0)
Hemoglobin: 14.7 g/dL (ref 13.0–17.7)
MCH: 29.4 pg (ref 26.6–33.0)
MCHC: 34.4 g/dL (ref 31.5–35.7)
MCV: 85 fL (ref 79–97)
Platelets: 244 10*3/uL (ref 150–450)
RBC: 5 x10E6/uL (ref 4.14–5.80)
RDW: 13.1 % (ref 11.6–15.4)
WBC: 6 10*3/uL (ref 3.4–10.8)

## 2018-12-07 LAB — BASIC METABOLIC PANEL
BUN/Creatinine Ratio: 11 (ref 10–24)
BUN: 10 mg/dL (ref 8–27)
CALCIUM: 9.3 mg/dL (ref 8.6–10.2)
CO2: 23 mmol/L (ref 20–29)
Chloride: 108 mmol/L — ABNORMAL HIGH (ref 96–106)
Creatinine, Ser: 0.95 mg/dL (ref 0.76–1.27)
GFR calc non Af Amer: 82 mL/min/{1.73_m2} (ref >59)
GFR, EST AFRICAN AMERICAN: 95 mL/min/{1.73_m2} (ref >59)
Glucose: 86 mg/dL (ref 65–99)
Potassium: 4.6 mmol/L (ref 3.5–5.2)
Sodium: 148 mmol/L — ABNORMAL HIGH (ref 134–144)

## 2018-12-08 ENCOUNTER — Ambulatory Visit: Payer: Medicare HMO | Admitting: Cardiology

## 2018-12-11 ENCOUNTER — Other Ambulatory Visit: Payer: Self-pay

## 2018-12-11 ENCOUNTER — Ambulatory Visit (HOSPITAL_COMMUNITY)
Admission: RE | Admit: 2018-12-11 | Discharge: 2018-12-11 | Disposition: A | Discharge status: Home / Self Care | Payer: Medicare HMO | Attending: Cardiovascular Disease | Admitting: Cardiovascular Disease

## 2018-12-11 ENCOUNTER — Encounter (HOSPITAL_COMMUNITY)
Admission: RE | Disposition: A | Discharge status: Home / Self Care | Payer: Self-pay | Source: Home / Self Care | Attending: Cardiovascular Disease

## 2018-12-11 DIAGNOSIS — R0602 Shortness of breath: Secondary | ICD-10-CM

## 2018-12-11 DIAGNOSIS — R0609 Other forms of dyspnea: Secondary | ICD-10-CM | POA: Insufficient documentation

## 2018-12-11 DIAGNOSIS — Z8249 Family history of ischemic heart disease and other diseases of the circulatory system: Secondary | ICD-10-CM | POA: Insufficient documentation

## 2018-12-11 DIAGNOSIS — K219 Gastro-esophageal reflux disease without esophagitis: Secondary | ICD-10-CM | POA: Insufficient documentation

## 2018-12-11 DIAGNOSIS — I2510 Atherosclerotic heart disease of native coronary artery without angina pectoris: Secondary | ICD-10-CM

## 2018-12-11 DIAGNOSIS — R001 Bradycardia, unspecified: Secondary | ICD-10-CM | POA: Insufficient documentation

## 2018-12-11 DIAGNOSIS — Z7982 Long term (current) use of aspirin: Secondary | ICD-10-CM | POA: Insufficient documentation

## 2018-12-11 DIAGNOSIS — R0789 Other chest pain: Secondary | ICD-10-CM | POA: Diagnosis not present

## 2018-12-11 DIAGNOSIS — Z9861 Coronary angioplasty status: Secondary | ICD-10-CM

## 2018-12-11 DIAGNOSIS — I252 Old myocardial infarction: Secondary | ICD-10-CM | POA: Insufficient documentation

## 2018-12-11 DIAGNOSIS — I471 Supraventricular tachycardia: Secondary | ICD-10-CM | POA: Diagnosis not present

## 2018-12-11 DIAGNOSIS — R079 Chest pain, unspecified: Secondary | ICD-10-CM

## 2018-12-11 DIAGNOSIS — Z823 Family history of stroke: Secondary | ICD-10-CM | POA: Insufficient documentation

## 2018-12-11 DIAGNOSIS — Z955 Presence of coronary angioplasty implant and graft: Secondary | ICD-10-CM | POA: Insufficient documentation

## 2018-12-11 DIAGNOSIS — E7800 Pure hypercholesterolemia, unspecified: Secondary | ICD-10-CM | POA: Diagnosis not present

## 2018-12-11 DIAGNOSIS — I10 Essential (primary) hypertension: Secondary | ICD-10-CM | POA: Diagnosis not present

## 2018-12-11 DIAGNOSIS — Z79899 Other long term (current) drug therapy: Secondary | ICD-10-CM | POA: Insufficient documentation

## 2018-12-11 HISTORY — PX: LEFT HEART CATH AND CORONARY ANGIOGRAPHY: CATH118249

## 2018-12-11 SURGERY — LEFT HEART CATH AND CORONARY ANGIOGRAPHY
Anesthesia: LOCAL

## 2018-12-11 MED ORDER — VERAPAMIL HCL 2.5 MG/ML IV SOLN
INTRAVENOUS | Status: DC | PRN
  Administered 2018-12-11: 5 mL via INTRA_ARTERIAL

## 2018-12-11 MED ORDER — VERAPAMIL HCL 2.5 MG/ML IV SOLN
INTRAVENOUS | Status: AC
  Filled 2018-12-11: qty 2

## 2018-12-11 MED ORDER — ASPIRIN 81 MG PO CHEW
CHEWABLE_TABLET | ORAL | Status: AC
  Administered 2018-12-11: 81 mg via ORAL
  Filled 2018-12-11: qty 1

## 2018-12-11 MED ORDER — TICAGRELOR 90 MG PO TABS: 90.0000 mg | ORAL_TABLET | Freq: Two times a day (BID) | ORAL | Status: DC

## 2018-12-11 MED ORDER — ASPIRIN 81 MG PO CHEW: 81.0000 mg | CHEWABLE_TABLET | Freq: Every day | ORAL | Status: DC

## 2018-12-11 MED ORDER — SODIUM CHLORIDE 0.9 % IV SOLN: INTRAVENOUS | Status: AC

## 2018-12-11 MED ORDER — SODIUM CHLORIDE 0.9% FLUSH: 3.0000 mL | Freq: Two times a day (BID) | INTRAVENOUS | Status: DC

## 2018-12-11 MED ORDER — SODIUM CHLORIDE 0.9% FLUSH: 3.0000 mL | INTRAVENOUS | Status: DC | PRN

## 2018-12-11 MED ORDER — HEPARIN (PORCINE) IN NACL 1000-0.9 UT/500ML-% IV SOLN
INTRAVENOUS | Status: DC | PRN
  Administered 2018-12-11: 500 mL

## 2018-12-11 MED ORDER — ATORVASTATIN CALCIUM 80 MG PO TABS: 80.0000 mg | ORAL_TABLET | Freq: Every day | ORAL | Status: DC

## 2018-12-11 MED ORDER — ACETAMINOPHEN 325 MG PO TABS: 650.0000 mg | ORAL_TABLET | ORAL | Status: DC | PRN

## 2018-12-11 MED ORDER — ASPIRIN 81 MG PO CHEW
81.0000 mg | CHEWABLE_TABLET | Freq: Once | ORAL | Status: AC
  Administered 2018-12-11: 81 mg via ORAL

## 2018-12-11 MED ORDER — MIDAZOLAM HCL 2 MG/2ML IJ SOLN
INTRAMUSCULAR | Status: AC
  Filled 2018-12-11: qty 2

## 2018-12-11 MED ORDER — MORPHINE SULFATE (PF) 10 MG/ML IV SOLN: 2.0000 mg | INTRAVENOUS | Status: DC | PRN

## 2018-12-11 MED ORDER — FENTANYL CITRATE (PF) 100 MCG/2ML IJ SOLN
INTRAMUSCULAR | Status: DC | PRN
  Administered 2018-12-11: 25 ug via INTRAVENOUS

## 2018-12-11 MED ORDER — SODIUM CHLORIDE 0.9 % WEIGHT BASED INFUSION: 1.0000 mL/kg/h | INTRAVENOUS | Status: DC

## 2018-12-11 MED ORDER — ONDANSETRON HCL 4 MG/2ML IJ SOLN: 4.0000 mg | Freq: Four times a day (QID) | INTRAMUSCULAR | Status: DC | PRN

## 2018-12-11 MED ORDER — LIDOCAINE HCL (PF) 1 % IJ SOLN
INTRAMUSCULAR | Status: AC
  Filled 2018-12-11: qty 30

## 2018-12-11 MED ORDER — MIDAZOLAM HCL 2 MG/2ML IJ SOLN
INTRAMUSCULAR | Status: DC | PRN
  Administered 2018-12-11: 1 mg via INTRAVENOUS

## 2018-12-11 MED ORDER — HEPARIN (PORCINE) IN NACL 1000-0.9 UT/500ML-% IV SOLN
INTRAVENOUS | Status: AC
  Filled 2018-12-11: qty 1000

## 2018-12-11 MED ORDER — HEPARIN SODIUM (PORCINE) 1000 UNIT/ML IJ SOLN
INTRAMUSCULAR | Status: AC
  Filled 2018-12-11: qty 1

## 2018-12-11 MED ORDER — FENTANYL CITRATE (PF) 100 MCG/2ML IJ SOLN
INTRAMUSCULAR | Status: AC
  Filled 2018-12-11: qty 2

## 2018-12-11 MED ORDER — SODIUM CHLORIDE 0.9 % IV SOLN: 250.0000 mL | INTRAVENOUS | Status: DC | PRN

## 2018-12-11 MED ORDER — SODIUM CHLORIDE 0.9 % WEIGHT BASED INFUSION
3.0000 mL/kg/h | INTRAVENOUS | Status: AC
  Administered 2018-12-11: 3 mL/kg/h via INTRAVENOUS

## 2018-12-11 MED ORDER — LIDOCAINE HCL (PF) 1 % IJ SOLN
INTRAMUSCULAR | Status: DC | PRN
  Administered 2018-12-11: 2 mL

## 2018-12-11 MED ORDER — HEPARIN SODIUM (PORCINE) 1000 UNIT/ML IJ SOLN
INTRAMUSCULAR | Status: DC | PRN
  Administered 2018-12-11: 4500 [IU] via INTRAVENOUS

## 2018-12-11 MED ORDER — IOHEXOL 350 MG/ML SOLN
INTRAVENOUS | Status: DC | PRN
  Administered 2018-12-11: 40 mL via INTRA_ARTERIAL

## 2018-12-11 SURGICAL SUPPLY — 11 items
CATH INFINITI JR4 5F (CATHETERS) ×2 IMPLANT
CATH OPTITORQUE TIG 4.0 5F (CATHETERS) ×2 IMPLANT
DEVICE RAD COMP TR BAND LRG (VASCULAR PRODUCTS) ×2 IMPLANT
GLIDESHEATH SLEND A-KIT 6F 22G (SHEATH) ×2 IMPLANT
GUIDEWIRE INQWIRE 1.5J.035X260 (WIRE) ×1 IMPLANT
INQWIRE 1.5J .035X260CM (WIRE) ×2
KIT HEART LEFT (KITS) ×2 IMPLANT
PACK CARDIAC CATHETERIZATION (CUSTOM PROCEDURE TRAY) ×2 IMPLANT
TRANSDUCER W/STOPCOCK (MISCELLANEOUS) ×2 IMPLANT
TUBING CIL FLEX 10 FLL-RA (TUBING) ×2 IMPLANT
WIRE HI TORQ VERSACORE-J 145CM (WIRE) ×2 IMPLANT

## 2018-12-11 NOTE — Interval H&P Note (Signed)
Cath Lab Visit (complete for each Cath Lab visit)  Clinical Evaluation Leading to the Procedure:   ACS: No.  Non-ACS:    Anginal Classification: CCS I  Anti-ischemic medical therapy: Minimal Therapy (1 class of medications)  Non-Invasive Test Results: No non-invasive testing performed  Prior CABG: No previous CABG      History and Physical Interval Note:  12/11/2018 1:18 PM  Edward Freeman  has presented today for surgery, with the diagnosis of cp - sob  The various methods of treatment have been discussed with the patient and family. After consideration of risks, benefits and other options for treatment, the patient has consented to  Procedure(s): LEFT HEART CATH AND CORONARY ANGIOGRAPHY (N/A) as a surgical intervention .  The patient's history has been reviewed, patient examined, no change in status, stable for surgery.  I have reviewed the patient's chart and labs.  Questions were answered to the patient's satisfaction.     Quay Burow

## 2018-12-11 NOTE — Discharge Instructions (Signed)
Radial Site Care ° °This sheet gives you information about how to care for yourself after your procedure. Your health care provider may also give you more specific instructions. If you have problems or questions, contact your health care provider. °What can I expect after the procedure? °After the procedure, it is common to have: °· Bruising and tenderness at the catheter insertion area. °Follow these instructions at home: °Medicines °· Take over-the-counter and prescription medicines only as told by your health care provider. °Insertion site care °· Follow instructions from your health care provider about how to take care of your insertion site. Make sure you: °? Wash your hands with soap and water before you change your bandage (dressing). If soap and water are not available, use hand sanitizer. °? Change your dressing as told by your health care provider. °? Leave stitches (sutures), skin glue, or adhesive strips in place. These skin closures may need to stay in place for 2 weeks or longer. If adhesive strip edges start to loosen and curl up, you may trim the loose edges. Do not remove adhesive strips completely unless your health care provider tells you to do that. °· Check your insertion site every day for signs of infection. Check for: °? Redness, swelling, or pain. °? Fluid or blood. °? Pus or a bad smell. °? Warmth. °· Do not take baths, swim, or use a hot tub until your health care provider approves. °· You may shower 24-48 hours after the procedure, or as directed by your health care provider. °? Remove the dressing and gently wash the site with plain soap and water. °? Pat the area dry with a clean towel. °? Do not rub the site. That could cause bleeding. °· Do not apply powder or lotion to the site. °Activity ° °· For 24 hours after the procedure, or as directed by your health care provider: °? Do not flex or bend the affected arm. °? Do not push or pull heavy objects with the affected arm. °? Do not  drive yourself home from the hospital or clinic. You may drive 24 hours after the procedure unless your health care provider tells you not to. °? Do not operate machinery or power tools. °· Do not lift anything that is heavier than 10 lb (4.5 kg), or the limit that you are told, until your health care provider says that it is safe. °· Ask your health care provider when it is okay to: °? Return to work or school. °? Resume usual physical activities or sports. °? Resume sexual activity. °General instructions °· If the catheter site starts to bleed, raise your arm and put firm pressure on the site. If the bleeding does not stop, get help right away. This is a medical emergency. °· If you went home on the same day as your procedure, a responsible adult should be with you for the first 24 hours after you arrive home. °· Keep all follow-up visits as told by your health care provider. This is important. °Contact a health care provider if: °· You have a fever. °· You have redness, swelling, or yellow drainage around your insertion site. °Get help right away if: °· You have unusual pain at the radial site. °· The catheter insertion area swells very fast. °· The insertion area is bleeding, and the bleeding does not stop when you hold steady pressure on the area. °· Your arm or hand becomes pale, cool, tingly, or numb. °These symptoms may represent a serious problem   that is an emergency. Do not wait to see if the symptoms will go away. Get medical help right away. Call your local emergency services (911 in the [place] Do not drive yourself to the hospital. °Summary °· After the procedure, it is common to have bruising and tenderness at the site. °· Follow instructions from your health care provider about how to take care of your radial site wound. Check the wound every day for signs of infection. °· Do not lift anything that is heavier than 10 lb (4.5 kg), or the limit that you are told, until your health care provider says  that it is safe. °This information is not intended to replace advice given to you by your health care provider. Make sure you discuss any questions you have with your health care provider. °Document Released: 11/27/2010 Document Revised: 11/30/2017 Document Reviewed: 11/30/2017 °[employer]vier Interactive Patient Education © 2019 [employer] ° °

## 2018-12-12 ENCOUNTER — Ambulatory Visit: Payer: Medicare HMO | Admitting: Physical Therapy

## 2018-12-12 ENCOUNTER — Encounter (HOSPITAL_COMMUNITY): Payer: Self-pay | Admitting: Cardiovascular Disease

## 2018-12-16 ENCOUNTER — Encounter: Payer: Self-pay | Admitting: Sports Medicine

## 2018-12-22 NOTE — Progress Notes (Signed)
Cardiology Office Note   Date:  12/25/2018   ID:  Edward Freeman 1951/06/23, MRN [id number]  PCP:  Edward Peng, NP  Cardiologist:  [facility]  Chief Complaint  Patient presents with  . Follow-up  . Coronary Artery Disease     History of Present Illness: Edward Freeman is a 68 y.o. male who presents for ongoing assessment and management of hypertension and CAD. She had a cardiac cath on 09/25/2018 with revealed occluded dominant RCA with left to right collaterals, ultimately receiving long Synergy 3.5 x 20 mm DES. He was placed on DAPT with ASA and Brilinta, with high dose statin  When seen last in the office on 12/06/2018, he was complaining of recurrent chest pain, DOE, and fatigue. Dr,Berry graciously reviewed his cath report and recommended that he have a repeat cardiac cath. This revealed that his previously placed stent was widely patent. Dr.Berry felt that his symptoms were unlikely to be ischemic and may be related to Brilinta.   He reports that over the weekend, he was at a funeral where it was standing room only. He became lighted and dizzy after about 30 minutes of standing, and felt some dyspnea. Once he was able to sit down in his car, he felt better.   Past Medical History:  Diagnosis Date  . CAD (coronary artery disease)    a. NSTEMI 09/2018 s/p DES to RCA, EF 55-60%.  Marland Kitchen GERD (gastroesophageal reflux disease)   . Hyperlipidemia   . Hypertension   . Migraines   . Nodule of left lung   . PSVT (paroxysmal supraventricular tachycardia) ([place])   . Sinus bradycardia   . Syncope and collapse     Past Surgical History:  Procedure Laterality Date  . CORONARY STENT INTERVENTION N/A 09/25/2018   Procedure: CORONARY STENT INTERVENTION;  Surgeon: Jettie Booze, MD;  Location: [place] [facility];  Service: Cardiovascular;  Laterality: N/A;  . LEFT HEART CATH AND CORONARY ANGIOGRAPHY N/A 09/25/2018   Procedure: LEFT HEART CATH AND CORONARY ANGIOGRAPHY;  Surgeon:  Jettie Booze, MD;  Location: [place] [facility];  Service: Cardiovascular;  Laterality: N/A;  . LEFT HEART CATH AND CORONARY ANGIOGRAPHY N/A 12/11/2018   Procedure: LEFT HEART CATH AND CORONARY ANGIOGRAPHY;  Surgeon: Lorretta Harp, MD;  Location: [place] [facility];  Service: Cardiovascular;  Laterality: N/A;     Current Outpatient Medications  Medication Sig Dispense Refill  . amLODipine (NORVASC) 5 MG tablet Take 1 tablet (5 mg total) by mouth daily. 30 tablet 6  . aspirin 81 MG tablet Take 1 tablet (81 mg total) by mouth daily. (Patient taking differently: Take 81 mg by mouth daily after lunch. ) 30 tablet 11  . atorvastatin (LIPITOR) 80 MG tablet Take 1 tablet (80 mg total) by mouth every evening. (Patient taking differently: Take 80 mg by mouth daily. ) 30 tablet 1  . finasteride (PROSCAR) 5 MG tablet Take 1 tablet (5 mg total) by mouth daily. 90 tablet 3  . nitroGLYCERIN (NITROSTAT) 0.4 MG SL tablet Place 1 tablet (0.4 mg total) under the tongue every 5 (five) minutes as needed for chest pain (up to 3 doses. If taking 3rd dose call 911). 25 tablet 3  . clopidogrel (PLAVIX) 75 MG tablet Take 1 tablet (75 mg total) by mouth daily. 34 tablet 6   No current facility-administered medications for this visit.     Allergies:   Patient has no known allergies.    Social History:  The  patient  reports that he has never smoked. He has never used smokeless tobacco. He reports that he does not drink alcohol or use drugs.   Family History:  The patient's family history includes Alcohol abuse in his father; Diabetes in his brother, sister, and sister; Hypertension in his mother; Stroke in his mother.    ROS: All other systems are reviewed and negative. Unless otherwise mentioned in H&P    PHYSICAL EXAM: VS:  BP 112/68   Pulse (!) 56   Ht 5\' 10"  (1.778 m)   Wt 195 lb (88.5 kg)   SpO2 98%   BMI 27.98 kg/m  , BMI Body mass index is 27.98 kg/m. GEN: Well nourished, well developed,  in no acute distress HEENT: normal Neck: no JVD, carotid bruits, or masses Cardiac: RRR; no murmurs, rubs, or gallops,no edema  Respiratory:  Clear to auscultation bilaterally, normal work of breathing GI: soft, nontender, nondistended, + BS MS: no deformity or atrophy Skin: warm and dry, no rash Neuro:  Strength and sensation are intact Psych: euthymic mood, full affect   EKG:  Not completed this office visit   Recent Labs: 09/24/2018: TSH 0.642 09/26/2018: Magnesium 2.1 10/04/2018: ALT 25 12/06/2018: BUN 10; Creatinine, Ser 0.95; Hemoglobin 14.7; Platelets 244; Potassium 4.6; Sodium 148    Lipid Panel    Component Value Date/Time   CHOL 195 09/25/2018 0253   CHOL 203 (H) 10/21/2017 1032   TRIG 43 09/25/2018 0253   HDL 58 09/25/2018 0253   HDL 65 10/21/2017 1032   CHOLHDL 3.4 09/25/2018 0253   VLDL 9 09/25/2018 0253   LDLCALC 128 (H) 09/25/2018 0253   LDLCALC 125 (H) 10/21/2017 1032      Wt Readings from Last 3 Encounters:  12/25/18 195 lb (88.5 kg)  12/11/18 193 lb (87.5 kg)  12/06/18 194 lb 9.6 oz (88.3 kg)      Other studies Reviewed: Cath 12/11/2018 Right Coronary Artery  Mid RCA lesion 0% stenosed  Previously placed Mid RCA stent (unknown type) is widely patent.     ASSESSMENT AND PLAN:  1. CAD: He had repeat cardiac cath by Dr. Gwenlyn Found on 12/06/2018 which revealed patent stent to RCA and otherwise normal coronaries. He continues to have some complaints of shortness of breath.   I will stop Brilinta and start Plavix 75 mg daily. I will reload the Plavix 300 mg X 1 and then begin daily dosing. He is to not take Brilinta this evening.    2. Hypertension: He is having some dizziness with long periods of standing, and low normal BP this am.  I will decrease amlodipine to 5 mg daily from 10 mg dose he is currently taking.   3.  Hyperlipidemia: Goal of LDL< 70. Last lab revealed LDL of 128. He will need follow up labs on next office visit.    Current medicines  are reviewed at length with the patient today.    Labs/ tests ordered today include: None  Phill Myron. [place] Pugh, ANP, [facility]   12/25/2018 10:42 AM    [facility] [address] Office [phone] Fax [phone]

## 2018-12-25 ENCOUNTER — Encounter: Payer: Self-pay | Admitting: Adult Health

## 2018-12-25 ENCOUNTER — Ambulatory Visit: Payer: Medicare HMO | Admitting: Adult Health

## 2018-12-25 ENCOUNTER — Other Ambulatory Visit: Payer: Self-pay

## 2018-12-25 VITALS — BP 112/68 | HR 56 | Ht 70.0 in | Wt 195.0 lb

## 2018-12-25 DIAGNOSIS — E7800 Pure hypercholesterolemia, unspecified: Secondary | ICD-10-CM | POA: Diagnosis not present

## 2018-12-25 DIAGNOSIS — I2510 Atherosclerotic heart disease of native coronary artery without angina pectoris: Secondary | ICD-10-CM | POA: Diagnosis not present

## 2018-12-25 DIAGNOSIS — I10 Essential (primary) hypertension: Secondary | ICD-10-CM

## 2018-12-25 MED ORDER — CLOPIDOGREL BISULFATE 75 MG PO TABS: 75.0000 mg | ORAL_TABLET | Freq: Every day | ORAL | 6 refills | Status: DC

## 2018-12-25 MED ORDER — AMLODIPINE BESYLATE 5 MG PO TABS: 5.0000 mg | ORAL_TABLET | Freq: Every day | ORAL | 6 refills | Status: DC

## 2018-12-25 NOTE — Patient Instructions (Addendum)
Medication Instructions:  STOP BRILINTA START PLAVIX 75MG  DAILY-TOMORROW TAKE 4 TABLETS THEN ON Wednesday TAKE 1 TABLET DAILY DECREASE AMLODIPINE 5MG  DAILY If you need a refill on your cardiac medications before your next appointment, please call your pharmacy.  Labwork: NONE When you have your labs (blood work) drawn today and your tests are completely normal, you will receive your results only by MyChart Message (if you have MyChart) -OR-  A paper copy in the mail.  If you have any lab test that is abnormal or we need to change your treatment, we will call you to review these results.  Follow-Up: You will need a follow up appointment in 1 months WITH Quay Burow, MD Jory Sims, DNP, AACC  or one of the following Advanced Practice Providers on your designated Care Team:  Kerin Ransom, PA-C  Roby Lofts, PA-C  Sande Rives, [place]   At [facility], you and your health needs are our priority.  As part of our continuing mission to provide you with exceptional heart care, we have created designated Provider Care Teams.  These Care Teams include your primary Cardiologist (physician) and Advanced Practice Providers (APPs -  Physician Assistants and Nurse Practitioners) who all work together to provide you with the care you need, when you need it.  Thank you for choosing [facility] at [facility]!!

## 2019-01-01 ENCOUNTER — Ambulatory Visit: Payer: Medicare HMO | Admitting: Sports Medicine

## 2019-01-02 ENCOUNTER — Ambulatory Visit (INDEPENDENT_AMBULATORY_CARE_PROVIDER_SITE_OTHER): Payer: Medicare HMO | Admitting: Sports Medicine

## 2019-01-02 ENCOUNTER — Encounter: Payer: Self-pay | Admitting: Sports Medicine

## 2019-01-02 VITALS — BP 132/84 | HR 53 | Ht 70.0 in | Wt 196.4 lb

## 2019-01-02 DIAGNOSIS — M9906 Segmental and somatic dysfunction of lower extremity: Secondary | ICD-10-CM | POA: Diagnosis not present

## 2019-01-02 DIAGNOSIS — M9903 Segmental and somatic dysfunction of lumbar region: Secondary | ICD-10-CM

## 2019-01-02 DIAGNOSIS — M479 Spondylosis, unspecified: Secondary | ICD-10-CM | POA: Diagnosis not present

## 2019-01-02 DIAGNOSIS — M9905 Segmental and somatic dysfunction of pelvic region: Secondary | ICD-10-CM | POA: Diagnosis not present

## 2019-01-02 NOTE — Progress Notes (Signed)
Edward Freeman. , Edward Freeman at [place]  Edward Freeman - 68 y.o. male MRN [id number]  Date of birth: 1951/02/22  Visit Date: 01/02/2019  PCP: Dorothyann Peng, NP   Referred by: Dorothyann Peng, NP  SUBJECTIVE:   Chief Complaint  Patient presents with  . Lower Back - Follow-up    XR L-spine 02/03/18.   Marland Kitchen Left Knee - Follow-up    Xr L knee 02/03/18.     HPI: Patient is here for follow-up of his left knee and back pain.  He unfortunately has had a hospitalist sedation and repeat cardiac catheterization for shortness of breath.  This was likely related to his Brilinta and the dosage has been adjusted and this is resolved.  His back and knee are slightly improved especially in the back but he is not been able to participate in physical therapy due to the cardiac catheterization that he underwent.  Overall feeling better though.  He does continue to have some numbness in the left lower extremity.  He does not have any significant exacerbation with activity but does note at the end of a full day of bass fishing he is quite sore in his low back.  REVIEW OF SYSTEMS: No significant nighttime awakenings due to this issue. Denies fevers, chills, recent weight gain or weight loss.  No night sweats.  Pt denies any change in bowel or bladder habits, muscle weakness, or falls associated with this pain.  HISTORY:  Prior history reviewed and updated per electronic medical record.  Patient Active Problem List   Diagnosis Date Noted  . Chest pain   . History of PSVT (paroxysmal supraventricular tachycardia) 09/25/2018  . Sinus bradycardia 09/25/2018    No beta blocker   . CAD S/P percutaneous coronary angioplasty 09/25/2018    Occluded RCA with L-R collaterals, no left system disease, normal LVF- s/p PCI with DES 09/15/18   . NSTEMI (non-ST elevated myocardial infarction) [facility])     Admitted with NSTEMI  09/24/18   . Unstable angina ([place])  09/24/2018  . Lumbar radiculopathy 05/12/2018    04/04/18 -nerve conduction studies of the left lower extremity Chronic L4 radiculopathy affecting the left lower extremity, mild in degree electrically.   Marland Kitchen Spondylosis 03/28/2018    XR L-spine 02/03/18 IMPRESSION: Degenerative disc disease changes lumbar spine. No acute abnormalities.   . Palpitations 10/21/2017    Palpitations   . Dyslipidemia, goal LDL below 70 10/21/2015    hyperlipidemia   . Essential hypertension 05/17/2013    hypertension   . SOB (shortness of breath) 05/17/2013    Dyspnea on exertion   . Leg pain 05/17/2013    Leg pain    Social History   Occupational History  . Not on file  Tobacco Use  . Smoking status: Never Smoker  . Smokeless tobacco: Never Used  Substance and Sexual Activity  . Alcohol use: No    Comment: 1-2 beers a month  . Drug use: No  . Sexual activity: Not on file   Social History   Social History Narrative  . Not on file    OBJECTIVE:  VS:  HT:5\' 10"  (177.8 cm)   WT:196 lb 6.4 oz (89.1 kg)  BMI:28.18    BP:132/84  HR:(!) 53bpm  TEMP: ( )  RESP:98 %   PHYSICAL EXAM: Adult male. No acute distress.  Alert and appropriate. Good cervical and lumbar range of motion although there are functional limitations. Negative Spurling's  compression test and Lhermitte's compression test.   Upper extremity lower extremity strength is 5/5 in all myotomes. Normal sensation Significant anterior chain dominant posture.    ASSESSMENT:   1. Spondylosis   2. Somatic dysfunction of lower extremity   3. Somatic dysfunction of lumbar region   4. Somatic dysfunction of pelvis region     PROCEDURES:  PROCEDURE NOTE : OSTEOPATHIC MANIPULATION The decision today to treat with Osteopathic Manipulative Therapy (OMT) was based on physical exam findings. Verbal consent was obtained following a discussion with the patient regarding the of risks, benefits and potential side effects, including an  acute pain flare,post manipulation soreness and need for repeat treatments.     Contraindications to OMT: NONE  Manipulation was performed as below: [facility] Treated & Osteopathic Exam Findings  LUMBAR SPINE:  L4 FRS LEFT (Flexed, Rotated & Sidebent) PELVIS:  Left psoas spasm Left anterior innonimate LOWER EXTREMITIES:  Left hip flexor contracture Left piriformis spasm   OMT Techniques Used  HVLA muscle energy myofascial release HVLA - Long Lever    The patient tolerated the treatment well and reported Improved symptoms following treatment today. Patient was given medications, exercises, stretches and lifestyle modifications per AVS and verbally.     PLAN:  Pertinent additional documentation may be included in corresponding procedure notes, imaging studies, problem based documentation and patient instructions.  No problem-specific Assessment & Plan notes found for this encounter.  Ultimately he is slightly better and I believe that the underlying radiculitis component of this has slightly improved.  Continues to have functionally tight hip flexors and should respond well to osteopathic manipulation and continued physical therapy.  Continue previously prescribed home exercise program.   Osteopathic manipulation was performed today based on physical exam findings.  Patient has responded well to osteopathic manipulation previously the prior manipulation did not provide permanent long lasting relief.  The patient does feel as though there was significant benefit to the prior manipulation and they wished for repeat manipulation today.  They understand that home therapeutic exercises are critical part of the healing/treatment process and will continue with self treatment between now and their next visit as outlined.  The patient understands that the frequency of visits is meant to provide a stimulus to promote the body's own ability to heal and is not meant to be the sole means for  improvement in their symptoms.  Activity modifications and the importance of avoiding exacerbating activities (limiting pain to no more than a 4 / 10 during or following activity) recommended and discussed.  Discussed red flag symptoms that warrant earlier emergent evaluation and patient voices understanding.   No orders of the defined types were placed in this encounter.  Lab Orders  No laboratory test(s) ordered today   Imaging Orders  No imaging studies ordered today   Referral Orders  No referral(s) requested today    Return in about 8 weeks (around 02/27/2019) for consideration of repeat Osteopathic Manipulation.          Gerda Diss, [facility] Physician

## 2019-01-02 NOTE — Patient Instructions (Addendum)
PT at [place] is [phone]

## 2019-01-09 ENCOUNTER — Encounter: Payer: Self-pay | Admitting: Physical Therapy

## 2019-01-09 ENCOUNTER — Ambulatory Visit: Payer: Medicare HMO | Attending: Sports Medicine | Admitting: Physical Therapy

## 2019-01-09 ENCOUNTER — Other Ambulatory Visit: Payer: Self-pay

## 2019-01-09 DIAGNOSIS — M5442 Lumbago with sciatica, left side: Secondary | ICD-10-CM | POA: Insufficient documentation

## 2019-01-09 DIAGNOSIS — G8929 Other chronic pain: Secondary | ICD-10-CM | POA: Diagnosis not present

## 2019-01-09 DIAGNOSIS — R279 Unspecified lack of coordination: Secondary | ICD-10-CM | POA: Insufficient documentation

## 2019-01-09 DIAGNOSIS — M6281 Muscle weakness (generalized): Secondary | ICD-10-CM | POA: Insufficient documentation

## 2019-01-09 NOTE — Patient Instructions (Signed)
Access Code: [email or web address]  URL: [email or web address]  Date: 01/09/2019  Prepared by: Jari Favre   Exercises  Supine Figure 4 Piriformis Stretch - 3 reps - 1 sets - 30 sec hold - 1x daily - 7x weekly  Supine Hamstring Stretch - 3 reps - 1 sets - 30 sec hold - 1x daily - 7x weekly  Supine Single Knee to Chest Stretch - 5 reps - 1 sets - 10 sec hold - 2x daily - 7x weekly

## 2019-01-09 NOTE — Therapy (Signed)
[facility] [address] [place], [place], [address] Phone: [phone]   Fax:  [phone]  Physical Therapy Evaluation  Patient Details  Name: KASHIUS DOMINIC MRN: [id number] Date of Birth: May 01, 1951 Referring Provider (PT): Gerda Diss, DO   Encounter Date: 01/09/2019  PT End of Session - 01/09/19 0842    Visit Number  1    Date for PT Re-Evaluation  02/20/19    PT Start Time  0842    PT Stop Time  0925    PT Time Calculation (min)  43 min    Activity Tolerance  Patient tolerated treatment well    Behavior During Therapy  [facility] for tasks assessed/performed       Past Medical History:  Diagnosis Date  . CAD (coronary artery disease)    a. NSTEMI 09/2018 s/p DES to RCA, EF 55-60%.  Marland Kitchen GERD (gastroesophageal reflux disease)   . Hyperlipidemia   . Hypertension   . Migraines   . Nodule of left lung   . PSVT (paroxysmal supraventricular tachycardia) ([place])   . Sinus bradycardia   . Syncope and collapse     Past Surgical History:  Procedure Laterality Date  . CORONARY STENT INTERVENTION N/A 09/25/2018   Procedure: CORONARY STENT INTERVENTION;  Surgeon: Jettie Booze, MD;  Location: [place] [facility];  Service: Cardiovascular;  Laterality: N/A;  . LEFT HEART CATH AND CORONARY ANGIOGRAPHY N/A 09/25/2018   Procedure: LEFT HEART CATH AND CORONARY ANGIOGRAPHY;  Surgeon: Jettie Booze, MD;  Location: [place] [facility];  Service: Cardiovascular;  Laterality: N/A;  . LEFT HEART CATH AND CORONARY ANGIOGRAPHY N/A 12/11/2018   Procedure: LEFT HEART CATH AND CORONARY ANGIOGRAPHY;  Surgeon: Lorretta Harp, MD;  Location: [place] [facility];  Service: Cardiovascular;  Laterality: N/A;    There were no vitals filed for this visit.   Subjective Assessment - 01/09/19 0845    Subjective  Pt has been having low back pain and knee pain.  He reports numbness in lower left leg up to the knee and cramping in the  left leg after standing a long time.  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  He is on his feet for about 8 hours to fish.  He has a cleaning business and is standing/walking every night for 4-5 hours.      Pertinent History  heart attack with stent placement Nov 2019    How long can you walk comfortably?  just feel giving way of left leg sometimes    Patient Stated Goals  knowing that I can function on a daily basis without discomfort    Currently in Pain?  Yes    Pain Score  7     Pain Location  Leg    Pain Orientation  Left    Pain Descriptors / Indicators  Numbness;Sharp;Tightness;Tingling    Pain Type  Chronic pain    Pain Radiating Towards  feel some in back and mostly in and around knee; down to lower leg    Aggravating Factors   getting up after standing for a while    Pain Relieving Factors  heating pad    Effect of Pain on Daily Activities  not feeling like I can do yard work    Multiple Pain Sites  No         [facility] PT Assessment - 01/09/19 0001      Assessment   Medical Diagnosis  M54.16 (ICD-10-CM) -  Lumbar radiculopathy    Referring Provider (PT)  Gerda Diss, DO    Onset Date/Surgical Date  --   2-3 months ago   Prior Therapy  No      Precautions   Precautions  None      Restrictions   Weight Bearing Restrictions  No      Balance Screen   Has the patient fallen in the past 6 months  No      [place] residence    Living Arrangements  Spouse/significant other      Prior Function   Level of Independence  Independent    Vocation  Full time employment      Cognition   Overall Cognitive Status  Within Functional Limits for tasks assessed      Observation/Other Assessments   Focus on Therapeutic Outcomes (FOTO)   43% limited   goal 31%     Posture/Postural Control   Posture/Postural Control  Postural limitations    Postural Limitations  Flexed trunk;Rounded Shoulders;Decreased lumbar lordosis      ROM / Strength    AROM / PROM / Strength  AROM;Strength;PROM      AROM   AROM Assessment Site  Lumbar    Lumbar Flexion  50%    Lumbar Extension  50%    Lumbar - Right Side Bend  80% +pain    Lumbar - Left Side Bend  80%    Lumbar - Right Rotation  80%    Lumbar - Left Rotation  80%      PROM   Overall PROM Comments  hip IR/ER 50% limited bilaterally      Strength   Overall Strength Comments  Left knee extension 4+/5; left hip flexion 4+/5; Rt LE 5/5 throughout    Strength Assessment Site  Hip;Knee      Flexibility   Soft Tissue Assessment /Muscle Length  yes    Hamstrings  80% bilateral      Palpation   Spinal mobility  hypomobility lumbar and thoracic throughout    Palpation comment  lumbar fascia restricted; lumbar and thoracic paraspinals tight      Special Tests    Special Tests  Lumbar    Lumbar Tests  Straight Leg Raise      Straight Leg Raise   Findings  Positive    Side   Left      Ambulation/Gait   Gait Pattern  Trunk flexed;Decreased stride length                Objective measurements completed on examination: See above findings.      [place] Adult PT Treatment/Exercise - 01/09/19 0001      Self-Care   Self-Care  Other Self-Care Comments    Other Self-Care Comments   educated and performed intial HEP for reduced/pain management             PT Education - 01/09/19 0927    Education Details   Access Code: [email or web address]     Person(s) Educated  Patient    Methods  Explanation;Demonstration;Handout;Verbal cues;Tactile cues    Comprehension  Verbalized understanding;Returned demonstration          PT Long Term Goals - 01/09/19 0934      PT LONG TERM GOAL #1   Title  Pt will report 50% less discomfort in the left knee and leg    Time  6    Period  Weeks    Status  New    Target Date  02/20/19      PT LONG TERM GOAL #2   Title  Pt will be able to lift 10 lb with correct body mechanics in order to safely return to doing yard work.    Time  6     Period  Weeks    Status  New    Target Date  02/20/19      PT LONG TERM GOAL #3   Title  Pt will be able to walk as long as needed for work and chores without feeling his leg giving out    Time  6    Period  Weeks    Status  New    Target Date  02/20/19      PT LONG TERM GOAL #4   Title  FOTO < or = to 31% limted    Time  6    Period  Weeks    Status  New    Target Date  02/20/19             Plan - 01/09/19 0350    Clinical Impression Statement  Pt presents to clinic today due to chronic low back pain that is currently causing radicular symptoms in left leg with some weakness of left LE.  Pt has slightly flexed trunk.  He has limted lumbar and thoracic ROM and increased muscle spasm in paraspinals and lumbar fascial restrictions.  Pt has limited ROM of bilateral hip rotation.  He has some weakness in Lt LE compared to the right side.  Pt will benefit from skilled PT to address overall posture and improve spine and hip mobility along with core strength.  He will benefit from skilled PT to address impairments so he can return to activities of daily living and his work without increased pain and know    Personal Factors and Comorbidities  Age;Comorbidity 1;Profession    Comorbidities  recent hear surgery    Examination-Activity Limitations  Lift;Bend    Examination-Participation Restrictions  Yard Work    Stability/Clinical Decision Making  Evolving/Moderate complexity    Clinical Decision Making  Moderate    Rehab Potential  Good    PT Frequency  2x / week    PT Duration  6 weeks    PT Treatment/Interventions  ADLs/Self Care Home Management;Biofeedback;Cryotherapy;Aquatic Therapy;Electrical Stimulation;Moist Heat;Traction;Functional mobility training;Therapeutic activities;Therapeutic exercise;Neuromuscular re-education;Patient/family education;Manual techniques;Dry needling;Passive range of motion;Taping    PT Next Visit Plan  lumbar ROM and core strengthening, hip ROM, Lt LE  strength and stability, heat to low back during warm up    PT Home Exercise Plan   Access Code: [email or web address]     Consulted and Agree with Plan of Care  Patient       Patient will benefit from skilled therapeutic intervention in order to improve the following deficits and impairments:  Abnormal gait, Pain, Postural dysfunction, Impaired flexibility, Increased fascial restricitons, Decreased strength, Decreased activity tolerance, Increased muscle spasms, Decreased range of motion  Visit Diagnosis: Chronic left-sided low back pain with left-sided sciatica  Muscle weakness (generalized)  Unspecified lack of coordination     Problem List Patient Active Problem List   Diagnosis Date Noted  . Chest pain   . History of PSVT (paroxysmal supraventricular tachycardia) 09/25/2018  . Sinus bradycardia 09/25/2018  . CAD S/P percutaneous coronary angioplasty 09/25/2018  . NSTEMI (non-ST elevated myocardial infarction) ([place])   . Unstable angina ([place]) 09/24/2018  . Lumbar radiculopathy 05/12/2018  . Spondylosis 03/28/2018  .  Palpitations 10/21/2017  . Dyslipidemia, goal LDL below 70 10/21/2015  . Essential hypertension 05/17/2013  . SOB (shortness of breath) 05/17/2013  . Leg pain 05/17/2013    Jule Ser, PT 01/09/2019, 12:22 PM  [facility] [address] [place], [place], [address] Phone: [phone]   Fax:  [phone]  Name: Loc HARREL MRN: [id number] Date of Birth: [birth year]

## 2019-01-19 ENCOUNTER — Other Ambulatory Visit: Payer: Self-pay

## 2019-01-19 ENCOUNTER — Ambulatory Visit: Payer: Medicare HMO | Admitting: Physical Therapy

## 2019-01-19 ENCOUNTER — Encounter: Payer: Self-pay | Admitting: Physical Therapy

## 2019-01-19 DIAGNOSIS — M5442 Lumbago with sciatica, left side: Secondary | ICD-10-CM | POA: Diagnosis not present

## 2019-01-19 DIAGNOSIS — M6281 Muscle weakness (generalized): Secondary | ICD-10-CM

## 2019-01-19 DIAGNOSIS — R279 Unspecified lack of coordination: Secondary | ICD-10-CM

## 2019-01-19 DIAGNOSIS — G8929 Other chronic pain: Secondary | ICD-10-CM

## 2019-01-19 NOTE — Therapy (Addendum)
[facility] [address] [place], [place], [address] Phone: [phone]   Fax:  [phone]  Physical Therapy Treatment/Hold  Patient Details  Name: Edward Freeman MRN: [id number] Date of Birth: Apr 04, 1951 Referring Provider (PT): Gerda Diss, DO   Encounter Date: 01/19/2019  PT End of Session - 01/19/19 0841    Visit Number  2    Date for PT Re-Evaluation  02/20/19    PT Start Time  0800    PT Stop Time  0841    PT Time Calculation (min)  41 min    Activity Tolerance  Patient tolerated treatment well;No increased pain    Behavior During Therapy  WFL for tasks assessed/performed       Past Medical History:  Diagnosis Date  . CAD (coronary artery disease)    a. NSTEMI 09/2018 s/p DES to RCA, EF 55-60%.  Marland Kitchen GERD (gastroesophageal reflux disease)   . Hyperlipidemia   . Hypertension   . Migraines   . Nodule of left lung   . PSVT (paroxysmal supraventricular tachycardia) ([place])   . Sinus bradycardia   . Syncope and collapse     Past Surgical History:  Procedure Laterality Date  . CORONARY STENT INTERVENTION N/A 09/25/2018   Procedure: CORONARY STENT INTERVENTION;  Surgeon: Jettie Booze, MD;  Location: [place] [facility];  Service: Cardiovascular;  Laterality: N/A;  . LEFT HEART CATH AND CORONARY ANGIOGRAPHY N/A 09/25/2018   Procedure: LEFT HEART CATH AND CORONARY ANGIOGRAPHY;  Surgeon: Jettie Booze, MD;  Location: [place] [facility];  Service: Cardiovascular;  Laterality: N/A;  . LEFT HEART CATH AND CORONARY ANGIOGRAPHY N/A 12/11/2018   Procedure: LEFT HEART CATH AND CORONARY ANGIOGRAPHY;  Surgeon: Lorretta Harp, MD;  Location: [place] [facility];  Service: Cardiovascular;  Laterality: N/A;    There were no vitals filed for this visit.  Subjective Assessment - 01/19/19 0803    Subjective  Pt states that things are ok. He has had recent increase in his Lt knee pain. He says pain is  "light" however he rates it a     Pertinent History  heart attack with stent placement Nov 2019    How long can you walk comfortably?  just feel giving way of left leg sometimes    Patient Stated Goals  knowing that I can function on a daily basis without discomfort    Currently in Pain?  Yes    Pain Score  7     Pain Location  Knee    Pain Orientation  Left;Anterior    Pain Descriptors / Indicators  Aching    Pain Type  Chronic pain    Pain Radiating Towards  just the knee right now     Pain Onset  More than a month ago    Pain Frequency  Intermittent    Aggravating Factors   standing for long periods of time    Pain Relieving Factors  heating pad    Effect of Pain on Daily Activities  limited ability to do yard work                        [facility] Adult PT Treatment/Exercise - 01/19/19 0001      Exercises   Exercises  Lumbar      Lumbar Exercises: Machines for Strengthening   Other Lumbar Machine Exercise  lat pulldown #45 x12 reps       Lumbar  Exercises: Supine   Bridge  15 reps    Bridge Limitations  BUE pressdown for abdominal activation    Other Supine Lumbar Exercises  hooklying low trunk rotation x15 reps each direction    Other Supine Lumbar Exercises  B knees to chest with LE on red physioball x15 reps (PT overpressure)       Lumbar Exercises: Sidelying   Other Sidelying Lumbar Exercises  thoracic rotation open book stretch x15 reps      Manual Therapy   Manual Therapy  Soft tissue mobilization;Joint mobilization    Manual therapy comments  Lt quad stretch in prone 3x30 sec     Joint Mobilization  CPAs Grade III-IV x2 bouts     Soft tissue mobilization  STM lumbar paraspinals, Lt QL release             PT Education - 01/19/19 0841    Education Details  updated and reviewed HEP    Person(s) Educated  Patient    Methods  Explanation;Verbal cues;Handout    Comprehension  Verbalized understanding;Returned demonstration          PT Long  Term Goals - 01/09/19 0934      PT LONG TERM GOAL #1   Title  Pt will report 50% less discomfort in the left knee and leg    Time  6    Period  Weeks    Status  New    Target Date  02/20/19      PT LONG TERM GOAL #2   Title  Pt will be able to lift 10 lb with correct body mechanics in order to safely return to doing yard work.    Time  6    Period  Weeks    Status  New    Target Date  02/20/19      PT LONG TERM GOAL #3   Title  Pt will be able to walk as long as needed for work and chores without feeling his leg giving out    Time  6    Period  Weeks    Status  New    Target Date  02/20/19      PT LONG TERM GOAL #4   Title  FOTO < or = to 31% limted    Time  6    Period  Weeks    Status  New    Target Date  02/20/19            Plan - 01/19/19 0842    Clinical Impression Statement  Pt arrived with reports of Lt knee pain up to 7/10. Pt was instructed in lumbar and hip mobility exercises during today's session. Therapist also completed manual treatment to improve thoracic/lumbar spine joint and soft tissue restrictions. Pt has significant muscle tightness in the low back as well as the hips. He was encouraged to continue with HEP adherence and was provided with one update as well. End of session, pt reported Lt knee pain fully resolved.     Personal Factors and Comorbidities  Age;Comorbidity 1;Profession    Comorbidities  recent heart surgery    Examination-Activity Limitations  Lift;Bend    Examination-Participation Restrictions  Yard Work    Stability/Clinical Decision Making  Evolving/Moderate complexity    Rehab Potential  Good    PT Frequency  2x / week    PT Duration  6 weeks    PT Treatment/Interventions  ADLs/Self Care Home Management;Biofeedback;Cryotherapy;Aquatic Therapy;Electrical Stimulation;Moist Heat;Traction;Functional mobility training;Therapeutic activities;Therapeutic exercise;Neuromuscular  re-education;Patient/family education;Manual techniques;Dry  needling;Passive range of motion;Taping    PT Next Visit Plan  lumbar ROM and core strengthening, hip ROM, Lt LE strength and stability, heat to low back during warm up if needed    PT Home Exercise Plan   Access Code: [email or web address]     Consulted and Agree with Plan of Care  Patient       Patient will benefit from skilled therapeutic intervention in order to improve the following deficits and impairments:  Abnormal gait, Pain, Postural dysfunction, Impaired flexibility, Increased fascial restricitons, Decreased strength, Decreased activity tolerance, Increased muscle spasms, Decreased range of motion  Visit Diagnosis: Chronic left-sided low back pain with left-sided sciatica  Muscle weakness (generalized)  Unspecified lack of coordination     Problem List Patient Active Problem List   Diagnosis Date Noted  . Chest pain   . History of PSVT (paroxysmal supraventricular tachycardia) 09/25/2018  . Sinus bradycardia 09/25/2018  . CAD S/P percutaneous coronary angioplasty 09/25/2018  . NSTEMI (non-ST elevated myocardial infarction) ([place])   . Unstable angina ([place]) 09/24/2018  . Lumbar radiculopathy 05/12/2018  . Spondylosis 03/28/2018  . Palpitations 10/21/2017  . Dyslipidemia, goal LDL below 70 10/21/2015  . Essential hypertension 05/17/2013  . SOB (shortness of breath) 05/17/2013  . Leg pain 05/17/2013    8:43 AM,01/19/19 Sherol Dade PT, DPT [place] [facility] [place]  [facility] [address], [place], [place], [address] Phone: [phone]   Fax:  [phone]  Name: Edward Freeman MRN: [id number] Date of Birth: 1951/09/27  *addendum to update pt status. He would like to be put on hold pending clinic open date.  12:06 PM,01/30/19 Sherol Dade PT, [place] [facility] [place]  PHYSICAL THERAPY DISCHARGE SUMMARY  Visits from Start  of Care: 2  Current functional level related to goals / functional outcomes: See above   Remaining deficits: See above   Education / Equipment: HEP  Plan: Patient agrees to discharge.  Patient goals were not met. Patient is being discharged due to not returning since the last visit.  ?????     [employer], PT 05/01/19 1:54 PM

## 2019-01-22 NOTE — Progress Notes (Signed)
Cardiology Office Note   Date:  01/23/2019   ID:  Edward, Freeman [birth year], MRN [id number]  PCP:  Edward Peng, NP  Cardiologist: [facility]  Chief Complaint  Patient presents with  . Follow-up     History of Present Illness: Edward Freeman is a 68 y.o. male who presents for ongoing assessment and management of CAD and HTN. Cardiac cath on 09/25/2018 which revealed occluded dominant RCA with left to right collaterals. He has a DES placed to the RCA. He continues on DAPT and ASA and Brilinta, along with high dose statin.   On last office visit, I stopped Brilinta due to intolerance causing him to have worsening breathing status. He was reloaded on Plavix 300 mg and then was to take  75 mg daily thereafter. He was having some dizziness with long periods of standing. I decreased amlodipine to 5 mg from 10 mg. He is due for fasting lipids and LFT's this visit.   He has had marked improvement of his symptoms since changing to clopidogrel from Brilinta and reduction of amlodipine to 5 mg daily. He is feeling back to normal and is anxious to return to normal activities.   Past Medical History:  Diagnosis Date  . CAD (coronary artery disease)    a. NSTEMI 09/2018 s/p DES to RCA, EF 55-60%.  Marland Kitchen GERD (gastroesophageal reflux disease)   . Hyperlipidemia   . Hypertension   . Migraines   . Nodule of left lung   . PSVT (paroxysmal supraventricular tachycardia) ([place])   . Sinus bradycardia   . Syncope and collapse     Past Surgical History:  Procedure Laterality Date  . CORONARY STENT INTERVENTION N/A 09/25/2018   Procedure: CORONARY STENT INTERVENTION;  Surgeon: Jettie Booze, MD;  Location: [place] [facility];  Service: Cardiovascular;  Laterality: N/A;  . LEFT HEART CATH AND CORONARY ANGIOGRAPHY N/A 09/25/2018   Procedure: LEFT HEART CATH AND CORONARY ANGIOGRAPHY;  Surgeon: Jettie Booze, MD;  Location: [place] [facility];  Service: Cardiovascular;  Laterality: N/A;  .  LEFT HEART CATH AND CORONARY ANGIOGRAPHY N/A 12/11/2018   Procedure: LEFT HEART CATH AND CORONARY ANGIOGRAPHY;  Surgeon: Lorretta Harp, MD;  Location: [place] [facility];  Service: Cardiovascular;  Laterality: N/A;     Current Outpatient Medications  Medication Sig Dispense Refill  . amLODipine (NORVASC) 5 MG tablet Take 1 tablet (5 mg total) by mouth daily. 90 tablet 3  . aspirin 81 MG tablet Take 1 tablet (81 mg total) by mouth daily. (Patient taking differently: Take 81 mg by mouth daily after lunch. ) 30 tablet 11  . atorvastatin (LIPITOR) 80 MG tablet Take 1 tablet (80 mg total) by mouth daily. 90 tablet 3  . clopidogrel (PLAVIX) 75 MG tablet Take 1 tablet (75 mg total) by mouth daily. 90 tablet 3  . finasteride (PROSCAR) 5 MG tablet Take 1 tablet (5 mg total) by mouth daily. 90 tablet 3  . nitroGLYCERIN (NITROSTAT) 0.4 MG SL tablet Place 1 tablet (0.4 mg total) under the tongue every 5 (five) minutes as needed for chest pain (up to 3 doses. If taking 3rd dose call 911). 25 tablet 3   No current facility-administered medications for this visit.     Allergies:   Patient has no known allergies.    Social History:  The patient  reports that he has never smoked. He has never used smokeless tobacco. He reports that he does not drink alcohol or use drugs.  Family History:  The patient's family history includes Alcohol abuse in his father; Diabetes in his brother, sister, and sister; Hypertension in his mother; Stroke in his mother.    ROS: All other systems are reviewed and negative. Unless otherwise mentioned in H&P    PHYSICAL EXAM: VS:  BP 127/65 (BP Location: Left Arm, Patient Position: Sitting, Cuff Size: Normal)   Pulse (!) 45   Ht 5\' 10"  (1.778 m)   Wt 196 lb 3.2 oz (89 kg)   BMI 28.15 kg/m  , BMI Body mass index is 28.15 kg/m. GEN: Well nourished, well developed, in no acute distress HEENT: normal Neck: no JVD, carotid bruits, or masses Cardiac: RRR; no murmurs, rubs,  or gallops,no edema  Respiratory:  Clear to auscultation bilaterally, normal work of breathing GI: soft, nontender, nondistended, + BS MS: no deformity or atrophy Skin: warm and dry, no rash Neuro:  Strength and sensation are intact Psych: euthymic mood, full affect   EKG:  Not competed this office visit.   Recent Labs: 09/24/2018: TSH 0.642 09/26/2018: Magnesium 2.1 10/04/2018: ALT 25 12/06/2018: BUN 10; Creatinine, Ser 0.95; Hemoglobin 14.7; Platelets 244; Potassium 4.6; Sodium 148    Lipid Panel    Component Value Date/Time   CHOL 195 09/25/2018 0253   CHOL 203 (H) 10/21/2017 1032   TRIG 43 09/25/2018 0253   HDL 58 09/25/2018 0253   HDL 65 10/21/2017 1032   CHOLHDL 3.4 09/25/2018 0253   VLDL 9 09/25/2018 0253   LDLCALC 128 (H) 09/25/2018 0253   LDLCALC 125 (H) 10/21/2017 1032      Wt Readings from Last 3 Encounters:  01/23/19 196 lb 3.2 oz (89 kg)  01/02/19 196 lb 6.4 oz (89.1 kg)  12/25/18 195 lb (88.5 kg)      Other studies Reviewed: Cardiac Cath 09/25/2018 Conclusion     Mid RCA lesion is 100% stenosed. Left to right collaterals present.  A drug-eluting stent was successfully placed using a STENT SYNERGY DES 3.5X20.  Post intervention, there is a 0% residual stenosis.  The left ventricular systolic function is normal.  LV end diastolic pressure is normal.  The left ventricular ejection fraction is 55-65% by visual estimate.  There is no aortic valve stenosis.   Repeat cath  12/11/2018 Conclusion     Previously placed Mid RCA stent (unknown type) is widely patent.     Echocardiogram 09/26/2019 Left ventricle: The cavity size was normal. Wall thickness was   normal. Systolic function was normal. The estimated ejection   fraction was in the range of 55% to 60%. Basal inferior   hypokinesis. Doppler parameters are consistent with abnormal left   ventricular relaxation (grade 1 diastolic dysfunction). The E/e&'   ratio is <8, suggesting  normal LV filling pressure. - Mitral valve: Mildly thickened leaflets . There was trivial   regurgitation. - Left atrium: The atrium was normal in size.  ASSESSMENT AND PLAN:  1. CAD: He is feeling much better concerning breathing status with change from ticagrelor to clopidogrel. He denies chest pain or new symptoms. He will resume normal activities. Continue current regimen   2. Hypertension: Change in dose to amlodipine 5 mg from 10 mg has improved his BP from soft levels. His energy level is much improved. Continue current regimen.  3. Hypercholesterolemia: He will have fasting lipids and LFT's completed for ongoing management of status with goal of LDL < 70.  Current medicines are reviewed at length with the patient today.    Labs/  tests ordered today include: Lipids and LFT's.  Phill Myron. West Pugh, ANP, [facility]   01/23/2019 9:32 AM    [facility] [place] [address] Office [phone] Fax [phone]

## 2019-01-23 ENCOUNTER — Ambulatory Visit: Payer: Medicare HMO | Admitting: Adult Health

## 2019-01-23 ENCOUNTER — Encounter: Payer: Self-pay | Admitting: Adult Health

## 2019-01-23 ENCOUNTER — Other Ambulatory Visit: Payer: Self-pay

## 2019-01-23 VITALS — BP 127/65 | HR 45 | Ht 70.0 in | Wt 196.2 lb

## 2019-01-23 DIAGNOSIS — Z79899 Other long term (current) drug therapy: Secondary | ICD-10-CM | POA: Diagnosis not present

## 2019-01-23 DIAGNOSIS — I2510 Atherosclerotic heart disease of native coronary artery without angina pectoris: Secondary | ICD-10-CM

## 2019-01-23 DIAGNOSIS — E7800 Pure hypercholesterolemia, unspecified: Secondary | ICD-10-CM | POA: Diagnosis not present

## 2019-01-23 DIAGNOSIS — I10 Essential (primary) hypertension: Secondary | ICD-10-CM | POA: Diagnosis not present

## 2019-01-23 MED ORDER — ATORVASTATIN CALCIUM 80 MG PO TABS: 80.0000 mg | ORAL_TABLET | Freq: Every day | ORAL | 3 refills | Status: DC

## 2019-01-23 MED ORDER — CLOPIDOGREL BISULFATE 75 MG PO TABS: 75.0000 mg | ORAL_TABLET | Freq: Every day | ORAL | 3 refills | Status: DC

## 2019-01-23 MED ORDER — AMLODIPINE BESYLATE 5 MG PO TABS: 5.0000 mg | ORAL_TABLET | Freq: Every day | ORAL | 3 refills | Status: DC

## 2019-01-23 NOTE — Patient Instructions (Addendum)
Follow-Up: You will need a follow up appointment in 6 months.  Please call our office 2 months in advance JULY 2020 to schedule this SEPT 2020 appointment.  You may see Quay Burow, MD or one of the following Advanced Practice Providers on your designated Care Team:  Kerin Ransom, [place]  Roby Lofts, PA-C  Sande Rives, [place]       Medication Instructions:  NO CHANGES- Your physician recommends that you continue on your current medications as directed. Please refer to the Current Medication list given to you today. If you need a refill on your cardiac medications before your next appointment, please call your pharmacy. Labwork: When you have labs (blood work) and your tests are completely normal, you will receive your results ONLY by [place] (if you have MyChart) -OR- A paper copy in the mail.  At [facility], you and your health needs are our priority.  As part of our continuing mission to provide you with exceptional heart care, we have created designated Provider Care Teams.  These Care Teams include your primary Cardiologist (physician) and Advanced Practice Providers (APPs -  Physician Assistants and Nurse Practitioners) who all work together to provide you with the care you need, when you need it.  Thank you for choosing [facility] at [facility]!!      ---> LIPID & LFT PT NOTIFIED TO COME IN W/IN 1 MONTH FASTING

## 2019-01-30 ENCOUNTER — Encounter: Payer: Medicare HMO | Admitting: Physical Therapy

## 2019-02-01 ENCOUNTER — Encounter: Payer: Medicare HMO | Admitting: Physical Therapy

## 2019-02-05 ENCOUNTER — Encounter: Payer: Medicare HMO | Admitting: Physical Therapy

## 2019-02-09 ENCOUNTER — Encounter: Payer: Medicare HMO | Admitting: Physical Therapy

## 2019-02-27 ENCOUNTER — Ambulatory Visit: Payer: Medicare HMO | Admitting: Sports Medicine

## 2019-06-26 IMAGING — DX DG HIP (WITH OR WITHOUT PELVIS) 2-3V*L*
2 series · 2 of 2 positions shown · non-contrast
Comparison: None

CLINICAL DATA: LEFT side low back pain and hip pain for 2 weeks,
pain radiates down LEFT leg

EXAM:
DG HIP (WITH OR WITHOUT PELVIS) 2-3V LEFT

[pelvis ap]
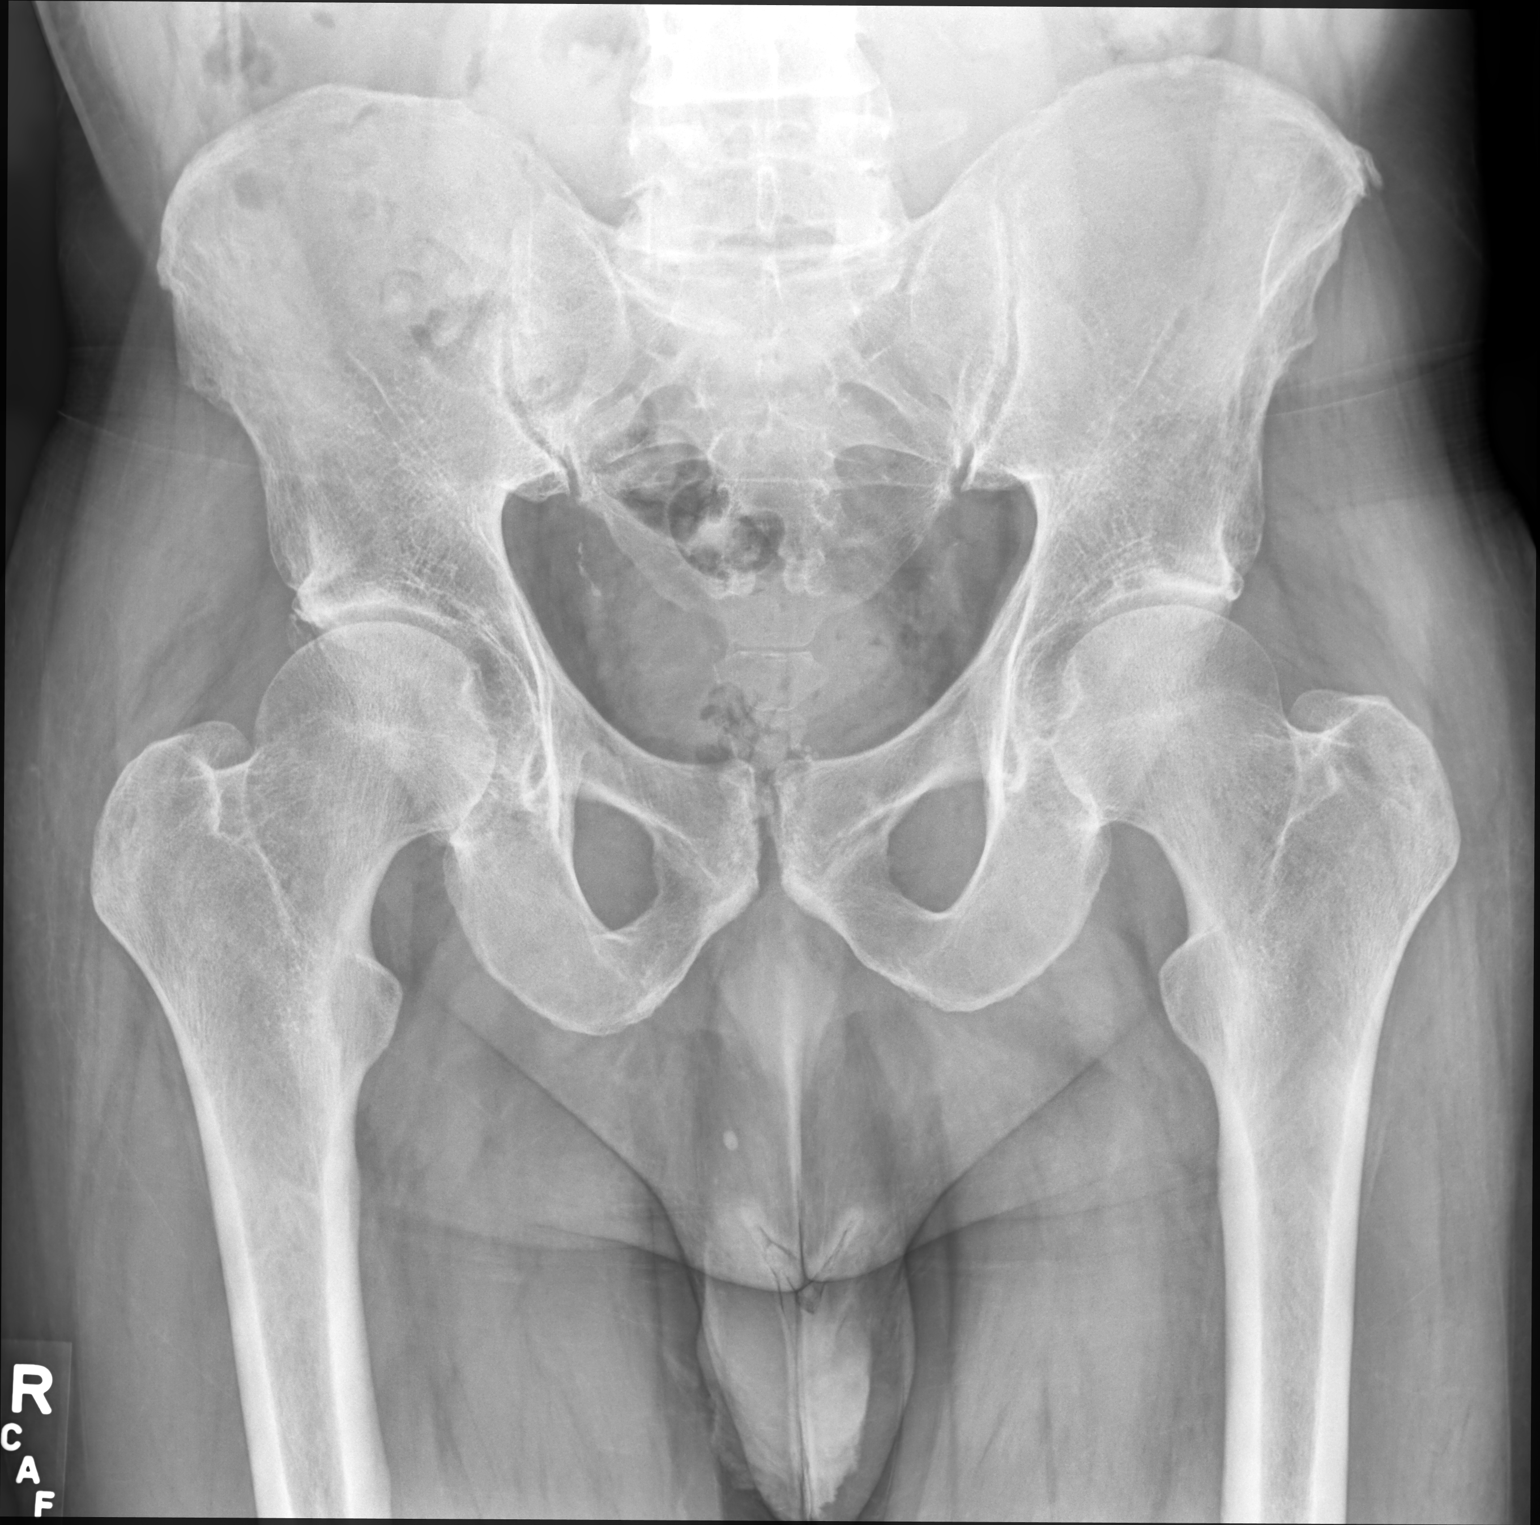

[hip joint (frog view)]
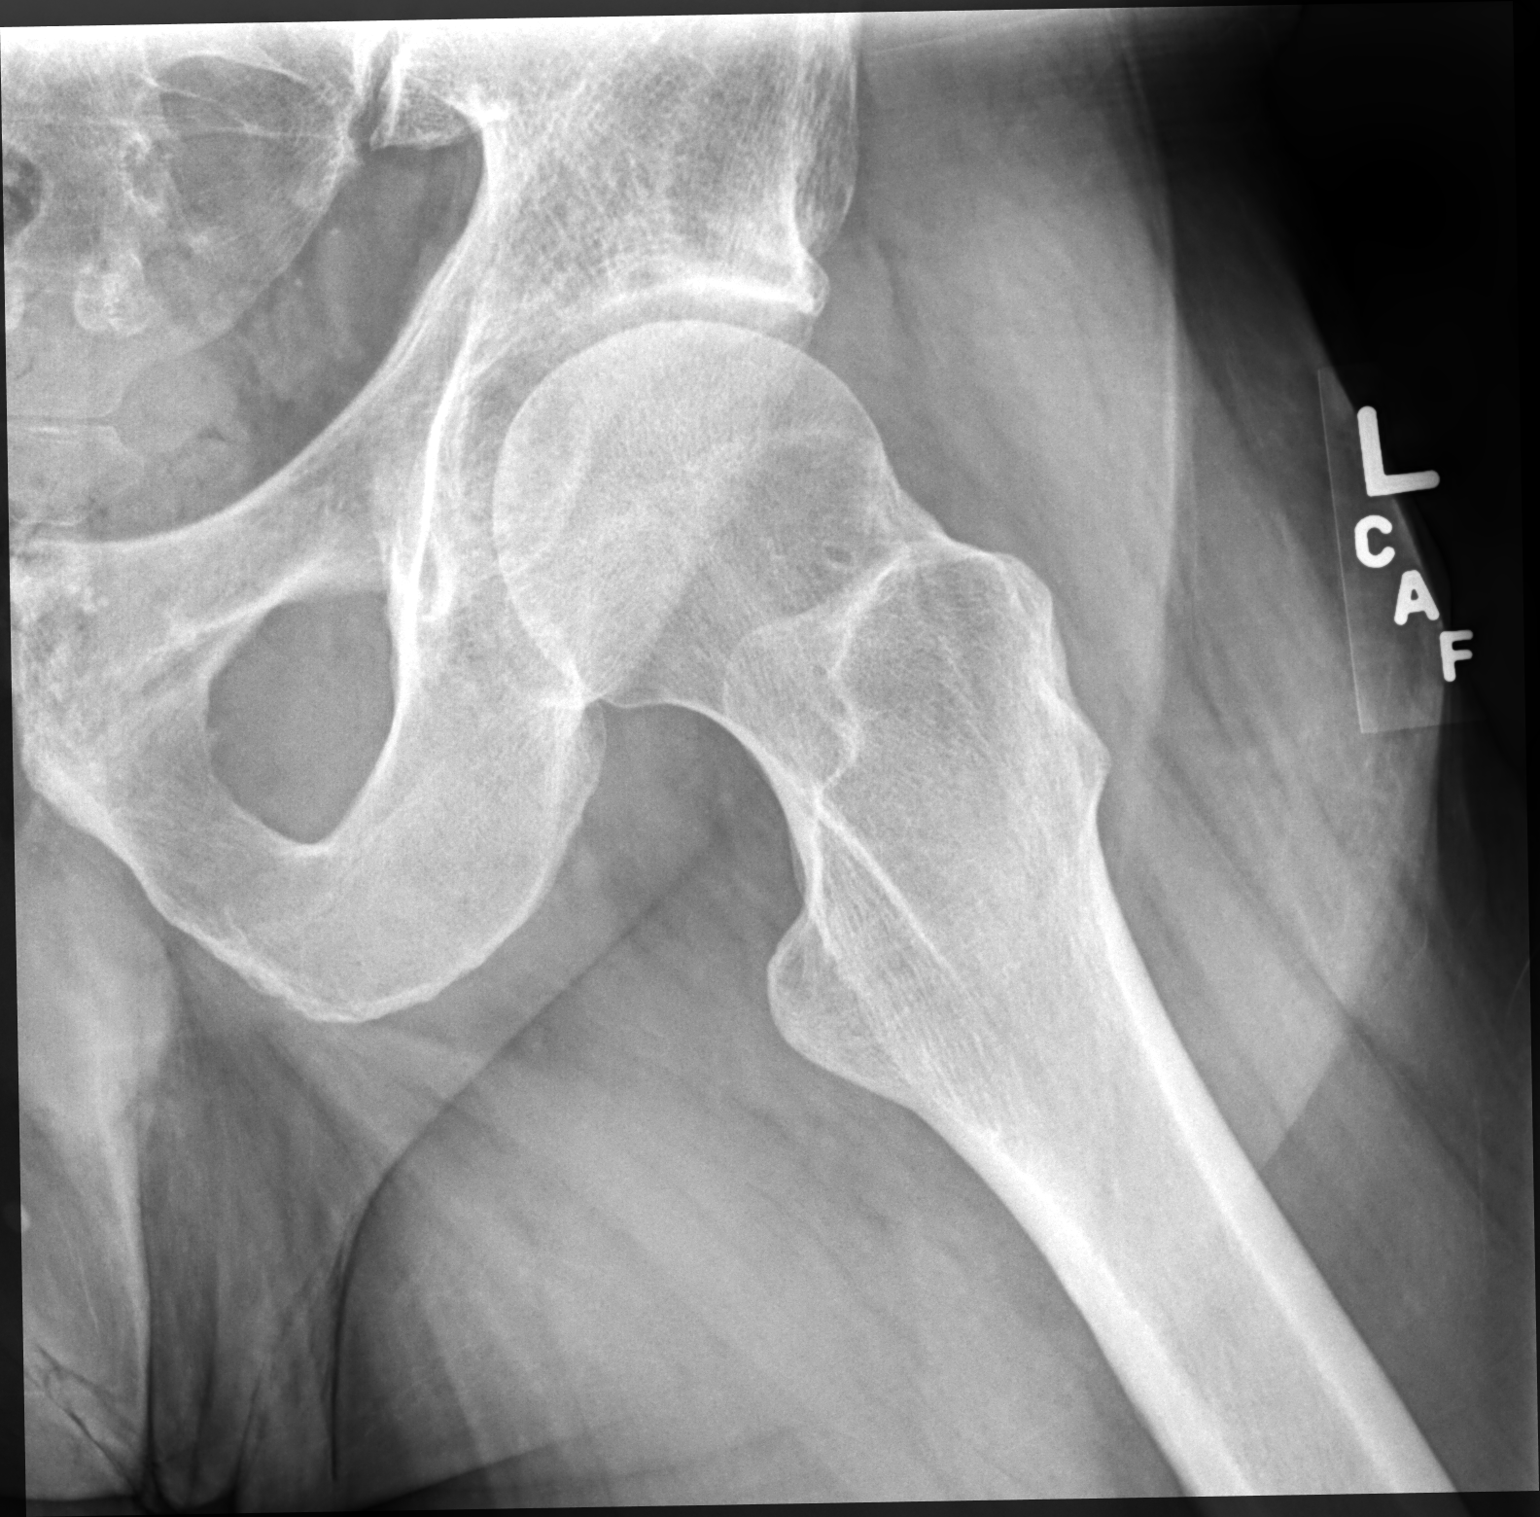

[2 of 2 positions shown; findings below may reference images not displayed]

FINDINGS: Osseous mineralization normal.

Hip and SI joint spaces preserved.

No acute fracture, dislocation, or bone destruction.
IMPRESSION: No acute osseous abnormalities.

## 2019-08-07 ENCOUNTER — Encounter: Payer: Self-pay | Admitting: Cardiovascular Disease

## 2019-08-07 ENCOUNTER — Other Ambulatory Visit: Payer: Self-pay

## 2019-08-07 ENCOUNTER — Ambulatory Visit (INDEPENDENT_AMBULATORY_CARE_PROVIDER_SITE_OTHER): Payer: Medicare HMO | Admitting: Cardiovascular Disease

## 2019-08-07 DIAGNOSIS — I10 Essential (primary) hypertension: Secondary | ICD-10-CM | POA: Diagnosis not present

## 2019-08-07 DIAGNOSIS — I2510 Atherosclerotic heart disease of native coronary artery without angina pectoris: Secondary | ICD-10-CM

## 2019-08-07 DIAGNOSIS — E785 Hyperlipidemia, unspecified: Secondary | ICD-10-CM

## 2019-08-07 DIAGNOSIS — Z9861 Coronary angioplasty status: Secondary | ICD-10-CM | POA: Diagnosis not present

## 2019-08-07 LAB — HEPATIC FUNCTION PANEL
ALT: 34 IU/L (ref 0–44)
AST: 23 IU/L (ref 0–40)
Albumin: 4 g/dL (ref 3.8–4.8)
Alkaline Phosphatase: 67 IU/L (ref 39–117)
Bilirubin Total: 0.9 mg/dL (ref 0.0–1.2)
Bilirubin, Direct: 0.22 mg/dL (ref 0.00–0.40)
Total Protein: 6.4 g/dL (ref 6.0–8.5)

## 2019-08-07 LAB — LIPID PANEL
Chol/HDL Ratio: 2.9 ratio (ref 0.0–5.0)
Cholesterol, Total: 166 mg/dL (ref 100–199)
HDL: 57 mg/dL (ref >39)
LDL Chol Calc (NIH): 97 mg/dL (ref 0–99)
Triglycerides: 58 mg/dL (ref 0–149)
VLDL Cholesterol Cal: 12 mg/dL (ref 5–40)

## 2019-08-07 MED ORDER — CLOPIDOGREL BISULFATE 75 MG PO TABS: 75.0000 mg | ORAL_TABLET | Freq: Every day | ORAL | 3 refills | Status: DC

## 2019-08-07 NOTE — Assessment & Plan Note (Signed)
History of CAD status post PCI and drug-eluting stenting of a occluded dominant RCA 09/15/2018 by Dr. Irish Lack.  He did have left-to-right collaterals.  Because of recurrent symptoms similar to his preintervention symptoms I performed repeat catheterization 12/11/2018 radially revealing a widely patent RCA stent with no other disease.  He remains on dual antiplatelet therapy.  Since that time his strength has improved and his shortness of breath has resolved.

## 2019-08-07 NOTE — Patient Instructions (Signed)
Medication Instructions:  Your physician recommends that you continue on your current medications as directed. Please refer to the Current Medication list given to you today.  If you need a refill on your cardiac medications before your next appointment, please call your pharmacy.   Lab work: Your physician recommends that you return for lab work TODAY: LIPID AND LIVER PANELS  If you have labs (blood work) drawn today and your tests are completely normal, you will receive your results only by: [place] [facility] MyChart Message (if you have MyChart) OR . A paper copy in the mail If you have any lab test that is abnormal or we need to change your treatment, we will call you to review the results.  Testing/Procedures: NONE  Follow-Up: At [facility], you and your health needs are our priority.  As part of our continuing mission to provide you with exceptional heart care, we have created designated Provider Care Teams.  These Care Teams include your primary Cardiologist (physician) and Advanced Practice Providers (APPs -  Physician Assistants and Nurse Practitioners) who all work together to provide you with the care you need, when you need it. You will need a follow up appointment in 12 months with Dr. Quay Burow.  Please call our office 2 months in advance to schedule this appointment.

## 2019-08-07 NOTE — Assessment & Plan Note (Signed)
History of dyslipidemia on high-dose statin therapy with lipid profile performed 09/25/2018 revealing total cholesterol 195, LDL of 128 and HDL of 58.  He does admit to taking his statin sporadically.  I am we did check a fasting lipid liver profile this morning.

## 2019-08-07 NOTE — Assessment & Plan Note (Signed)
History of essential hypertension blood pressure measured today of 147/84.  He is on amlodipine.

## 2019-08-07 NOTE — Progress Notes (Signed)
08/07/2019 Edward Freeman   Dec 03, 1950  [id number]  Primary Physician Carlisle Cater, Tommi Rumps, NP Primary Cardiologist: Lorretta Harp MD [name], [place]  HPI:  Edward Freeman is a 68 y.o.  married African American male father of 2, grandfather to 2 grandchildren he works doing Furniture conservator/restorer.I last saw him in the office  11/03/2018. I performed cardiac catheterization on him 11/04/03 which was completely normal. His cardiac risk factors include hypertension as well as family history with a mother who died of a myocardial infarction at age 55. He denies chest pain or shortness of breath. He also had a small apical nodule by CT scan several years ago with recommendations of followup which was never pursued. I've offered him follow-up CT scans which she has declined because of fiscal constraints.  He presented on 09/25/2018 with a non-STEMI.  His troponin rose to 15.  He underwent cardiac catheterization by Dr. Irish Lack 09/25/2018 demonstrating an occluded dominant RCA with left-to-right collaterals.  He was stented with a synergy 3.5 mm x 20 mm long drug-eluting stent with excellent result.    He was on aspirin and Brilinta however because of shortness of breath Brilinta this was transitioned to Plavix.  Because of recurrent symptoms similar to his preintervention symptoms I performed cardiac catheterization on him 12/11/2018 in the right radial approach revealing a widely patent RCA stent with no other significant disease.  Since I did his procedure 7 months ago he is progressively improved male denying chest pain or shortness of breath.  Does admit to taking a statin drug sporadically.  Current Meds  Medication Sig  . amLODipine (NORVASC) 5 MG tablet Take 1 tablet (5 mg total) by mouth daily.  Marland Kitchen aspirin 81 MG tablet Take 1 tablet (81 mg total) by mouth daily. (Patient taking differently: Take 81 mg by mouth daily after lunch. )  . atorvastatin (LIPITOR) 80 MG tablet Take 1 tablet  (80 mg total) by mouth daily.  . clopidogrel (PLAVIX) 75 MG tablet Take 1 tablet (75 mg total) by mouth daily.  . finasteride (PROSCAR) 5 MG tablet Take 1 tablet (5 mg total) by mouth daily.  . nitroGLYCERIN (NITROSTAT) 0.4 MG SL tablet Place 1 tablet (0.4 mg total) under the tongue every 5 (five) minutes as needed for chest pain (up to 3 doses. If taking 3rd dose call 911).     No Known Allergies  Social History   Socioeconomic History  . Marital status: Single    Spouse name: Not on file  . Number of children: Not on file  . Years of education: Not on file  . Highest education level: Not on file  Occupational History  . Not on file  Social Needs  . Financial resource strain: Not on file  . Food insecurity    Worry: Not on file    Inability: Not on file  . Transportation needs    Medical: Not on file    Non-medical: Not on file  Tobacco Use  . Smoking status: Never Smoker  . Smokeless tobacco: Never Used  Substance and Sexual Activity  . Alcohol use: No    Comment: 1-2 beers a month  . Drug use: No  . Sexual activity: Not on file  Lifestyle  . Physical activity    Days per week: Not on file    Minutes per session: Not on file  . Stress: Not on file  Relationships  . Social Herbalist on  phone: Not on file    Gets together: Not on file    Attends religious service: Not on file    Active member of club or organization: Not on file    Attends meetings of clubs or organizations: Not on file    Relationship status: Not on file  . Intimate partner violence    Fear of current or ex partner: Not on file    Emotionally abused: Not on file    Physically abused: Not on file    Forced sexual activity: Not on file  Other Topics Concern  . Not on file  Social History Narrative  . Not on file     Review of Systems: General: negative for chills, fever, night sweats or weight changes.  Cardiovascular: negative for chest pain, dyspnea on exertion, edema,  orthopnea, palpitations, paroxysmal nocturnal dyspnea or shortness of breath Dermatological: negative for rash Respiratory: negative for cough or wheezing Urologic: negative for hematuria Abdominal: negative for nausea, vomiting, diarrhea, bright red blood per rectum, melena, or hematemesis Neurologic: negative for visual changes, syncope, or dizziness All other systems reviewed and are otherwise negative except as noted above.    Blood pressure (!) 147/84, pulse (!) 52, temperature (!) 96.9 F (36.1 C), height 5\' 10"  (1.778 m), weight 199 lb 12.8 oz (90.6 kg), SpO2 97 %.  General appearance: alert and no distress Neck: no adenopathy, no carotid bruit, no JVD, supple, symmetrical, trachea midline and thyroid not enlarged, symmetric, no tenderness/mass/nodules Lungs: clear to auscultation bilaterally Heart: regular rate and rhythm, S1, S2 normal, no murmur, click, rub or gallop Extremities: extremities normal, atraumatic, no cyanosis or edema Pulses: 2+ and symmetric Skin: Skin color, texture, turgor normal. No rashes or lesions Neurologic: Alert and oriented X 3, normal strength and tone. Normal symmetric reflexes. Normal coordination and gait  EKG sinus bradycardia 50 with inferior Q waves.  I personally reviewed this EKG.  ASSESSMENT AND PLAN:   Essential hypertension History of essential hypertension blood pressure measured today of 147/84.  He is on amlodipine.  Dyslipidemia, goal LDL below 70 History of dyslipidemia on high-dose statin therapy with lipid profile performed 09/25/2018 revealing total cholesterol 195, LDL of 128 and HDL of 58.  He does admit to taking his statin sporadically.  I am we did check a fasting lipid liver profile this morning.  CAD S/P percutaneous coronary angioplasty History of CAD status post PCI and drug-eluting stenting of a occluded dominant RCA 09/15/2018 by Dr. Irish Lack.  He did have left-to-right collaterals.  Because of recurrent symptoms similar  to his preintervention symptoms I performed repeat catheterization 12/11/2018 radially revealing a widely patent RCA stent with no other disease.  He remains on dual antiplatelet therapy.  Since that time his strength has improved and his shortness of breath has resolved.      Lorretta Harp MD FACP,FACC,FAHA, [facility] 08/07/2019 8:13 AM

## 2019-08-08 ENCOUNTER — Telehealth: Payer: Self-pay | Admitting: *Deleted

## 2019-08-08 DIAGNOSIS — E785 Hyperlipidemia, unspecified: Secondary | ICD-10-CM

## 2019-08-08 MED ORDER — EZETIMIBE 10 MG PO TABS: 10.0000 mg | ORAL_TABLET | Freq: Every day | ORAL | 3 refills | Status: DC

## 2019-08-08 NOTE — Telephone Encounter (Signed)
Spoke with pt, aware of dr berry's recommendations. New script sent to the pharmacy and Lab orders mailed to the pt

## 2019-08-08 NOTE — Telephone Encounter (Signed)
Follow Up  Patient returning call. Please give patient a call back.  

## 2019-08-08 NOTE — Telephone Encounter (Addendum)
Left message for pt to call   ----- Message from Lorretta Harp, MD sent at 08/08/2019  9:30 AM EDT ----- Not at goal on high-dose atorvastatin for secondary prevention.  Add Zetia 10 mg and recheck in 2 to 3 months.

## 2020-01-01 ENCOUNTER — Other Ambulatory Visit: Payer: Self-pay

## 2020-01-01 ENCOUNTER — Ambulatory Visit (INDEPENDENT_AMBULATORY_CARE_PROVIDER_SITE_OTHER): Payer: Medicare HMO | Admitting: Adult Health

## 2020-01-01 ENCOUNTER — Encounter: Payer: Self-pay | Admitting: Adult Health

## 2020-01-01 VITALS — BP 132/86 | HR 53 | Temp 97.9°F | Resp 15 | Ht 70.0 in | Wt 197.0 lb

## 2020-01-01 DIAGNOSIS — I10 Essential (primary) hypertension: Secondary | ICD-10-CM

## 2020-01-01 DIAGNOSIS — Z9861 Coronary angioplasty status: Secondary | ICD-10-CM

## 2020-01-01 DIAGNOSIS — Z0000 Encounter for general adult medical examination without abnormal findings: Secondary | ICD-10-CM | POA: Diagnosis not present

## 2020-01-01 DIAGNOSIS — I2510 Atherosclerotic heart disease of native coronary artery without angina pectoris: Secondary | ICD-10-CM

## 2020-01-01 DIAGNOSIS — N400 Enlarged prostate without lower urinary tract symptoms: Secondary | ICD-10-CM

## 2020-01-01 DIAGNOSIS — E785 Hyperlipidemia, unspecified: Secondary | ICD-10-CM

## 2020-01-01 LAB — CBC WITH DIFFERENTIAL/PLATELET
Basophils Absolute: 0 10*3/uL (ref 0.0–0.1)
Basophils Relative: 0.3 % (ref 0.0–3.0)
Eosinophils Absolute: 0.1 10*3/uL (ref 0.0–0.7)
Eosinophils Relative: 2.3 % (ref 0.0–5.0)
HCT: 45.6 % (ref 39.0–52.0)
Hemoglobin: 15.1 g/dL (ref 13.0–17.0)
Lymphocytes Relative: 19 % (ref 12.0–46.0)
Lymphs Abs: 1.2 10*3/uL (ref 0.7–4.0)
MCHC: 33.2 g/dL (ref 30.0–36.0)
MCV: 88.3 fl (ref 78.0–100.0)
Monocytes Absolute: 0.6 10*3/uL (ref 0.1–1.0)
Monocytes Relative: 10 % (ref 3.0–12.0)
Neutro Abs: 4.4 10*3/uL (ref 1.4–7.7)
Neutrophils Relative %: 68.4 % (ref 43.0–77.0)
Platelets: 174 10*3/uL (ref 150.0–400.0)
RBC: 5.16 Mil/uL (ref 4.22–5.81)
RDW: 13.5 % (ref 11.5–15.5)
WBC: 6.5 10*3/uL (ref 4.0–10.5)

## 2020-01-01 LAB — LIPID PANEL
Cholesterol: 124 mg/dL (ref 0–200)
HDL: 50.7 mg/dL (ref >39.00)
LDL Cholesterol: 65 mg/dL (ref 0–99)
NonHDL: 73.46
Total CHOL/HDL Ratio: 2
Triglycerides: 40 mg/dL (ref 0.0–149.0)
VLDL: 8 mg/dL (ref 0.0–40.0)

## 2020-01-01 LAB — PSA: PSA: 5.79 ng/mL — ABNORMAL HIGH (ref 0.10–4.00)

## 2020-01-01 LAB — COMPREHENSIVE METABOLIC PANEL
ALT: 29 U/L (ref 0–53)
AST: 27 U/L (ref 0–37)
Albumin: 4.1 g/dL (ref 3.5–5.2)
Alkaline Phosphatase: 68 U/L (ref 39–117)
BUN: 14 mg/dL (ref 6–23)
CO2: 29 mEq/L (ref 19–32)
Calcium: 9.3 mg/dL (ref 8.4–10.5)
Chloride: 109 mEq/L (ref 96–112)
Creatinine, Ser: 1.1 mg/dL (ref 0.40–1.50)
GFR: 80.36 mL/min (ref >60.00)
Glucose, Bld: 95 mg/dL (ref 70–99)
Potassium: 4.3 mEq/L (ref 3.5–5.1)
Sodium: 145 mEq/L (ref 135–145)
Total Bilirubin: 1.1 mg/dL (ref 0.2–1.2)
Total Protein: 6.7 g/dL (ref 6.0–8.3)

## 2020-01-01 LAB — TSH: TSH: 2.62 u[IU]/mL (ref 0.35–4.50)

## 2020-01-01 NOTE — Progress Notes (Signed)
Subjective:    Patient ID: Edward Freeman, male    DOB: 1951/05/27, 69 y.o.   MRN: [id number]  HPI Patient presents for yearly preventative medicine examination. He is a pleasant 69 year old male who  has a past medical history of CAD (coronary artery disease), GERD (gastroesophageal reflux disease), Hyperlipidemia, Hypertension, Migraines, Nodule of left lung, PSVT (paroxysmal supraventricular tachycardia) ([place]), Sinus bradycardia, and Syncope and collapse.  Essential Hypertension -takes Norvasc 5 mg.  He denies dizziness, lightheadedness, or chest pain.   BP Readings from Last 3 Encounters:  01/01/20 132/86  08/07/19 (!) 147/84  01/23/19 127/65   Hyperlipidemia -currently managed by cardiology with Lipitor 80 mg, Zetia 10 mg, and aspirin 81 mg daily.  He denies myalgia or fatigue Lab Results  Component Value Date   CHOL 166 08/07/2019   HDL 57 08/07/2019   LDLCALC 97 08/07/2019   TRIG 58 08/07/2019   CHOLHDL 2.9 08/07/2019    CAD - s/p percutaneous coronary angioplasty.  He has a drug-eluting stent for an occluded dominant RCA on 09/25/2018 by Dr. Irish Lack.  Did have recurrent symptoms similar to preintervention symptoms and a repeat catheterization was performed on 12/11/2018 which revealed a widely patent RCA stent with no other disease.  He is on Plavix and aspirin.  He does report that he has episodes of intermittent shortness of breath.  BPH - managed with Proscar 5 mg in the past but stopped taking this medication. He is having issues with nocturia and decreased stream. He does not want to go back on Proscar at this time.  All immunizations and health maintenance protocols were reviewed with the patient and needed orders were placed.   Appropriate screening laboratory values were ordered for the patient including screening of hyperlipidemia, renal function and hepatic function. If indicated by BPH, a PSA was ordered.  Medication reconciliation,  past medical history, social  history, problem list and allergies were reviewed in detail with the patient  Goals were established with regard to weight loss, exercise, and  diet in compliance with medications  Wt Readings from Last 3 Encounters:  01/01/20 197 lb (89.4 kg)  08/07/19 199 lb 12.8 oz (90.6 kg)  01/23/19 196 lb 3.2 oz (89 kg)     Last colonoscopy was in 2018, due to polyps he is scheduled for his next colonoscopy in 2023.   Review of Systems  Constitutional: Negative.   HENT: Negative.   Eyes: Negative.   Respiratory: Positive for shortness of breath.   Cardiovascular: Negative.   Gastrointestinal: Negative.   Endocrine: Negative.   Genitourinary: Positive for difficulty urinating.  Musculoskeletal: Negative.   Skin: Negative.   Allergic/Immunologic: Negative.   Neurological: Negative.   Hematological: Negative.   Psychiatric/Behavioral: Negative.   All other systems reviewed and are negative.  Past Medical History:  Diagnosis Date  . CAD (coronary artery disease)    a. NSTEMI 09/2018 s/p DES to RCA, EF 55-60%.  Marland Kitchen GERD (gastroesophageal reflux disease)   . Hyperlipidemia   . Hypertension   . Migraines   . Nodule of left lung   . PSVT (paroxysmal supraventricular tachycardia) ([place])   . Sinus bradycardia   . Syncope and collapse     Social History   Socioeconomic History  . Marital status: Single    Spouse name: Not on file  . Number of children: Not on file  . Years of education: Not on file  . Highest education level: Not on file  Occupational History  .  Not on file  Tobacco Use  . Smoking status: Never Smoker  . Smokeless tobacco: Never Used  Substance and Sexual Activity  . Alcohol use: No    Comment: 1-2 beers a month  . Drug use: No  . Sexual activity: Not on file  Other Topics Concern  . Not on file  Social History Narrative  . Not on file   Social Determinants of Health   Financial Resource Strain:   . Difficulty of Paying Living Expenses: Not on file    Food Insecurity:   . Worried About Charity fundraiser in the Last Year: Not on file  . Ran Out of Food in the Last Year: Not on file  Transportation Needs:   . Lack of Transportation (Medical): Not on file  . Lack of Transportation (Non-Medical): Not on file  Physical Activity:   . Days of Exercise per Week: Not on file  . Minutes of Exercise per Session: Not on file  Stress:   . Feeling of Stress : Not on file  Social Connections:   . Frequency of Communication with Friends and Family: Not on file  . Frequency of Social Gatherings with Friends and Family: Not on file  . Attends Religious Services: Not on file  . Active Member of Clubs or Organizations: Not on file  . Attends Archivist Meetings: Not on file  . Marital Status: Not on file  Intimate Partner Violence:   . Fear of Current or Ex-Partner: Not on file  . Emotionally Abused: Not on file  . Physically Abused: Not on file  . Sexually Abused: Not on file    Past Surgical History:  Procedure Laterality Date  . CORONARY STENT INTERVENTION N/A 09/25/2018   Procedure: CORONARY STENT INTERVENTION;  Surgeon: Jettie Booze, MD;  Location: [facility];  Service: Cardiovascular;  Laterality: N/A;  . LEFT HEART CATH AND CORONARY ANGIOGRAPHY N/A 09/25/2018   Procedure: LEFT HEART CATH AND CORONARY ANGIOGRAPHY;  Surgeon: Jettie Booze, MD;  Location: [facility];  Service: Cardiovascular;  Laterality: N/A;  . LEFT HEART CATH AND CORONARY ANGIOGRAPHY N/A 12/11/2018   Procedure: LEFT HEART CATH AND CORONARY ANGIOGRAPHY;  Surgeon: Lorretta Harp, MD;  Location: [facility];  Service: Cardiovascular;  Laterality: N/A;    Family History  Problem Relation Age of Onset  . Hypertension Mother   . Stroke Mother   . Alcohol abuse Father   . Diabetes Sister   . Diabetes Brother   . Diabetes Sister   . Colon cancer Neg Hx   . Esophageal cancer Neg Hx   . Rectal cancer Neg Hx   . Stomach  cancer Neg Hx     No Known Allergies  Current Outpatient Medications on File Prior to Visit  Medication Sig Dispense Refill  . amLODipine (NORVASC) 5 MG tablet Take 1 tablet (5 mg total) by mouth daily. 90 tablet 3  . aspirin 81 MG tablet Take 1 tablet (81 mg total) by mouth daily. (Patient taking differently: Take 81 mg by mouth daily after lunch. ) 30 tablet 11  . atorvastatin (LIPITOR) 80 MG tablet Take 1 tablet (80 mg total) by mouth daily. 90 tablet 3  . clopidogrel (PLAVIX) 75 MG tablet Take 1 tablet (75 mg total) by mouth daily. 90 tablet 3  . nitroGLYCERIN (NITROSTAT) 0.4 MG SL tablet Place 1 tablet (0.4 mg total) under the tongue every 5 (five) minutes as needed for chest pain (  up to 3 doses. If taking 3rd dose call 911). 25 tablet 3  . ezetimibe (ZETIA) 10 MG tablet Take 1 tablet (10 mg total) by mouth daily. 90 tablet 3  . finasteride (PROSCAR) 5 MG tablet Take 1 tablet (5 mg total) by mouth daily. (Patient not taking: Reported on 01/01/2020) 90 tablet 3   No current facility-administered medications on file prior to visit.    BP 132/86 (BP Location: Left Arm, Patient Position: Sitting)   Pulse (!) 53   Temp 97.9 F (36.6 C) (Temporal)   Resp 15   Ht 5\' 10"  (1.778 m)   Wt 197 lb (89.4 kg)   SpO2 96%   BMI 28.27 kg/m        Objective:   Physical Exam Vitals and nursing note reviewed.  Constitutional:      General: He is not in acute distress.    Appearance: Normal appearance. He is well-developed. He is not diaphoretic.  HENT:     Head: Normocephalic and atraumatic.     Right Ear: Tympanic membrane, ear canal and external ear normal. There is no impacted cerumen.     Left Ear: Tympanic membrane, ear canal and external ear normal. There is no impacted cerumen.     Nose: Nose normal. No congestion or rhinorrhea.     Mouth/Throat:     Mouth: Mucous membranes are moist.     Pharynx: Oropharynx is clear. No oropharyngeal exudate or posterior oropharyngeal erythema.    Eyes:     General:        Right eye: No discharge.        Left eye: No discharge.     Conjunctiva/sclera: Conjunctivae normal.     Pupils: Pupils are equal, round, and reactive to light.  Neck:     Thyroid: No thyromegaly.     Trachea: No tracheal deviation.  Cardiovascular:     Rate and Rhythm: Normal rate and regular rhythm.     Pulses: Normal pulses.     Heart sounds: Normal heart sounds. No murmur. No friction rub. No gallop.   Pulmonary:     Effort: Pulmonary effort is normal. No respiratory distress.     Breath sounds: Normal breath sounds. No stridor. No wheezing, rhonchi or rales.  Chest:     Chest wall: No tenderness.  Abdominal:     General: Abdomen is flat. Bowel sounds are normal. There is no distension.     Palpations: Abdomen is soft. There is no mass.     Tenderness: There is no abdominal tenderness. There is no right CVA tenderness, left CVA tenderness, guarding or rebound.     Hernia: No hernia is present.  Musculoskeletal:        General: No swelling, tenderness, deformity or signs of injury. Normal range of motion.     Right lower leg: No edema.     Left lower leg: No edema.  Lymphadenopathy:     Cervical: No cervical adenopathy.  Skin:    General: Skin is warm and dry.     Capillary Refill: Capillary refill takes less than 2 seconds.     Coloration: Skin is not jaundiced or pale.     Findings: No bruising, erythema, lesion or rash.  Neurological:     General: No focal deficit present.     Mental Status: He is alert and oriented to person, place, and time.     Cranial Nerves: No cranial nerve deficit.     Coordination: Coordination normal.  Psychiatric:        Mood and Affect: Mood normal.        Behavior: Behavior normal.        Thought Content: Thought content normal.        Judgment: Judgment normal.       Assessment & Plan:  1. Routine general medical examination at a health care facility - Encouraged routine exercise and heart healthy diet   - Follow up in one year - CBC with Differential/Platelet - Comprehensive metabolic panel - Lipid panel - TSH  2. Essential hypertension - No change in medications  - CBC with Differential/Platelet - Comprehensive metabolic panel - Lipid panel - TSH  3. CAD S/P percutaneous coronary angioplasty - Continue Lipitor and dual antiplatelet therapy.  Follow-up with cardiology as directed - CBC with Differential/Platelet - Comprehensive metabolic panel - Lipid panel - TSH  4. Dyslipidemia, goal LDL below 70 -Continue with Lipitor - CBC with Differential/Platelet - Comprehensive metabolic panel - Lipid panel - TSH  5. Benign prostatic hyperplasia without lower urinary tract symptoms  - PSA  Dorothyann Peng, NP

## 2020-01-01 NOTE — Patient Instructions (Signed)
It was great seeing you today   Please keep me informed on the knee.   We will follow up with you regarding your blood work   Please follow up in one year

## 2020-01-08 ENCOUNTER — Other Ambulatory Visit: Payer: Self-pay | Admitting: Family Medicine

## 2020-01-08 ENCOUNTER — Telehealth: Payer: Self-pay | Admitting: Adult Health

## 2020-01-08 DIAGNOSIS — R972 Elevated prostate specific antigen [PSA]: Secondary | ICD-10-CM

## 2020-01-08 NOTE — Telephone Encounter (Signed)
Spoke to the pt.  See lab results.  Nothing further needed.

## 2020-01-08 NOTE — Telephone Encounter (Signed)
Called home # and was notified the pt was not home and that I should call him on his cell.  No answer on cell.  Left a message on cell for a return call.

## 2020-01-08 NOTE — Telephone Encounter (Signed)
Pt would like to speak to you regarding a referral that Tommi Rumps had mentioned during the last visit. Pt has concerns regarding his lab work that was done a week ago. Thanks

## 2020-01-31 ENCOUNTER — Telehealth: Payer: Self-pay

## 2020-01-31 DIAGNOSIS — R972 Elevated prostate specific antigen [PSA]: Secondary | ICD-10-CM | POA: Diagnosis not present

## 2020-01-31 DIAGNOSIS — R3915 Urgency of urination: Secondary | ICD-10-CM | POA: Diagnosis not present

## 2020-01-31 DIAGNOSIS — N401 Enlarged prostate with lower urinary tract symptoms: Secondary | ICD-10-CM | POA: Diagnosis not present

## 2020-01-31 NOTE — Telephone Encounter (Signed)
Dr. Gwenlyn Found, pt has hx of NSTEMI and 100% mid RCA lesion with L>R collaterals, treated with synergy 3.5 mm x 20 mm long DES in 09/2018.  Last cath 12/11/2018 -widely patent RCA stent with otherwise normal coronary arteries and normal filling pressures.  Can Plavix be held for prostate biopsy?   Please route response back to [facility] PREOP  Thanks, Gae Bon

## 2020-01-31 NOTE — Telephone Encounter (Signed)
   [facility] Pre-operative Risk Assessment    Request for surgical clearance:  1. What type of surgery is being performed? Prostate Bx   2. When is this surgery scheduled? TBD   3. What type of clearance is required (medical clearance vs. Pharmacy clearance to hold med vs. Both)? Pharmacy  4. Are there any medications that need to be held prior to surgery and how long?Plavix   5. Practice name and name of physician performing surgery? [facility]   6. What is your office phone number [phone]    7.   What is your office fax number [phone]  8.   Anesthesia type (None, local, MAC, general) ? Not specified    Meryl Crutch 01/31/2020, 3:50 PM  _________________________________________________________________   (provider comments below)

## 2020-02-01 LAB — PSA: PSA: 9.06

## 2020-02-03 NOTE — Telephone Encounter (Signed)
Yes, anti platelet Rx can be help for prostate Bx

## 2020-02-04 NOTE — Telephone Encounter (Signed)
   Primary Cardiologist: Quay Burow, MD  Chart reviewed as part of pre-operative protocol coverage. Given past medical history and time since last visit, based on ACC/AHA guidelines, Edward Freeman would be at acceptable risk for the planned procedure without further cardiovascular testing.   He has hx of MI and stent placed to RCA in 12/2018.  It has been >1 year since stent so pt may hold Plavix for 5 days prior to procedure and resume post procedure.  When I contacted pt he had no changes since last visit with Dr. Gwenlyn Found.   I will route this recommendation to the requesting party via Epic fax function and remove from pre-op pool.  Please call with questions.  [name], NP 02/04/2020, 9:21 AM

## 2020-02-05 NOTE — Telephone Encounter (Signed)
Edward Freeman is calling to follow up in regards to the clearance due to still not receiving or hearing anything in regards to it. She is requesting the clearance be refaxed over to the fax #: [phone] instead of the original fax number given. Please advise.

## 2020-02-05 NOTE — Telephone Encounter (Signed)
I will route this again. Completed by [name], NP  yesterday.

## 2020-02-06 ENCOUNTER — Encounter: Payer: Self-pay | Admitting: Family Medicine

## 2020-03-01 ENCOUNTER — Other Ambulatory Visit: Payer: Self-pay | Admitting: Adult Health

## 2020-03-03 DIAGNOSIS — R972 Elevated prostate specific antigen [PSA]: Secondary | ICD-10-CM | POA: Diagnosis not present

## 2020-03-14 DIAGNOSIS — R972 Elevated prostate specific antigen [PSA]: Secondary | ICD-10-CM | POA: Diagnosis not present

## 2020-03-14 DIAGNOSIS — N401 Enlarged prostate with lower urinary tract symptoms: Secondary | ICD-10-CM | POA: Diagnosis not present

## 2020-03-14 DIAGNOSIS — R3915 Urgency of urination: Secondary | ICD-10-CM | POA: Diagnosis not present

## 2020-04-29 ENCOUNTER — Other Ambulatory Visit: Payer: Self-pay | Admitting: Cardiovascular Disease

## 2020-04-29 MED ORDER — ATORVASTATIN CALCIUM 80 MG PO TABS: 80.0000 mg | ORAL_TABLET | Freq: Every day | ORAL | 1 refills | Status: DC

## 2020-04-29 NOTE — Telephone Encounter (Signed)
New Message   *STAT* If patient is at the pharmacy, call can be transferred to refill team.   1. Which medications need to be refilled? (please list name of each medication and dose if known) atorvastatin (LIPITOR) 80 MG tablet  2. Which pharmacy/location (including street and city if local pharmacy) is medication to be sent to? [place], [place]  3. Do they need a 30 day or 90 day supply? 90 day

## 2020-06-25 ENCOUNTER — Other Ambulatory Visit: Payer: Self-pay | Admitting: Cardiovascular Disease

## 2020-06-25 MED ORDER — AMLODIPINE BESYLATE 5 MG PO TABS: 5.0000 mg | ORAL_TABLET | Freq: Every day | ORAL | 0 refills | Status: DC

## 2020-06-25 NOTE — Telephone Encounter (Signed)
Rx has been sent to the pharmacy electronically. ° °

## 2020-06-25 NOTE — Telephone Encounter (Signed)
*  STAT* If patient is at the pharmacy, call can be transferred to refill team.   1. Which medications need to be refilled? (please list name of each medication and dose if known)? amLODipine (NORVASC) 5 MG tablet  2. Which pharmacy/location (including street and city if local pharmacy) is medication to be sent to? [place], [place] - [address]  3. Do they need a 30 day or 90 day supply? 90 day    Patient states he has 3-4 pills left.

## 2020-07-15 ENCOUNTER — Telehealth: Payer: Self-pay | Admitting: Cardiovascular Disease

## 2020-07-15 NOTE — Telephone Encounter (Signed)
Received a call from patient he stated he has been having mild chest pain and sob off and on for the past 2 to 3 weeks.Stated he was working in yard this past Friday he became light headed and dizzy felt faint.Stated he woke up this morning and felt good.He went for a walk and started feeling sob and slight chest pain.No chest pain at present.Appointment scheduled with Coletta Memos NP 9/9 at 9:15 am.Advised to go to ED if chest pain continues.

## 2020-07-15 NOTE — Telephone Encounter (Signed)
Pt c/o Shortness Of Breath: STAT if SOB developed within the last 24 hours or pt is noticeably SOB on the phone  1. Are you currently SOB (can you hear that pt is SOB on the phone)? Yes - sounds a little SOB over the phone.   2. How long have you been experiencing SOB? 2-3 weeks  3. Are you SOB when sitting or when up moving around? Both   4. Are you currently experiencing any other symptoms? Lightheadedness and chest pain.    Pt c/o of Chest Pain: STAT if CP now or developed within 24 hours  1. Are you having CP right now? Light chest pain in the middle of his chest   2. Are you experiencing any other symptoms (ex. SOB, nausea, vomiting, sweating)? SOB - went walking this morning and breathing really hard and feels like he is giving out real quick.   3. How long have you been experiencing CP? About 2-3 weeks, light chest pain.   4. Is your CP continuous or coming and going? continuous  5. Have you taken Nitroglycerin? no ?

## 2020-07-16 NOTE — Progress Notes (Addendum)
error 

## 2020-07-17 ENCOUNTER — Ambulatory Visit: Payer: Medicare HMO | Admitting: General Practice

## 2020-07-17 ENCOUNTER — Encounter: Payer: Self-pay | Admitting: General Practice

## 2020-07-17 ENCOUNTER — Other Ambulatory Visit: Payer: Self-pay

## 2020-07-17 VITALS — BP 148/74 | HR 49 | Temp 98.4°F | Ht 70.0 in | Wt 205.4 lb

## 2020-07-17 DIAGNOSIS — R0602 Shortness of breath: Secondary | ICD-10-CM | POA: Diagnosis not present

## 2020-07-17 DIAGNOSIS — E785 Hyperlipidemia, unspecified: Secondary | ICD-10-CM | POA: Diagnosis not present

## 2020-07-17 DIAGNOSIS — R079 Chest pain, unspecified: Secondary | ICD-10-CM

## 2020-07-17 DIAGNOSIS — I10 Essential (primary) hypertension: Secondary | ICD-10-CM

## 2020-07-17 MED ORDER — CLOPIDOGREL BISULFATE 75 MG PO TABS: 75.0000 mg | ORAL_TABLET | Freq: Every day | ORAL | 3 refills | Status: DC

## 2020-07-17 NOTE — Progress Notes (Signed)
Cardiology Clinic Note   Patient Name: JEMELL TOWN Date of Encounter: 07/16/2020  Primary Care Provider:  Dorothyann Peng, NP Primary Cardiologist:  Quay Burow, MD  Patient Profile    Aleen Campi. Jacobsen 70 year old male presents the clinic today for evaluation of his shortness of breath and chest pain.  Past Medical History    Past Medical History:  Diagnosis Date  . CAD (coronary artery disease)    a. NSTEMI 09/2018 s/p DES to RCA, EF 55-60%.  Marland Kitchen GERD (gastroesophageal reflux disease)   . Hyperlipidemia   . Hypertension   . Migraines   . Nodule of left lung   . PSVT (paroxysmal supraventricular tachycardia) ([place])   . Sinus bradycardia   . Syncope and collapse    Past Surgical History:  Procedure Laterality Date  . CORONARY STENT INTERVENTION N/A 09/25/2018   Procedure: CORONARY STENT INTERVENTION;  Surgeon: Jettie Booze, MD;  Location: [facility];  Service: Cardiovascular;  Laterality: N/A;  . LEFT HEART CATH AND CORONARY ANGIOGRAPHY N/A 09/25/2018   Procedure: LEFT HEART CATH AND CORONARY ANGIOGRAPHY;  Surgeon: Jettie Booze, MD;  Location: [facility];  Service: Cardiovascular;  Laterality: N/A;  . LEFT HEART CATH AND CORONARY ANGIOGRAPHY N/A 12/11/2018   Procedure: LEFT HEART CATH AND CORONARY ANGIOGRAPHY;  Surgeon: Lorretta Harp, MD;  Location: [facility];  Service: Cardiovascular;  Laterality: N/A;    Allergies  No Known Allergies  History of Present Illness    Mr. Jacqulyn Ducking PMH of essential hypertension, unstable angina, NSTEMI, sinus bradycardia, shortness of breath, dyslipidemia, palpitations, and paroxysmal SVT.  He underwent cardiac catheterization 11/04/2003 which showed normal coronary arteries.  He also has history of apical nodule which was observed on CT several years ago which is never been reevaluated.  He has been offered repeat CT which she wishes to defer.  He presented to the emergency department 11/19 with  non-STEMI.  His troponins rose to 15.  He underwent cardiac catheterization 09/25/2018 which showed occluded dominant RCA with left-to-right collaterals.  He received a synergy stent/DES and was placed on Brilinta which was later changed to Plavix due to shortness of breath.  He again underwent cardiac catheterization on 12/11/2018 which showed a widely patent RCA stent and no other CAD.  He was last seen by Dr. Gwenlyn Found on 08/07/2019.  During that time he denied chest pain or shortness of breath.  He was taking his statin medication sporadically.  His EKG showed sinus bradycardia 50 bpm with inferior Q waves.  Contacted nurse triage line on 07/15/2020 and indicated that he had been having mild chest pain and shortness of breath off and on for the past 2-3 weeks.  He stated he had been working in his yard on Friday and became lightheaded and dizzy.  He he woke on 07/15/2020 and felt well.  He then went for a walk and felt shortness of breath and slight chest pain.  He presents the clinic today for follow-up evaluation and reported progressive shortness of breath for the last 2-3 weeks. At first it was on exertion but eventually he started feeling mild symptoms at rest. And then 5 days ago he was doing light yard work when he started feeling dizzy and sweaty. He had severe shortness of breath and 7/10 sharp substernal chest pain. Chest pain lasted 3-5 minutes before improving but he still felt like it never completely resolved. He did not think about taking a SL nitro. Before this  he was walking 1 mile a day and never noticed chest pain but was having sob. When he called in to the office Friday they told him to wait until he was seen. He also reported he had a prostate procedure at the end of March and his plavix was held 5 days before. He continued his Aspirin. Post procedure he resumed his plavix for 10 days and then ran out of it and had trouble getting refills (although he reported he called twice). He has been  taking all his other medications without issues. He denies LLE, orthopnea, PND, fever, chills, palpitations, syncope.    Home Medications    Prior to Admission medications   Medication Sig Start Date End Date Taking? Authorizing Provider  amLODipine (NORVASC) 5 MG tablet Take 1 tablet (5 mg total) by mouth daily. 06/25/20   Lorretta Harp, MD  aspirin 81 MG tablet Take 1 tablet (81 mg total) by mouth daily. Patient taking differently: Take 81 mg by mouth daily after lunch.  09/26/18   Dunn, Nedra Hai, PA-C  atorvastatin (LIPITOR) 80 MG tablet Take 1 tablet (80 mg total) by mouth daily. 04/29/20   Lorretta Harp, MD  clopidogrel (PLAVIX) 75 MG tablet Take 1 tablet (75 mg total) by mouth daily. 08/07/19   Lorretta Harp, MD  ezetimibe (ZETIA) 10 MG tablet Take 1 tablet (10 mg total) by mouth daily. 08/08/19 11/06/19  Lorretta Harp, MD  finasteride (PROSCAR) 5 MG tablet Take 1 tablet (5 mg total) by mouth daily. Patient not taking: Reported on 01/01/2020 05/30/17   Dorothyann Peng, NP  nitroGLYCERIN (NITROSTAT) 0.4 MG SL tablet Place 1 tablet (0.4 mg total) under the tongue every 5 (five) minutes as needed for chest pain (up to 3 doses. If taking 3rd dose call 911). 09/26/18   Charlie Pitter, PA-C    Family History    Family History  Problem Relation Age of Onset  . Hypertension Mother   . Stroke Mother   . Alcohol abuse Father   . Diabetes Sister   . Diabetes Brother   . Diabetes Sister   . Colon cancer Neg Hx   . Esophageal cancer Neg Hx   . Rectal cancer Neg Hx   . Stomach cancer Neg Hx    He indicated that his mother is deceased. He indicated that his father is deceased. He indicated that both of his sisters are alive. He indicated that his brother is alive. He indicated that the status of his neg hx is unknown.  Social History    Social History   Socioeconomic History  . Marital status: Single    Spouse name: Not on file  . Number of children: Not on file  . Years of  education: Not on file  . Highest education level: Not on file  Occupational History  . Not on file  Tobacco Use  . Smoking status: Never Smoker  . Smokeless tobacco: Never Used  Vaping Use  . Vaping Use: Never used  Substance and Sexual Activity  . Alcohol use: No    Comment: 1-2 beers a month  . Drug use: No  . Sexual activity: Not on file  Other Topics Concern  . Not on file  Social History Narrative  . Not on file   Social Determinants of Health   Financial Resource Strain:   . Difficulty of Paying Living Expenses: Not on file  Food Insecurity:   . Worried About Charity fundraiser in  the Last Year: Not on file  . Ran Out of Food in the Last Year: Not on file  Transportation Needs:   . Lack of Transportation (Medical): Not on file  . Lack of Transportation (Non-Medical): Not on file  Physical Activity:   . Days of Exercise per Week: Not on file  . Minutes of Exercise per Session: Not on file  Stress:   . Feeling of Stress : Not on file  Social Connections:   . Frequency of Communication with Friends and Family: Not on file  . Frequency of Social Gatherings with Friends and Family: Not on file  . Attends Religious Services: Not on file  . Active Member of Clubs or Organizations: Not on file  . Attends Archivist Meetings: Not on file  . Marital Status: Not on file  Intimate Partner Violence:   . Fear of Current or Ex-Partner: Not on file  . Emotionally Abused: Not on file  . Physically Abused: Not on file  . Sexually Abused: Not on file     Review of Systems    General:  No chills, fever, night sweats or weight changes.  Cardiovascular:  No chest pain, dyspnea on exertion, edema, orthopnea, palpitations, paroxysmal nocturnal dyspnea. Dermatological: No rash, lesions/masses Respiratory: No cough, dyspnea Urologic: No hematuria, dysuria Abdominal:   No nausea, vomiting, diarrhea, bright red blood per rectum, melena, or hematemesis Neurologic:  No  visual changes, wkns, changes in mental status. All other systems reviewed and are otherwise negative except as noted above.  Physical Exam    VS:  There were no vitals taken for this visit. , BMI There is no height or weight on file to calculate BMI. GEN: Well nourished, well developed, in no acute distress. HEENT: normal. Neck: Supple, no JVD, carotid bruits, or masses. Cardiac: RRR, no murmurs, rubs, or gallops. No clubbing, cyanosis, edema.  Radials/DP/PT 2+ and equal bilaterally.  Respiratory:  Respirations regular and unlabored, clear to auscultation bilaterally. GI: Soft, nontender, nondistended, BS + x 4. MS: no deformity or atrophy. Skin: warm and dry, no rash. Neuro:  Strength and sensation are intact. Psych: Normal affect.  Accessory Clinical Findings    Recent Labs: 01/01/2020: ALT 29; BUN 14; Creatinine, Ser 1.10; Hemoglobin 15.1; Platelets 174.0; Potassium 4.3; Sodium 145; TSH 2.62   Recent Lipid Panel    Component Value Date/Time   CHOL 124 01/01/2020 0841   CHOL 166 08/07/2019 0840   TRIG 40.0 01/01/2020 0841   HDL 50.70 01/01/2020 0841   HDL 57 08/07/2019 0840   CHOLHDL 2 01/01/2020 0841   VLDL 8.0 01/01/2020 0841   LDLCALC 65 01/01/2020 0841   LDLCALC 97 08/07/2019 0840    ECG personally reviewed by me today- SB, 49 bpm, nonspecific T wave changes inf leads.  - No acute changes  Echocardiogram 09/25/2018 Study Conclusions   - Left ventricle: The cavity size was normal. Wall thickness was  normal. Systolic function was normal. The estimated ejection  fraction was in the range of 55% to 60%. Basal inferior  hypokinesis. Doppler parameters are consistent with abnormal left  ventricular relaxation (grade 1 diastolic dysfunction). The E/e&'  ratio is <8, suggesting normal LV filling pressure.  - Mitral valve: Mildly thickened leaflets . There was trivial  regurgitation.  - Left atrium: The atrium was normal in size.  Cardiac catheterization  12/11/2018  Previously placed Mid RCA stent (unknown type) is widely patent.   History obtained from chart review.Maico Mulvehill Hayesis a 69 y.o.malewho  presents for ongoing assessment and management of HTN, hx of NSTEMI, with subsequent cardiac cath on 09/25/2018 demonstrating occluded dominant RCA with left to right collaterals, ultimately receivinglongSynergy 3.5 x 20 mm DES. He was placed on DAPT with ASA and Brilinta, with high dose statin. He was last seen by Dr. Gwenlyn Found on 11/03/2018 and was stable from cardiology standpoint.  He has recently complained of dyspnea on exertion similar to his symptoms prior to intervention.  Because of this he presents now for diagnostic coronary angiography to rule out an ischemic etiology.   IMPRESSION: Mr. Darko has a widely patent RCA stent with otherwise normal coronary arteries and normal filling pressures.  I do not think his symptoms are ischemically mediated but more likely related to Brilinta.  The sheath was removed and a TR band was placed on the right wrist to achieve patent hemostasis.  The patient left the lab in stable condition.  He will be discharged home today as an outpatient and will see Jory Sims, FNP back in the office in 7 to 10 days and me in 4 to 6 weeks.  Diagnostic Dominance: Right  Intervention    Assessment & Plan   Atypical Chest pain/preogressive sob with history of Coronary artery disease- Progressive SOB x 3 weeks with an episode of dizziness, chest pain, sob 5 days ago. Pain sounds pretty atypical. He did not take Nitro. Contacted nurse triage line on 07/15/2020 and indicated that he was noticing chest discomfort with mild physical activity.  Cardiac catheterization 12/11/2018 showed widely patent RCA stent and no other coronary artery disease. Apparently he ran out of plavix in April 2021 ten days after prostate procedure. He has not had recurrent severe sob/CP but still does not feel right. Says pain a little similar to  NSTEMI in 2019. Situation is a little complicated given symptoms and stopping plavix, however he did continue aspirin and has been taking other cardiac medications. LDL good. EKG unremarkable. Exam benign. Recommended he restart plavix and encouraged to take SL nitro for recurrent chest pain and monitor response. Also will check an echo (last one in 2019 with normal EF).  If there is any abnormality on the echo he might need cardiac catheterization. We will see him back in 1 month for echo results and to reassess symptoms. Recommended he be very cautious if he returns to walking.  Heart healthy low-sodium diet-salty 6 given  Essential hypertension-BP today a little high. He does not take it at home. Continue amlodipine. Can re-assess at follow-up.  Continue amlodipine, Heart healthy low-sodium diet-salty 6 given Increase physical activity as tolerated  Hyperlipidemia-01/01/2020: Cholesterol 124; HDL 50.70; LDL Cholesterol 65; Triglycerides 40.0; VLDL 8.0 Continue atorvastatin, Zetia Heart healthy low-sodium diet-salty 6 given Increase physical activity as tolerated  Disposition: Follow-up with Coletta Memos or Dr. Gwenlyn Found after echocardiogram   Matelyn Antonelli Kathlen Mody, PA-C    07/16/2020, 4:13 PM [address] Office [phone] Fax [phone]  Notice: This dictation was prepared with Dragon dictation along with smaller phrase technology. Any transcriptional errors that result from this process are unintentional and may not be corrected upon review.

## 2020-07-17 NOTE — Patient Instructions (Signed)
Medication Instructions:  Continue current medications  *If you need a refill on your cardiac medications before your next appointment, please call your pharmacy*   Lab Work: None Ordered If you have labs (blood work) drawn today and your tests are completely normal, you will receive your results only by: Marland Kitchen MyChart Message (if you have MyChart) OR . A paper copy in the mail If you have any lab test that is abnormal or we need to change your treatment, we will call you to review the results.   Testing/Procedures: Your physician has requested that you have an echocardiogram. Echocardiography is a painless test that uses sound waves to create images of your heart. It provides your doctor with information about the size and shape of your heart and how well your heart's chambers and valves are working. This procedure takes approximately one hour. There are no restrictions for this procedure.   Follow-Up: At [facility], you and your health needs are our priority.  As part of our continuing mission to provide you with exceptional heart care, we have created designated Provider Care Teams.  These Care Teams include your primary Cardiologist (physician) and Advanced Practice Providers (APPs -  Physician Assistants and Nurse Practitioners) who all work together to provide you with the care you need, when you need it.  We recommend signing up for the patient portal called "MyChart".  Sign up information is provided on this After Visit Summary.  MyChart is used to connect with patients for Virtual Visits (Telemedicine).  Patients are able to view lab/test results, encounter notes, upcoming appointments, etc.  Non-urgent messages can be sent to your provider as well.   To learn more about what you can do with MyChart, go to [email or web address].    Your next appointment:   1 month(s)  The format for your next appointment:   In Person  Provider:   You may see Quay Burow, MD or one of the  following Advanced Practice Providers on your designated Care Team:    Kerin Ransom, PA-C  [place], [place]  Coletta Memos, [place]

## 2020-07-17 NOTE — Addendum Note (Signed)
Addended by: Vennie Homans on: 07/17/2020 11:50 AM   Modules accepted: Orders

## 2020-08-01 NOTE — Progress Notes (Signed)
Cardiology Office Note:    Date:  08/13/2020   ID:  Edward Freeman, DOB Oct 19, 1951, MRN [id number]  PCP:  Dorothyann Peng, NP  Cardiologist:  Quay Burow, MD  Electrophysiologist:  None   Referring MD: Dorothyann Peng, NP   Chief Complaint: follow-up of chest pain and shortness of breath  History of Present Illness:    Edward Freeman is a 69 y.o. male with a history of CAD with NSTEMI in 09/2018 s/p DES to RCA, paroxysmal SVT, bradycardia, hypertension, hyperlipidemia, GERD, migraines, and lung nodule who is followed by Dr. Gwenlyn Found and presents today.   Patient was admitted in 09/2018 with NSTEMI. Cardiac catheterization showed 100% stenosis of mid RCA with left to right collaterals. Other vessels were clean. RCA lesion was treated with DES. Echo showed LVEF of 55-60% with hypokinesis of basal inferior wall and grade 1 diastolic dysfunction. Patient was placed on DAPT with Brilinta and Aspirin; however, Brilinta later switched to Plavix due to shortness of breath. He had another cardiac catheterization in 12/2018 for recurrent chest pain which showed widely patent RCA stent with no other CAD.   Patient was recently seen in the office by Cadence Furth, PA, on 07/18/2019 for evaluation of shortness of breath and chest pain. Patient noted mild chest pain and shortness of breath on and off for the past 2-3 weeks. He also noted an episode of dizziness and sweating while working in the yard followed by severe shortness of breath and 7/10 sharp substernal chest pain. He also noted that he had stopped taking Plavix after having a prostate procedure in 01/2020. He was advised to restart Plavix and repeat Echo was ordered. He was advised to follow-up in 1 month.  Echo showed LVEF of 55-60% with basal inferior hypokinesis, mild LVH, and mildly dilated ascending aorta measuring 42 mm.  Patient presents today for follow-up. Patient states that he feels a little better since last visit but continues to have some  mild shortness of breath as well as some very atypical sounding chest pain. He describes chest pain as substernal non-radiating mild pain that usually occurs when he takes a deep breath. He push mows his lawn and denies any pain with this. He describes mild shortness of breath with activity and at rest but denies any orthopnea, PND, or edema. No hypoxia, recent travel/surgery or signs of DVT to raise concern for PE. He states he occasionally feels a flutter in his chest when he takes a deep breath but it does not last long - sounds like it may be a premature beat. No recurrent lightheadedness, dizziness, or near syncope. His biggest complaint today though is fatigue and decreased energy. Symptoms are somewhat similar to how he felt before prior PCI. No recent fevers or illness. No abnormal bleeding in urine or stools.   Past Medical History:  Diagnosis Date  . CAD (coronary artery disease)    a. NSTEMI 09/2018 s/p DES to RCA, EF 55-60%.  Marland Kitchen GERD (gastroesophageal reflux disease)   . Hyperlipidemia   . Hypertension   . Migraines   . Nodule of left lung   . PSVT (paroxysmal supraventricular tachycardia) ([place])   . Sinus bradycardia   . Syncope and collapse     Past Surgical History:  Procedure Laterality Date  . CORONARY STENT INTERVENTION N/A 09/25/2018   Procedure: CORONARY STENT INTERVENTION;  Surgeon: Jettie Booze, MD;  Location: [facility];  Service: Cardiovascular;  Laterality: N/A;  . LEFT HEART CATH AND CORONARY  ANGIOGRAPHY N/A 09/25/2018   Procedure: LEFT HEART CATH AND CORONARY ANGIOGRAPHY;  Surgeon: Jettie Booze, MD;  Location: [facility];  Service: Cardiovascular;  Laterality: N/A;  . LEFT HEART CATH AND CORONARY ANGIOGRAPHY N/A 12/11/2018   Procedure: LEFT HEART CATH AND CORONARY ANGIOGRAPHY;  Surgeon: Lorretta Harp, MD;  Location: [facility] [facility];  Service: Cardiovascular;  Laterality: N/A;    Current Medications: Current Meds  Medication Sig   . amLODipine (NORVASC) 5 MG tablet Take 1 tablet (5 mg total) by mouth daily.  Marland Kitchen aspirin 81 MG tablet Take 1 tablet (81 mg total) by mouth daily. (Patient taking differently: Take 81 mg by mouth daily after lunch. )  . atorvastatin (LIPITOR) 80 MG tablet Take 1 tablet (80 mg total) by mouth daily.  . clopidogrel (PLAVIX) 75 MG tablet Take 1 tablet (75 mg total) by mouth daily.  . nitroGLYCERIN (NITROSTAT) 0.4 MG SL tablet Place 1 tablet (0.4 mg total) under the tongue every 5 (five) minutes as needed for chest pain (up to 3 doses. If taking 3rd dose call 911).  . tamsulosin (FLOMAX) 0.4 MG CAPS capsule Take 1 capsule by mouth daily.     Allergies:   Patient has no known allergies.   Social History   Socioeconomic History  . Marital status: Single    Spouse name: Not on file  . Number of children: Not on file  . Years of education: Not on file  . Highest education level: Not on file  Occupational History  . Not on file  Tobacco Use  . Smoking status: Never Smoker  . Smokeless tobacco: Never Used  Vaping Use  . Vaping Use: Never used  Substance and Sexual Activity  . Alcohol use: No    Comment: 1-2 beers a month  . Drug use: No  . Sexual activity: Not on file  Other Topics Concern  . Not on file  Social History Narrative  . Not on file   Social Determinants of Health   Financial Resource Strain:   . Difficulty of Paying Living Expenses: Not on file  Food Insecurity:   . Worried About Charity fundraiser in the Last Year: Not on file  . Ran Out of Food in the Last Year: Not on file  Transportation Needs:   . Lack of Transportation (Medical): Not on file  . Lack of Transportation (Non-Medical): Not on file  Physical Activity:   . Days of Exercise per Week: Not on file  . Minutes of Exercise per Session: Not on file  Stress:   . Feeling of Stress : Not on file  Social Connections:   . Frequency of Communication with Friends and Family: Not on file  . Frequency of  Social Gatherings with Friends and Family: Not on file  . Attends Religious Services: Not on file  . Active Member of Clubs or Organizations: Not on file  . Attends Archivist Meetings: Not on file  . Marital Status: Not on file     Family History: The patient's family history includes Alcohol abuse in his father; Diabetes in his brother, sister, and sister; Hypertension in his mother; Stroke in his mother. There is no history of Colon cancer, Esophageal cancer, Rectal cancer, or Stomach cancer.  ROS:   Please see the history of present illness.     EKGs/Labs/Other Studies Reviewed:    The following studies were reviewed today:  Left Heart Catheterization 12/11/2018: Impressions: Mr. Wettstein has a  widely patent RCA stent with otherwise normal coronary arteries and normal filling pressures.  I do not think his symptoms are ischemically mediated but more likely related to Brilinta.  The sheath was removed and a TR band was placed on the right wrist to achieve patent hemostasis.  The patient left the lab in stable condition.  He will be discharged home today as an outpatient and will see Jory Sims, FNP back in the office in 7 to 10 days and me in 4 to 6 weeks.  Diagnostic Dominance: Right  _______________  Echocardiogram 08/04/2020: Impressions: 1. Basal inferior hypokinesis . Left ventricular ejection fraction, by  estimation, is 55 to 60%. The left ventricle has normal function. The left  ventricular internal cavity size was mildly dilated. There is mild left  ventricular hypertrophy. Left  ventricular diastolic parameters are indeterminate.  2. Right ventricular systolic function is normal. The right ventricular  size is normal.  3. The mitral valve is abnormal. Mild mitral valve regurgitation.  4. The aortic valve is normal in structure. Aortic valve regurgitation is  trivial.  5. Aortic dilatation noted. There is mild dilatation of the ascending  aorta,  measuring 42 mm.   Comparison(s): The left ventricular function is unchanged.  EKG:  EKG ordered today. EKG personally reviewed and demonstrates sinus bradycardia, rate 49 bpm, with probable left atrial enlargement and non-specific ST/T changes.  Recent Labs: 01/01/2020: ALT 29; BUN 14; Creatinine, Ser 1.10; Hemoglobin 15.1; Platelets 174.0; Potassium 4.3; Sodium 145; TSH 2.62  Recent Lipid Panel    Component Value Date/Time   CHOL 124 01/01/2020 0841   CHOL 166 08/07/2019 0840   TRIG 40.0 01/01/2020 0841   HDL 50.70 01/01/2020 0841   HDL 57 08/07/2019 0840   CHOLHDL 2 01/01/2020 0841   VLDL 8.0 01/01/2020 0841   LDLCALC 65 01/01/2020 0841   LDLCALC 97 08/07/2019 0840    Physical Exam:    Vital Signs: BP 140/80   Pulse (!) 49   Ht 5\' 10"  (1.778 m)   Wt 206 lb 3.2 oz (93.5 kg)   SpO2 98%   BMI 29.59 kg/m     Wt Readings from Last 3 Encounters:  08/13/20 206 lb 3.2 oz (93.5 kg)  07/17/20 205 lb 6.4 oz (93.2 kg)  01/01/20 197 lb (89.4 kg)     General: 69 y.o. male in no acute distress. HEENT: Normocephalic and atraumatic. Sclera clear. EOMs intact. Neck: Supple. No carotid bruits. No JVD. Heart: Bradycardic with normal rhythm. Distinct S1 and S2. No murmurs, gallops, or rubs. Radial pulses 2+ and equal bilaterally. Lungs: No increased work of breathing. Clear to ausculation bilaterally. No wheezes, rhonchi, or rales.  Abdomen: Soft, non-distended, and non-tender to palpation. Bowel sounds present. Extremities: No lower extremity edema.    Skin: Warm and dry. Neuro: Alert and oriented x3. No focal deficits. Psych: Normal affect. Responds appropriately.  Assessment:    1. Dyspnea, unspecified type   2. Atypical chest pain   3. Coronary artery disease involving native coronary artery of native heart without angina pectoris   4. Fatigue, unspecified type   5. Ascending aorta dilatation (HCC)   6. Primary hypertension   7. Hyperlipidemia, unspecified hyperlipidemia  type   8. Sinus bradycardia     Plan:    Atypical Chest Pain Dyspnea History of CAD  - History of NSTEMI in 07/2018 s/p DES to RCA. Repeat cath in 12/2018 showed patent RCA stent and no other CAD.  - Patient seen in  office last month for exertional chest pain and shortness of breath. Continues to report very atypical sounding pain (pleuritic) as well as some mild shortness of breath.  - Recent Echo showed  LVEF of 55-60% with basal inferior hypokinesis and mild LVH. Wall motion abnormality is not new. - Patient sounds very atypical but given persistent dyspnea will order exercise Myoview. - I have very low suspicion for PE - patient is not hypoxic or tachycardic. No recent travel/surgeries and no signs of DVT. So will hold off on evaluation for this. - Currently on DAPT with Aspirin 81mg  daily and Plavix 75mg  daily. Patient is now 2 years out from last PCI. Will discuss with Dr. Gwenlyn Found if DAPT needs to be continued.  Fatigue - Patient biggest complaint today is fatigue and decreased energy level. He easily tires out with activity. - Will check TSH and CBC. - Wonder if chronotropic incompetence could be playing a role. Will evaluate for this with exercise nuclear stress test.  Mildly Dilated Ascending Aorta - Echo showed mildly dilated ascending aorta measuring 42 mm. - May consider chest CTA for further evaluation. Can discuss at follow-up visit.  Hypertension - BP mildly elevated today at 140/80. - Discussed increasing Amlodipine but patient would like to hold off for now. Therefore, will continue Amlodipine 5mg  daily. - Advised patient to buy home BP monitor and keep BP/heart rate log for 2 weeks and then send [place] the results.  Hyperlipidemia - Lipid panel from 12/2019: Total Cholesterol 124, Triglycerides 40, HDL 50.70, LDL 65.  - LDL goal <70 given CAD.  - Continue Lipitor 80mg  daily and Zetia 10mg  daily.  Sinus Bradycardia - Chronic and stable. Asymptomatic with this. - Rate 49  bpm today.  - Given given fatigue and decreased energy, I wonder if he could have some chronotropic incompetence. Exercise stress test will help evaluate for this.   Disposition: Follow up with Dr. Gwenlyn Found after stress    Medication Adjustments/Labs and Tests Ordered: Current medicines are reviewed at length with the patient today.  Concerns regarding medicines are outlined above.  Orders Placed This Encounter  Procedures  . TSH  . CBC  . Myocardial Perfusion Imaging  . EKG 12-Lead   No orders of the defined types were placed in this encounter.   Patient Instructions  Medication Instructions:  No changes *If you need a refill on your cardiac medications before your next appointment, please call your pharmacy*   Lab Work: TSH and CBC today If you have labs (blood work) drawn today and your tests are completely normal, you will receive your results only by: Marland Kitchen MyChart Message (if you have MyChart) OR . A paper copy in the mail If you have any lab test that is abnormal or we need to change your treatment, we will call you to review the results.   Testing/Procedures: Your physician has requested that you have a lexiscan myoview. A cardiac stress test is a cardiological test that measures the heart's ability to respond to external stress in a controlled clinical environment. The stress response is induced by intravenous pharmacological stimulation.     Follow-Up: At [facility], you and your health needs are our priority.  As part of our continuing mission to provide you with exceptional heart care, we have created designated Provider Care Teams.  These Care Teams include your primary Cardiologist (physician) and Advanced Practice Providers (APPs -  Physician Assistants and Nurse Practitioners) who all work together to provide you with the care you  need, when you need it.  We recommend signing up for the patient portal called "MyChart".  Sign up information is provided on this  After Visit Summary.  MyChart is used to connect with patients for Virtual Visits (Telemedicine).  Patients are able to view lab/test results, encounter notes, upcoming appointments, etc.  Non-urgent messages can be sent to your provider as well.   To learn more about what you can do with MyChart, go to [email or web address].    Your next appointment:   1 month(s)  The format for your next appointment:   In Person  Provider:   You may see Quay Burow, MD or one of the following Advanced Practice Providers on your designated Care Team:    Sande Rives, PA-C  Coletta Memos, FNP    Other Instructions none     Signed, Eppie Gibson  08/13/2020 11:28 AM    [place]

## 2020-08-04 ENCOUNTER — Ambulatory Visit (HOSPITAL_COMMUNITY): Payer: Medicare HMO | Attending: Internal Medicine

## 2020-08-04 ENCOUNTER — Other Ambulatory Visit: Payer: Self-pay

## 2020-08-04 DIAGNOSIS — R0602 Shortness of breath: Secondary | ICD-10-CM | POA: Diagnosis not present

## 2020-08-04 DIAGNOSIS — R079 Chest pain, unspecified: Secondary | ICD-10-CM | POA: Diagnosis not present

## 2020-08-04 LAB — ECHOCARDIOGRAM COMPLETE
Area-P 1/2: 2.5 cm2
P 1/2 time: 664 ms
S' Lateral: 3.7 cm

## 2020-08-06 ENCOUNTER — Other Ambulatory Visit: Payer: Self-pay

## 2020-08-06 DIAGNOSIS — R0602 Shortness of breath: Secondary | ICD-10-CM

## 2020-08-13 ENCOUNTER — Telehealth: Payer: Self-pay

## 2020-08-13 ENCOUNTER — Encounter: Payer: Self-pay | Admitting: Cardiovascular Disease

## 2020-08-13 ENCOUNTER — Encounter: Payer: Self-pay | Admitting: Student

## 2020-08-13 ENCOUNTER — Other Ambulatory Visit: Payer: Self-pay

## 2020-08-13 ENCOUNTER — Ambulatory Visit: Payer: Medicare HMO | Admitting: Student

## 2020-08-13 VITALS — BP 140/80 | HR 49 | Ht 70.0 in | Wt 206.2 lb

## 2020-08-13 DIAGNOSIS — I77810 Thoracic aortic ectasia: Secondary | ICD-10-CM

## 2020-08-13 DIAGNOSIS — R0789 Other chest pain: Secondary | ICD-10-CM

## 2020-08-13 DIAGNOSIS — I2510 Atherosclerotic heart disease of native coronary artery without angina pectoris: Secondary | ICD-10-CM | POA: Diagnosis not present

## 2020-08-13 DIAGNOSIS — E785 Hyperlipidemia, unspecified: Secondary | ICD-10-CM

## 2020-08-13 DIAGNOSIS — R5383 Other fatigue: Secondary | ICD-10-CM | POA: Diagnosis not present

## 2020-08-13 DIAGNOSIS — R001 Bradycardia, unspecified: Secondary | ICD-10-CM | POA: Diagnosis not present

## 2020-08-13 DIAGNOSIS — R0600 Dyspnea, unspecified: Secondary | ICD-10-CM | POA: Diagnosis not present

## 2020-08-13 DIAGNOSIS — I10 Essential (primary) hypertension: Secondary | ICD-10-CM

## 2020-08-13 DIAGNOSIS — R0602 Shortness of breath: Secondary | ICD-10-CM | POA: Diagnosis not present

## 2020-08-13 LAB — CBC
Hematocrit: 45.6 % (ref 37.5–51.0)
Hemoglobin: 15.3 g/dL (ref 13.0–17.7)
MCH: 29.3 pg (ref 26.6–33.0)
MCHC: 33.6 g/dL (ref 31.5–35.7)
MCV: 87 fL (ref 79–97)
Platelets: 182 10*3/uL (ref 150–450)
RBC: 5.22 x10E6/uL (ref 4.14–5.80)
RDW: 12.7 % (ref 11.6–15.4)
WBC: 5.1 10*3/uL (ref 3.4–10.8)

## 2020-08-13 LAB — TSH: TSH: 1.77 u[IU]/mL (ref 0.450–4.500)

## 2020-08-13 NOTE — Telephone Encounter (Signed)
Left message to call back to schedule pre- exercise stress test. Test is 10/19, needs covid screening 10/15.

## 2020-08-13 NOTE — Patient Instructions (Signed)
Medication Instructions:  No changes *If you need a refill on your cardiac medications before your next appointment, please call your pharmacy*   Lab Work: TSH and CBC today If you have labs (blood work) drawn today and your tests are completely normal, you will receive your results only by: Marland Kitchen MyChart Message (if you have MyChart) OR . A paper copy in the mail If you have any lab test that is abnormal or we need to change your treatment, we will call you to review the results.   Testing/Procedures: Your physician has requested that you have a lexiscan myoview. A cardiac stress test is a cardiological test that measures the heart's ability to respond to external stress in a controlled clinical environment. The stress response is induced by intravenous pharmacological stimulation.     Follow-Up: At [facility], you and your health needs are our priority.  As part of our continuing mission to provide you with exceptional heart care, we have created designated Provider Care Teams.  These Care Teams include your primary Cardiologist (physician) and Advanced Practice Providers (APPs -  Physician Assistants and Nurse Practitioners) who all work together to provide you with the care you need, when you need it.  We recommend signing up for the patient portal called "MyChart".  Sign up information is provided on this After Visit Summary.  MyChart is used to connect with patients for Virtual Visits (Telemedicine).  Patients are able to view lab/test results, encounter notes, upcoming appointments, etc.  Non-urgent messages can be sent to your provider as well.   To learn more about what you can do with MyChart, go to [email or web address].    Your next appointment:   1 month(s)  The format for your next appointment:   In Person  Provider:   You may see Quay Burow, MD or one of the following Advanced Practice Providers on your designated Care Team:    [place], PA-C  Coletta Memos, FNP    Other Instructions none

## 2020-08-22 ENCOUNTER — Telehealth (HOSPITAL_COMMUNITY): Payer: Self-pay | Admitting: *Deleted

## 2020-08-22 ENCOUNTER — Other Ambulatory Visit (HOSPITAL_COMMUNITY)
Admission: RE | Admit: 2020-08-22 | Discharge: 2020-08-22 | Disposition: A | Discharge status: Home / Self Care | Payer: Medicare HMO | Source: Ambulatory Visit | Attending: Cardiovascular Disease | Admitting: Cardiovascular Disease

## 2020-08-22 DIAGNOSIS — Z01812 Encounter for preprocedural laboratory examination: Secondary | ICD-10-CM | POA: Diagnosis not present

## 2020-08-22 DIAGNOSIS — Z20822 Contact with and (suspected) exposure to covid-19: Secondary | ICD-10-CM | POA: Diagnosis not present

## 2020-08-22 LAB — SARS CORONAVIRUS 2 (TAT 6-24 HRS): SARS Coronavirus 2: NEGATIVE

## 2020-08-22 NOTE — Telephone Encounter (Signed)
Close encounter 

## 2020-08-26 ENCOUNTER — Other Ambulatory Visit: Payer: Self-pay

## 2020-08-26 ENCOUNTER — Ambulatory Visit (HOSPITAL_COMMUNITY)
Admission: RE | Admit: 2020-08-26 | Discharge: 2020-08-26 | Disposition: A | Discharge status: Home / Self Care | Payer: Medicare HMO | Source: Ambulatory Visit | Attending: Cardiovascular Disease | Admitting: Cardiovascular Disease

## 2020-08-26 DIAGNOSIS — R0600 Dyspnea, unspecified: Secondary | ICD-10-CM

## 2020-08-26 DIAGNOSIS — R0789 Other chest pain: Secondary | ICD-10-CM

## 2020-08-26 LAB — MYOCARDIAL PERFUSION IMAGING
LV dias vol: 141 mL (ref 62–150)
LV sys vol: 71 mL
Peak HR: 109 {beats}/min
Rest HR: 47 {beats}/min
SDS: 1
SRS: 5
SSS: 6
TID: 1.03

## 2020-08-26 MED ORDER — TECHNETIUM TC 99M TETROFOSMIN IV KIT
9.7000 | PACK | Freq: Once | INTRAVENOUS | Status: AC | PRN
  Administered 2020-08-26: 9.7 via INTRAVENOUS
  Filled 2020-08-26: qty 10

## 2020-08-26 MED ORDER — TECHNETIUM TC 99M TETROFOSMIN IV KIT
30.6000 | PACK | Freq: Once | INTRAVENOUS | Status: AC | PRN
  Administered 2020-08-26: 30.6 via INTRAVENOUS
  Filled 2020-08-26: qty 31

## 2020-08-26 MED ORDER — REGADENOSON 0.4 MG/5ML IV SOLN
0.4000 mg | Freq: Once | INTRAVENOUS | Status: AC
  Administered 2020-08-26: 0.4 mg via INTRAVENOUS

## 2020-08-26 MED ORDER — AMINOPHYLLINE 25 MG/ML IV SOLN
75.0000 mg | Freq: Once | INTRAVENOUS | Status: AC
  Administered 2020-08-26: 75 mg via INTRAVENOUS

## 2020-08-27 ENCOUNTER — Other Ambulatory Visit: Payer: Self-pay | Admitting: Cardiovascular Disease

## 2020-08-27 DIAGNOSIS — E785 Hyperlipidemia, unspecified: Secondary | ICD-10-CM

## 2020-08-27 MED ORDER — EZETIMIBE 10 MG PO TABS: 10.0000 mg | ORAL_TABLET | Freq: Every day | ORAL | 3 refills | Status: DC

## 2020-08-27 NOTE — Telephone Encounter (Signed)
*  STAT* If patient is at the pharmacy, call can be transferred to refill team.   1. Which medications need to be refilled? (please list name of each medication and dose if known)  ezetimibe (ZETIA) 10 MG tablet(Expired)  2. Which pharmacy/location (including street and city if local pharmacy) is medication to be sent to?  [place], [place] - [address]  3. Do they need a 30 day or 90 day supply?  90 day supply.   Patient says he needs to speak with Dr. before they put another prescription in.

## 2020-09-12 ENCOUNTER — Other Ambulatory Visit: Payer: Self-pay

## 2020-09-12 ENCOUNTER — Encounter: Payer: Self-pay | Admitting: Cardiovascular Disease

## 2020-09-12 ENCOUNTER — Ambulatory Visit: Payer: Medicare HMO | Admitting: Cardiovascular Disease

## 2020-09-12 DIAGNOSIS — I10 Essential (primary) hypertension: Secondary | ICD-10-CM

## 2020-09-12 DIAGNOSIS — E785 Hyperlipidemia, unspecified: Secondary | ICD-10-CM | POA: Diagnosis not present

## 2020-09-12 DIAGNOSIS — I214 Non-ST elevation (NSTEMI) myocardial infarction: Secondary | ICD-10-CM

## 2020-09-12 MED ORDER — AMLODIPINE BESYLATE 5 MG PO TABS
5.0000 mg | ORAL_TABLET | Freq: Every day | ORAL | 30 refills | Status: DC
Start: 2020-09-12 — End: 2020-09-12

## 2020-09-12 MED ORDER — AMLODIPINE BESYLATE 5 MG PO TABS
5.0000 mg | ORAL_TABLET | Freq: Every day | ORAL | 30 refills | Status: DC
Start: 2020-09-12 — End: 2020-12-26

## 2020-09-12 NOTE — Assessment & Plan Note (Signed)
History of essential hypertension with blood pressure measured today 140/72.  He is on amlodipine.

## 2020-09-12 NOTE — Progress Notes (Signed)
09/12/2020 Edward Freeman   04-22-1951  [id number]  Primary Physician Carlisle Cater, Tommi Rumps, NP Primary Cardiologist: Lorretta Harp MD [name], [place]  HPI:  Edward Freeman is a 69 y.o.  married African American male father of 2, grandfather to 2 grandchildren he works doing Furniture conservator/restorer.I last saw him in the office9/29/2020. I performed cardiac catheterization on him 11/04/03 which was completely normal. His cardiac risk factors include hypertension as well as family history with a mother who died of a myocardial infarction at age 56. He denies chest pain or shortness of breath. He also had a small apical nodule by CT scan several years ago with recommendations of followup which was never pursued. I've offered him follow-up CT scans which she has declined because of fiscal constraints.  He presented on 09/25/2018 with a non-STEMI. His troponin rose to 15. He underwent cardiac catheterization by Dr. Irish Lack 09/25/2018 demonstrating an occluded dominant RCA with left-to-right collaterals. He was stented with a synergy 3.5 mm x 20 mm long drug-eluting stent with excellent result.   He was on aspirin and Brilinta however because of shortness of breath Brilinta this was transitioned to Plavix.  Because of recurrent symptoms similar to his preintervention symptoms I performed cardiac catheterization on him 12/11/2018 in the right radial approach revealing a widely patent RCA stent with no other significant disease.  Since I saw him a year ago he did complain of chest pain and shortness of breath back in September.  He had a 2D echo and a Myoview stress test both of which were unremarkable.  Symptoms have since resolved.   Current Meds  Medication Sig  . amLODipine (NORVASC) 5 MG tablet Take 1 tablet (5 mg total) by mouth daily.  Marland Kitchen aspirin 81 MG tablet Take 1 tablet (81 mg total) by mouth daily. (Patient taking differently: Take 81 mg by mouth daily after lunch. )  .  atorvastatin (LIPITOR) 80 MG tablet Take 1 tablet (80 mg total) by mouth daily.  . clopidogrel (PLAVIX) 75 MG tablet Take 1 tablet (75 mg total) by mouth daily.  Marland Kitchen ezetimibe (ZETIA) 10 MG tablet Take 1 tablet (10 mg total) by mouth daily.  . nitroGLYCERIN (NITROSTAT) 0.4 MG SL tablet Place 1 tablet (0.4 mg total) under the tongue every 5 (five) minutes as needed for chest pain (up to 3 doses. If taking 3rd dose call 911).  . tamsulosin (FLOMAX) 0.4 MG CAPS capsule Take 1 capsule by mouth daily.  . [DISCONTINUED] amLODipine (NORVASC) 5 MG tablet Take 1 tablet (5 mg total) by mouth daily.  . [DISCONTINUED] amLODipine (NORVASC) 5 MG tablet Take 1 tablet (5 mg total) by mouth daily.     No Known Allergies  Social History   Socioeconomic History  . Marital status: Single    Spouse name: Not on file  . Number of children: Not on file  . Years of education: Not on file  . Highest education level: Not on file  Occupational History  . Not on file  Tobacco Use  . Smoking status: Never Smoker  . Smokeless tobacco: Never Used  Vaping Use  . Vaping Use: Never used  Substance and Sexual Activity  . Alcohol use: No    Comment: 1-2 beers a month  . Drug use: No  . Sexual activity: Not on file  Other Topics Concern  . Not on file  Social History Narrative  . Not on file   Social Determinants of Health  Financial Resource Strain:   . Difficulty of Paying Living Expenses: Not on file  Food Insecurity:   . Worried About Charity fundraiser in the Last Year: Not on file  . Ran Out of Food in the Last Year: Not on file  Transportation Needs:   . Lack of Transportation (Medical): Not on file  . Lack of Transportation (Non-Medical): Not on file  Physical Activity:   . Days of Exercise per Week: Not on file  . Minutes of Exercise per Session: Not on file  Stress:   . Feeling of Stress : Not on file  Social Connections:   . Frequency of Communication with Friends and Family: Not on file   . Frequency of Social Gatherings with Friends and Family: Not on file  . Attends Religious Services: Not on file  . Active Member of Clubs or Organizations: Not on file  . Attends Archivist Meetings: Not on file  . Marital Status: Not on file  Intimate Partner Violence:   . Fear of Current or Ex-Partner: Not on file  . Emotionally Abused: Not on file  . Physically Abused: Not on file  . Sexually Abused: Not on file     Review of Systems: General: negative for chills, fever, night sweats or weight changes.  Cardiovascular: negative for chest pain, dyspnea on exertion, edema, orthopnea, palpitations, paroxysmal nocturnal dyspnea or shortness of breath Dermatological: negative for rash Respiratory: negative for cough or wheezing Urologic: negative for hematuria Abdominal: negative for nausea, vomiting, diarrhea, bright red blood per rectum, melena, or hematemesis Neurologic: negative for visual changes, syncope, or dizziness All other systems reviewed and are otherwise negative except as noted above.    Blood pressure 140/72, pulse (!) 49, height 5\' 10"  (1.778 m), weight 206 lb (93.4 kg), SpO2 99 %.  General appearance: alert and no distress Neck: no adenopathy, no carotid bruit, no JVD, supple, symmetrical, trachea midline and thyroid not enlarged, symmetric, no tenderness/mass/nodules Lungs: clear to auscultation bilaterally Heart: regular rate and rhythm, S1, S2 normal, no murmur, click, rub or gallop Extremities: extremities normal, atraumatic, no cyanosis or edema Pulses: 2+ and symmetric  L brachial 2+ R brachial 2+  L radial 2+ R radial 2+  L inguinal 2+ R inguinal 2+  L popliteal 2+ R popliteal 2+  L posterior tibial 2+ R posterior tibial 2+  L dorsalis pedis 2+ R dorsalis pedis 2+   Skin: Skin color, texture, turgor normal. No rashes or lesions Neurologic: Alert and oriented X 3, normal strength and tone. Normal symmetric reflexes. Normal coordination and  gait  EKG not performed today  ASSESSMENT AND PLAN:   Essential hypertension History of essential hypertension with blood pressure measured today 140/72.  He is on amlodipine.  Dyslipidemia, goal LDL below 70 History of dyslipidemia on statin therapy with lipid profile performed 01/01/2020 revealing total cholesterol 124, LDL 65 and HDL 50.  NSTEMI (non-ST elevated myocardial infarction) ([place]) History of CAD status post non-STEMI 09/25/2018.  1 chronic catheterization by Dr. Irish Lack revealing occluded dominant RCA with left-to-right collaterals.  He was stented with a 3.5 mm x 20 mm long drug-eluting stent with excellent results.  He had no other residual CAD.  He was placed on aspirin Brilinta which caused shortness of breath.  He was transitioned to Plavix.  Because of recurrent symptoms I recatheterized him 12/11/2018 be the right radial approach revealing widely patent RCA with no other significant disease.  He was complaining of some atypical chest  pain or shortness of breath in September and underwent Myoview stress testing 08/26/2020 which is low risk nonischemic.  2D echo performed at the same time revealed normal LV systolic function.      Lorretta Harp MD FACP,FACC,FAHA, [facility] 09/12/2020 9:53 AM

## 2020-09-12 NOTE — Assessment & Plan Note (Signed)
History of dyslipidemia on statin therapy with lipid profile performed 01/01/2020 revealing total cholesterol 124, LDL 65 and HDL 50.

## 2020-09-12 NOTE — Assessment & Plan Note (Signed)
History of CAD status post non-STEMI 09/25/2018.  1 chronic catheterization by Dr. Irish Lack revealing occluded dominant RCA with left-to-right collaterals.  He was stented with a 3.5 mm x 20 mm long drug-eluting stent with excellent results.  He had no other residual CAD.  He was placed on aspirin Brilinta which caused shortness of breath.  He was transitioned to Plavix.  Because of recurrent symptoms I recatheterized him 12/11/2018 be the right radial approach revealing widely patent RCA with no other significant disease.  He was complaining of some atypical chest pain or shortness of breath in September and underwent Myoview stress testing 08/26/2020 which is low risk nonischemic.  2D echo performed at the same time revealed normal LV systolic function.

## 2020-09-12 NOTE — Patient Instructions (Signed)
Medication Instructions:  Your physician recommends that you continue on your current medications as directed. Please refer to the Current Medication list given to you today.  *If you need a refill on your cardiac medications before your next appointment, please call your pharmacy*  Follow-Up: At [facility], you and your health needs are our priority.  As part of our continuing mission to provide you with exceptional heart care, we have created designated Provider Care Teams.  These Care Teams include your primary Cardiologist (physician) and Advanced Practice Providers (APPs -  Physician Assistants and Nurse Practitioners) who all work together to provide you with the care you need, when you need it.  We recommend signing up for the patient portal called "MyChart".  Sign up information is provided on this After Visit Summary.  MyChart is used to connect with patients for Virtual Visits (Telemedicine).  Patients are able to view lab/test results, encounter notes, upcoming appointments, etc.  Non-urgent messages can be sent to your provider as well.   To learn more about what you can do with MyChart, go to [email or web address].    Your next appointment:   3 month(s) with Sande Rives PA 12 months with Dr. Gwenlyn Found

## 2020-09-17 ENCOUNTER — Ambulatory Visit: Payer: Medicare HMO | Admitting: Cardiovascular Disease

## 2020-09-18 DIAGNOSIS — R972 Elevated prostate specific antigen [PSA]: Secondary | ICD-10-CM | POA: Diagnosis not present

## 2020-12-15 NOTE — Progress Notes (Signed)
Cardiology Office Note:    Date:  12/26/2020   ID:  [name], DOB 11-25-1950, MRN [id number]  PCP:  Edward Peng, NP  Cardiologist:  Quay Burow, MD  Electrophysiologist:  None   Referring MD: Edward Peng, NP   Chief Complaint: follow-up of CAD  History of Present Illness:    Edward Freeman is a 70 y.o. male with a history of CAD with NSTEMI in 09/2018 s/p DES to RCA, paroxysmal SVT, bradycardia, hypertension, hyperlipidemia, GERD, migraines, and lung nodule who is followed by Dr. Gwenlyn Freeman and presents today for routine follow-up.  Patient was admitted in 09/2018 with NSTEMI. Cardiac catheterization showed 100% stenosis of mid RCA with left to right collaterals. Other vessels were clean. RCA lesion was treated with DES. Echo showed LVEF of 55-60% with hypokinesis of basal inferior wall and grade 1 diastolic dysfunction. Patient was placed on DAPT with Brilinta and Aspirin; however, Brilinta later switched to Plavix due to shortness of breath. He had another cardiac catheterization in 12/2018 for recurrent chest pain which showed widely patent RCA stent with no other CAD.   At office visit in 07/2020, patient noted mild chest pain and shortness of breath as well as an episode of dizziness and sweating while working in the yard followed by severe shortness of breath and 7/10 sharp substernal chest pain. Echo was ordered and showed LVEF of 55-60% with basal inferior hypokinesis, mild LVH, and mildly dilated ascending aorta measuring 42 mm. Wall motion abnormalities were not new. I saw patient for follow-up in 08/2020 at which time he was feeling a little better but continued to note some shortness of breath as well as some very atypical sounding chest pain that would occur when taking a deep breath. He also reported some fatigue. Myoview was ordered for further evaluation and showed a fixed defect but no significant ischemia. Heart rate was 49 bpm at office visit. I was concerned about  possible chronotropic incompetence. Unfortunately, Lexiscan Myoview was performed rather than exercise Myoview so this could not be assessed. TSH was normal. He was seen by Dr. Gwenlyn Freeman in 09/2020 and reported his symptoms had resolved.  Patient presents today for follow-up. Here alone. Patient states he is doing well. He continues to have some dyspnea on exertion but states this is not new and is stable. No dyspnea at rest. No orthopnea or PND. He has some swelling and numbness around his left knee but know other lower extremity swelling. He states he has seen Ortho for this in the past and was told he had arthritis. At first he denied any chest pain. However, then he reports lower substernal/upper abdomen discomfort that he described as heartburn. This is non-radiating. Occurs at rest. No exertional chest pain. He states this can last for 10 minutes at time but then states he has this mild discomfort all the time. Occurs about 2-3 times per week. He did note that he is currently having it in the office but he states it is very mild. Recommended getting EKG but he declined. He states he does not think it is his heart. Not similar to prior anginal pain. He states it has improved with over-the-counter reflux medicine in the past. He states this is similar to the pain he had back when I saw him in October when we got a stress test. Not getting any worse. He denies any palpitations. He notes some very mild lightheadedness/dizziness if he lift something heavy that last for a brief amount of time.  No falls or syncope. No abnormal bleeding in urine or stools.  Past Medical History:  Diagnosis Date  . CAD (coronary artery disease)    a. NSTEMI 09/2018 s/p DES to RCA, EF 55-60%.  Marland Kitchen. GERD (gastroesophageal reflux disease)   . Hyperlipidemia   . Hypertension   . Migraines   . Nodule of left lung   . PSVT (paroxysmal supraventricular tachycardia) (HCC)   . Sinus bradycardia   . Syncope and collapse     Past  Surgical History:  Procedure Laterality Date  . CORONARY STENT INTERVENTION N/A 09/25/2018   Procedure: CORONARY STENT INTERVENTION;  Surgeon: Corky CraftsVaranasi, Jayadeep S, MD;  [facility];  Service: Cardiovascular;  Laterality: N/A;  . LEFT HEART CATH AND CORONARY ANGIOGRAPHY N/A 09/25/2018   Procedure: LEFT HEART CATH AND CORONARY ANGIOGRAPHY;  Surgeon: Corky CraftsVaranasi, Jayadeep S, MD;  [facility];  Service: Cardiovascular;  Laterality: N/A;  . LEFT HEART CATH AND CORONARY ANGIOGRAPHY N/A 12/11/2018   Procedure: LEFT HEART CATH AND CORONARY ANGIOGRAPHY;  Surgeon: Runell GessBerry, Jonathan J, MD;  Location: [facility] INVASIVE CV LAB;  Service: Cardiovascular;  Laterality: N/A;    Current Medications: Current Meds  Medication Sig  . aspirin 81 MG tablet Take 1 tablet (81 mg total) by mouth daily. (Patient taking differently: Take 81 mg by mouth daily after lunch.)  . tamsulosin (FLOMAX) 0.4 MG CAPS capsule Take 1 capsule by mouth daily.  . [DISCONTINUED] amLODipine (NORVASC) 5 MG tablet Take 1 tablet (5 mg total) by mouth daily.  . [DISCONTINUED] atorvastatin (LIPITOR) 80 MG tablet Take 1 tablet (80 mg total) by mouth daily.  . [DISCONTINUED] clopidogrel (PLAVIX) 75 MG tablet Take 1 tablet (75 mg total) by mouth daily.  . [DISCONTINUED] nitroGLYCERIN (NITROSTAT) 0.4 MG SL tablet Place 1 tablet (0.4 mg total) under the tongue every 5 (five) minutes as needed for chest pain (up to 3 doses. If taking 3rd dose call 911).     Allergies:   Patient has no known allergies.   Social History   Socioeconomic History  . Marital status: Single    Spouse name: Not on file  . Number of children: Not on file  . Years of education: Not on file  . Highest education level: Not on file  Occupational History  . Not on file  Tobacco Use  . Smoking status: Never Smoker  . Smokeless tobacco: Never Used  Vaping Use  . Vaping Use: Never used  Substance and Sexual Activity  . Alcohol use: No     Comment: 1-2 beers a month  . Drug use: No  . Sexual activity: Not on file  Other Topics Concern  . Not on file  Social History Narrative  . Not on file   Social Determinants of Health   Financial Resource Strain: Not on file  Food Insecurity: Not on file  Transportation Needs: Not on file  Physical Activity: Not on file  Stress: Not on file  Social Connections: Not on file     Family History: The patient's family history includes Alcohol abuse in his father; Diabetes in his brother, sister, and sister; Hypertension in his mother; Stroke in his mother. There is no history of Colon cancer, Esophageal cancer, Rectal cancer, or Stomach cancer.  ROS:   Please see the history of present illness.     EKGs/Labs/Other Studies Reviewed:    The following studies were reviewed today:  Left Heart Catheterization 12/11/2018: Impressions: Mr. Madilyn FiremanHayes has a widely patent  RCA stent with otherwise normal coronary arteries and normal filling pressures. I do not think his symptoms are ischemically mediated but more likely related to Brilinta. The sheath was removed and a TR band was placed on the right wrist to achieve patent hemostasis. The patient left the lab in stable condition. He will be discharged home today as an outpatient and will see Jory Sims, FNP back in the office in 7 to 10 days and me in 4 to 6 weeks.  Diagnostic Dominance: Right  _______________  Echocardiogram 08/04/2020: Impressions: 1. Basal inferior hypokinesis . Left ventricular ejection fraction, by  estimation, is 55 to 60%. The left ventricle has normal function. The left  ventricular internal cavity size was mildly dilated. There is mild left  ventricular hypertrophy. Left  ventricular diastolic parameters are indeterminate.  2. Right ventricular systolic function is normal. The right ventricular  size is normal.  3. The mitral valve is abnormal. Mild mitral valve regurgitation.  4. The aortic valve is  normal in structure. Aortic valve regurgitation is  trivial.  5. Aortic dilatation noted. There is mild dilatation of the ascending  aorta, measuring 42 mm.   Comparison(s): The left ventricular function is unchanged. _______________  Carlton Adam Myoview 08/26/2020:  The left ventricular ejection fraction is mildly decreased (45-54%).  Nuclear stress EF: 50%.  No T wave inversion was noted during stress.  There was no ST segment deviation noted during stress.  Defect 1: There is a medium defect of moderate severity present in the basal inferoseptal, basal inferior and mid inferoseptal location.  This is a low risk study.   Moderate size and intensity, fixed basal inferior, basal to mid inferoseptal perfusion defect, suggestive of distal RCA territory scar or possibly artifact. No significant ischemia. LVEF 50% with basal inferior/inferoseptal hypokinesis. Overall a low risk study. Compared to a prior study in 2019, there is a new fixed perfusion defect with small decline in LVEF.   EKG:  EKG not ordered today.   Recent Labs: 01/01/2020: ALT 29; BUN 14; Creatinine, Ser 1.10; Potassium 4.3; Sodium 145 08/13/2020: Hemoglobin 15.3; Platelets 182; TSH 1.770  Recent Lipid Panel    Component Value Date/Time   CHOL 124 01/01/2020 0841   CHOL 166 08/07/2019 0840   TRIG 40.0 01/01/2020 0841   HDL 50.70 01/01/2020 0841   HDL 57 08/07/2019 0840   CHOLHDL 2 01/01/2020 0841   VLDL 8.0 01/01/2020 0841   LDLCALC 65 01/01/2020 0841   LDLCALC 97 08/07/2019 0840    Physical Exam:    Vital Signs: BP (!) 146/74   Pulse (!) 51   Ht 5\' 10"  (1.778 m)   Wt 207 lb (93.9 kg)   SpO2 97%   BMI 29.70 kg/m     Wt Readings from Last 3 Encounters:  12/26/20 207 lb (93.9 kg)  09/12/20 206 lb (93.4 kg)  08/26/20 206 lb (93.4 kg)     General: 70 y.o. male in no acute distress. HEENT: Normocephalic and atraumatic. Sclera clear.  Neck: Supple. No carotid bruits. No JVD. Heart: Bradycardic with  regular rhythm. Distinct S1 and S2. No murmurs, gallops, or rubs. Radial pulses 2+ and equal bilaterally. Lungs: No increased work of breathing. Clear to ausculation bilaterally. No wheezes, rhonchi, or rales.  Abdomen: Soft, non-distended, and non-tender to palpation.  Extremities: No lower extremity edema.    Skin: Warm and dry. Neuro: Alert and oriented x3. No focal deficits. Psych: Normal affect. Responds appropriately.  Assessment:    1. Atypical chest  pain   2. Chronic dyspnea   3. Coronary artery disease involving native coronary artery of native heart without angina pectoris   4. Ascending aorta dilatation (HCC)   5. Primary hypertension   6. Dyslipidemia, goal LDL below 70   7. Sinus bradycardia     Plan:    Atypical Chest Pain  Chronic Stable Dyspnea History of CAD - History of NSTEMI in 07/2018 s/p DES to RCA. Repeat cath in 12/2018 showed patent RCA stent and no other CAD. Patient reported some atypical chest pain and shortness of breath in September/October 2021. Echo in 07/2020 showed normal LVEF and wall motion. Myoview in 08/2020 showed fixed defect but no significant ischemia.  - He continues to report atypical chest pain and dyspnea on exertion which is stable. He describes chest pain as heartburn and states over-the-counter reflux medicine have helped in the past. Recommend trying Protonix but he does not want to be on any other medications at this time. - Continue aspirin.  - No beta-blocker due to baseline bradycardia. - Continue high-intensity statin and Zetia. - I don't think any additional evaluation is necessary at this time given recent work-up and unchanged symptoms. Patient will let us know if symptoms worsens or change.  Mildly Dilated Ascending Aorta - Echo showed mildly dilated ascending aorta measuring 42 mm. - Recommended chest CTA for further evaluation but patient declined.  Hypertension - BP mildly elevated at 146/74. - Discussed increasing  Amlodipine again but patient would like to hold off. Continue Amlodipine 5mg  daily for now. - Asked patient to keep BP/HR log for 2-3 weeks and then send [place] to this.  Dyslipidemia - Lipid panel from 12/2019: Total Cholesterol 124, Triglycerides 40, HDL 50.70, LDL 65.  - LDL goal <70 given CAD.  - Continue Lipitor 80mg  daily and Zetia 10mg  daily. - Labs followed by PCP.  Sinus Bradycardia  - Chronic and stable. Asymptomatic with this.   Disposition: Follow up in 6-9 months with Dr. Gwenlyn Freeman.   Medication Adjustments/Labs and Tests Ordered: Current medicines are reviewed at length with the patient today.  Concerns regarding medicines are outlined above.  No orders of the defined types were placed in this encounter.  Meds ordered this encounter  Medications  . nitroGLYCERIN (NITROSTAT) 0.4 MG SL tablet    Sig: Place 1 tablet (0.4 mg total) under the tongue every 5 (five) minutes as needed for chest pain (up to 3 doses. If taking 3rd dose call 911).    Dispense:  25 tablet    Refill:  3  . ezetimibe (ZETIA) 10 MG tablet    Sig: Take 1 tablet (10 mg total) by mouth daily.    Dispense:  90 tablet    Refill:  3  . atorvastatin (LIPITOR) 80 MG tablet    Sig: Take 1 tablet (80 mg total) by mouth daily.    Dispense:  90 tablet    Refill:  1  . amLODipine (NORVASC) 5 MG tablet    Sig: Take 1 tablet (5 mg total) by mouth daily.    Dispense:  90 tablet    Refill:  30    Patient Instructions  Medication Instructions:  No Changes *If you need a refill on your cardiac medications before your next appointment, please call your pharmacy*   Lab Work: No labs If you have labs (blood work) drawn today and your tests are completely normal, you will receive your results only by: Marland Kitchen MyChart Message (if you have MyChart) OR .  A paper copy in the mail If you have any lab test that is abnormal or we need to change your treatment, we will call you to review the  results.   Testing/Procedures: No Testing   Follow-Up: At [facility], you and your health needs are our priority.  As part of our continuing mission to provide you with exceptional heart care, we have created designated Provider Care Teams.  These Care Teams include your primary Cardiologist (physician) and Advanced Practice Providers (APPs -  Physician Assistants and Nurse Practitioners) who all work together to provide you with the care you need, when you need it.   Your next appointment:   6-9 month(s)  The format for your next appointment:   In Person  Provider:   Quay Burow, MD   Other Instructions Monitor Blood Pressure Daily.    Signed, Darreld Mclean, PA-C  12/26/2020 10:18 AM    [facility]

## 2020-12-26 ENCOUNTER — Other Ambulatory Visit: Payer: Self-pay

## 2020-12-26 ENCOUNTER — Encounter: Payer: Self-pay | Admitting: Student

## 2020-12-26 ENCOUNTER — Ambulatory Visit: Payer: Medicare HMO | Admitting: Student

## 2020-12-26 VITALS — BP 146/74 | HR 51 | Ht 70.0 in | Wt 207.0 lb

## 2020-12-26 DIAGNOSIS — R001 Bradycardia, unspecified: Secondary | ICD-10-CM | POA: Diagnosis not present

## 2020-12-26 DIAGNOSIS — I10 Essential (primary) hypertension: Secondary | ICD-10-CM

## 2020-12-26 DIAGNOSIS — I2510 Atherosclerotic heart disease of native coronary artery without angina pectoris: Secondary | ICD-10-CM

## 2020-12-26 DIAGNOSIS — I77810 Thoracic aortic ectasia: Secondary | ICD-10-CM

## 2020-12-26 DIAGNOSIS — R0609 Other forms of dyspnea: Secondary | ICD-10-CM

## 2020-12-26 DIAGNOSIS — R0789 Other chest pain: Secondary | ICD-10-CM

## 2020-12-26 DIAGNOSIS — E785 Hyperlipidemia, unspecified: Secondary | ICD-10-CM | POA: Diagnosis not present

## 2020-12-26 MED ORDER — EZETIMIBE 10 MG PO TABS: 10.0000 mg | ORAL_TABLET | Freq: Every day | ORAL | 3 refills | Status: DC

## 2020-12-26 MED ORDER — NITROGLYCERIN 0.4 MG SL SUBL: 0.4000 mg | SUBLINGUAL_TABLET | SUBLINGUAL | 3 refills | Status: AC | PRN

## 2020-12-26 MED ORDER — AMLODIPINE BESYLATE 5 MG PO TABS
5.0000 mg | ORAL_TABLET | Freq: Every day | ORAL | 30 refills | Status: DC
Start: 2020-12-26 — End: 2021-09-07

## 2020-12-26 MED ORDER — ATORVASTATIN CALCIUM 80 MG PO TABS
80.0000 mg | ORAL_TABLET | Freq: Every day | ORAL | 1 refills | Status: DC
Start: 2020-12-26 — End: 2021-07-22

## 2020-12-26 NOTE — Patient Instructions (Signed)
Medication Instructions:  No Changes *If you need a refill on your cardiac medications before your next appointment, please call your pharmacy*   Lab Work: No labs If you have labs (blood work) drawn today and your tests are completely normal, you will receive your results only by: Marland Kitchen MyChart Message (if you have MyChart) OR . A paper copy in the mail If you have any lab test that is abnormal or we need to change your treatment, we will call you to review the results.   Testing/Procedures: No Testing   Follow-Up: At [facility], you and your health needs are our priority.  As part of our continuing mission to provide you with exceptional heart care, we have created designated Provider Care Teams.  These Care Teams include your primary Cardiologist (physician) and Advanced Practice Providers (APPs -  Physician Assistants and Nurse Practitioners) who all work together to provide you with the care you need, when you need it.   Your next appointment:   6-9 month(s)  The format for your next appointment:   In Person  Provider:   Quay Burow, MD   Other Instructions Monitor Blood Pressure Daily.

## 2020-12-31 ENCOUNTER — Other Ambulatory Visit: Payer: Self-pay

## 2021-01-01 ENCOUNTER — Encounter: Payer: Self-pay | Admitting: Adult Health

## 2021-01-01 ENCOUNTER — Ambulatory Visit (INDEPENDENT_AMBULATORY_CARE_PROVIDER_SITE_OTHER): Payer: Medicare HMO | Admitting: Adult Health

## 2021-01-01 VITALS — BP 130/80 | Temp 98.0°F | Ht 70.0 in | Wt 207.0 lb

## 2021-01-01 DIAGNOSIS — I2510 Atherosclerotic heart disease of native coronary artery without angina pectoris: Secondary | ICD-10-CM | POA: Diagnosis not present

## 2021-01-01 DIAGNOSIS — E785 Hyperlipidemia, unspecified: Secondary | ICD-10-CM

## 2021-01-01 DIAGNOSIS — Z0000 Encounter for general adult medical examination without abnormal findings: Secondary | ICD-10-CM | POA: Diagnosis not present

## 2021-01-01 DIAGNOSIS — I10 Essential (primary) hypertension: Secondary | ICD-10-CM | POA: Diagnosis not present

## 2021-01-01 DIAGNOSIS — Z9861 Coronary angioplasty status: Secondary | ICD-10-CM

## 2021-01-01 DIAGNOSIS — N400 Enlarged prostate without lower urinary tract symptoms: Secondary | ICD-10-CM

## 2021-01-01 DIAGNOSIS — Z23 Encounter for immunization: Secondary | ICD-10-CM

## 2021-01-01 DIAGNOSIS — Z1159 Encounter for screening for other viral diseases: Secondary | ICD-10-CM

## 2021-01-01 LAB — CBC WITH DIFFERENTIAL/PLATELET
Basophils Absolute: 0 10*3/uL (ref 0.0–0.1)
Basophils Relative: 0.3 % (ref 0.0–3.0)
Eosinophils Absolute: 0.3 10*3/uL (ref 0.0–0.7)
Eosinophils Relative: 6.1 % — ABNORMAL HIGH (ref 0.0–5.0)
HCT: 45 % (ref 39.0–52.0)
Hemoglobin: 15.4 g/dL (ref 13.0–17.0)
Lymphocytes Relative: 25.9 % (ref 12.0–46.0)
Lymphs Abs: 1.5 10*3/uL (ref 0.7–4.0)
MCHC: 34.1 g/dL (ref 30.0–36.0)
MCV: 86.7 fl (ref 78.0–100.0)
Monocytes Absolute: 0.5 10*3/uL (ref 0.1–1.0)
Monocytes Relative: 9 % (ref 3.0–12.0)
Neutro Abs: 3.4 10*3/uL (ref 1.4–7.7)
Neutrophils Relative %: 58.7 % (ref 43.0–77.0)
Platelets: 152 10*3/uL (ref 150.0–400.0)
RBC: 5.2 Mil/uL (ref 4.22–5.81)
RDW: 14.2 % (ref 11.5–15.5)
WBC: 5.8 10*3/uL (ref 4.0–10.5)

## 2021-01-01 LAB — COMPREHENSIVE METABOLIC PANEL
ALT: 24 U/L (ref 0–53)
AST: 22 U/L (ref 0–37)
Albumin: 3.9 g/dL (ref 3.5–5.2)
Alkaline Phosphatase: 72 U/L (ref 39–117)
BUN: 16 mg/dL (ref 6–23)
CO2: 27 mEq/L (ref 19–32)
Calcium: 9 mg/dL (ref 8.4–10.5)
Chloride: 108 mEq/L (ref 96–112)
Creatinine, Ser: 1.03 mg/dL (ref 0.40–1.50)
GFR: 73.98 mL/min (ref >60.00)
Glucose, Bld: 88 mg/dL (ref 70–99)
Potassium: 3.9 mEq/L (ref 3.5–5.1)
Sodium: 144 mEq/L (ref 135–145)
Total Bilirubin: 1.5 mg/dL — ABNORMAL HIGH (ref 0.2–1.2)
Total Protein: 6.6 g/dL (ref 6.0–8.3)

## 2021-01-01 LAB — LIPID PANEL
Cholesterol: 116 mg/dL (ref 0–200)
HDL: 55.6 mg/dL (ref >39.00)
LDL Cholesterol: 52 mg/dL (ref 0–99)
NonHDL: 59.93
Total CHOL/HDL Ratio: 2
Triglycerides: 39 mg/dL (ref 0.0–149.0)
VLDL: 7.8 mg/dL (ref 0.0–40.0)

## 2021-01-01 LAB — PSA: PSA: 7.89 ng/mL — ABNORMAL HIGH (ref 0.10–4.00)

## 2021-01-01 LAB — TSH: TSH: 2.08 u[IU]/mL (ref 0.35–4.50)

## 2021-01-01 NOTE — Addendum Note (Signed)
Addended by: Miles Costain T on: 01/01/2021 09:10 AM   Modules accepted: Orders

## 2021-01-01 NOTE — Progress Notes (Signed)
Subjective:    Patient ID: Edward Freeman, male    DOB: [birth year], 70 y.o.   MRN: [id number]  HPI Patient presents for yearly preventative medicine examination. He is a pleasant 70 year old male who  has a past medical history of CAD (coronary artery disease), GERD (gastroesophageal reflux disease), Hyperlipidemia, Hypertension, Migraines, Nodule of left lung, PSVT (paroxysmal supraventricular tachycardia) ([place]), Sinus bradycardia, and Syncope and collapse.  Essential hypertension-takes Norvasc 5 mg daily.  He denies dizziness, lightheadedness, or chest pain.  BP Readings from Last 3 Encounters:  01/01/21 130/80  12/26/20 (!) 146/74  09/12/20 140/72    Hyperlipidemia -currently managed by cardiology with Lipitor 80 mg, Zetia 10 mg, and aspirin 81 mg daily.  He denies myalgia or fatigue Lab Results  Component Value Date   CHOL 124 01/01/2020   HDL 50.70 01/01/2020   LDLCALC 65 01/01/2020   TRIG 40.0 01/01/2020   CHOLHDL 2 01/01/2020    CAD- s/p percutaneous coronary angioplasty.  He has a drug-eluting stent for an occluded dominant RCA, this was done on 09/25/2018.  He did have some recurrent symptoms similar to preinterventions and a repeat catheterization was performed on 12/11/2018 which revealed a widely patent RCA stent with no other disease.  He is on Plavix and aspirin.  Oertli denies chest pain but does have intermittent episodes of shortness of breath eith exertion which has been ongoing for years  BPH-currently asymptomatic with Flomax 0.4 mg. He sees Urology yearly.   All immunizations and health maintenance protocols were reviewed with the patient and needed orders were placed. Will get flu shot today but refuses second pneumonia vaccination.   Appropriate screening laboratory values were ordered for the patient including screening of hyperlipidemia, renal function and hepatic function. If indicated by BPH, a PSA was ordered.  Medication reconciliation,  past medical  history, social history, problem list and allergies were reviewed in detail with the patient  Goals were established with regard to weight loss, exercise, and  diet in compliance with medications. He tries to stay active in the yard and exercises but infrequently. He tries to eat healthy   Wt Readings from Last 3 Encounters:  01/01/21 207 lb (93.9 kg)  12/26/20 207 lb (93.9 kg)  09/12/20 206 lb (93.4 kg)    He is up-to-date on routine colon cancer screening, next in August 2023.   Review of Systems  Constitutional: Negative.   HENT: Negative.   Eyes: Negative.   Respiratory: Positive for shortness of breath.   Cardiovascular: Negative.   Gastrointestinal: Negative.   Endocrine: Negative.   Genitourinary: Negative.   Musculoskeletal: Positive for arthralgias.  Skin: Negative.   Allergic/Immunologic: Negative.   Neurological: Negative.   Hematological: Negative.   Psychiatric/Behavioral: Negative.   All other systems reviewed and are negative.  Past Medical History:  Diagnosis Date  . CAD (coronary artery disease)    a. NSTEMI 09/2018 s/p DES to RCA, EF 55-60%.  Marland Kitchen GERD (gastroesophageal reflux disease)   . Hyperlipidemia   . Hypertension   . Migraines   . Nodule of left lung   . PSVT (paroxysmal supraventricular tachycardia) ([place])   . Sinus bradycardia   . Syncope and collapse     Social History   Socioeconomic History  . Marital status: Single    Spouse name: Not on file  . Number of children: Not on file  . Years of education: Not on file  . Highest education level: Not on file  Occupational History  .  Not on file  Tobacco Use  . Smoking status: Never Smoker  . Smokeless tobacco: Never Used  Vaping Use  . Vaping Use: Never used  Substance and Sexual Activity  . Alcohol use: No    Comment: 1-2 beers a month  . Drug use: No  . Sexual activity: Not on file  Other Topics Concern  . Not on file  Social History Narrative  . Not on file   Social  Determinants of Health   Financial Resource Strain: Not on file  Food Insecurity: Not on file  Transportation Needs: Not on file  Physical Activity: Not on file  Stress: Not on file  Social Connections: Not on file  Intimate Partner Violence: Not on file    Past Surgical History:  Procedure Laterality Date  . CORONARY STENT INTERVENTION N/A 09/25/2018   Procedure: CORONARY STENT INTERVENTION;  Surgeon: Jettie Booze, MD;  Location: [place];  Service: Cardiovascular;  Laterality: N/A;  . LEFT HEART CATH AND CORONARY ANGIOGRAPHY N/A 09/25/2018   Procedure: LEFT HEART CATH AND CORONARY ANGIOGRAPHY;  Surgeon: Jettie Booze, MD;  Location: [facility];  Service: Cardiovascular;  Laterality: N/A;  . LEFT HEART CATH AND CORONARY ANGIOGRAPHY N/A 12/11/2018   Procedure: LEFT HEART CATH AND CORONARY ANGIOGRAPHY;  Surgeon: Lorretta Harp, MD;  Location: [facility];  Service: Cardiovascular;  Laterality: N/A;    Family History  Problem Relation Age of Onset  . Hypertension Mother   . Stroke Mother   . Alcohol abuse Father   . Diabetes Sister   . Diabetes Brother   . Diabetes Sister   . Colon cancer Neg Hx   . Esophageal cancer Neg Hx   . Rectal cancer Neg Hx   . Stomach cancer Neg Hx     No Known Allergies  Current Outpatient Medications on File Prior to Visit  Medication Sig Dispense Refill  . amLODipine (NORVASC) 5 MG tablet Take 1 tablet (5 mg total) by mouth daily. 90 tablet 30  . aspirin 81 MG tablet Take 1 tablet (81 mg total) by mouth daily. (Patient taking differently: Take 81 mg by mouth daily after lunch.) 30 tablet 11  . atorvastatin (LIPITOR) 80 MG tablet Take 1 tablet (80 mg total) by mouth daily. 90 tablet 1  . ezetimibe (ZETIA) 10 MG tablet Take 1 tablet (10 mg total) by mouth daily. 90 tablet 3  . nitroGLYCERIN (NITROSTAT) 0.4 MG SL tablet Place 1 tablet (0.4 mg total) under the tongue every 5 (five) minutes as needed for chest  pain (up to 3 doses. If taking 3rd dose call 911). 25 tablet 3  . tamsulosin (FLOMAX) 0.4 MG CAPS capsule Take 1 capsule by mouth daily.     No current facility-administered medications on file prior to visit.    There were no vitals taken for this visit.      Objective:   Physical Exam Vitals and nursing note reviewed.  Constitutional:      General: He is not in acute distress.    Appearance: Normal appearance. He is well-developed and normal weight.  HENT:     Head: Normocephalic and atraumatic.     Right Ear: Tympanic membrane, ear canal and external ear normal. There is no impacted cerumen.     Left Ear: Tympanic membrane, ear canal and external ear normal. There is no impacted cerumen.     Nose: Nose normal. No congestion or rhinorrhea.     Mouth/Throat:  Mouth: Mucous membranes are moist.     Pharynx: Oropharynx is clear. No oropharyngeal exudate or posterior oropharyngeal erythema.  Eyes:     General:        Right eye: No discharge.        Left eye: No discharge.     Extraocular Movements: Extraocular movements intact.     Conjunctiva/sclera: Conjunctivae normal.     Pupils: Pupils are equal, round, and reactive to light.  Neck:     Vascular: No carotid bruit.     Trachea: No tracheal deviation.  Cardiovascular:     Rate and Rhythm: Normal rate and regular rhythm.     Pulses: Normal pulses.     Heart sounds: Normal heart sounds. No murmur heard. No friction rub. No gallop.   Pulmonary:     Effort: Pulmonary effort is normal. No respiratory distress.     Breath sounds: Normal breath sounds. No stridor. No wheezing, rhonchi or rales.  Chest:     Chest wall: No tenderness.  Abdominal:     General: Bowel sounds are normal. There is no distension.     Palpations: Abdomen is soft. There is no mass.     Tenderness: There is no abdominal tenderness. There is no right CVA tenderness, left CVA tenderness, guarding or rebound.     Hernia: No hernia is present.   Musculoskeletal:        General: No swelling, tenderness, deformity or signs of injury. Normal range of motion.     Right lower leg: No edema.     Left lower leg: No edema.  Lymphadenopathy:     Cervical: No cervical adenopathy.  Skin:    General: Skin is warm and dry.     Capillary Refill: Capillary refill takes less than 2 seconds.     Coloration: Skin is not jaundiced or pale.     Findings: No bruising, erythema, lesion or rash.  Neurological:     General: No focal deficit present.     Mental Status: He is alert and oriented to person, place, and time.     Cranial Nerves: No cranial nerve deficit.     Sensory: No sensory deficit.     Motor: No weakness.     Coordination: Coordination normal.     Gait: Gait normal.     Deep Tendon Reflexes: Reflexes normal.  Psychiatric:        Mood and Affect: Mood normal.        Behavior: Behavior normal.        Thought Content: Thought content normal.        Judgment: Judgment normal.       Assessment & Plan:  1. Routine general medical examination at a health care facility - Encouraged to be more active with aerobic exercise. Continue to eat healthy - Follow up in one year or sooner if needed - CBC with Differential/Platelet; Future - Comprehensive metabolic panel; Future - Lipid panel; Future - TSH; Future  2. Essential hypertension - Controlled. No changes in medication - CBC with Differential/Platelet; Future - Comprehensive metabolic panel; Future - Lipid panel; Future - TSH; Future  3. CAD S/P percutaneous coronary angioplasty - Follow up with Cardiology as directed - CBC with Differential/Platelet; Future - Comprehensive metabolic panel; Future - Lipid panel; Future - TSH; Future  4. Benign prostatic hyperplasia without lower urinary tract symptoms  - PSA; Future  5. Dyslipidemia, goal LDL below 70  - CBC with Differential/Platelet; Future - Comprehensive metabolic panel;  Future - Lipid panel; Future - TSH;  Future  6. Need for hepatitis C screening test  - Hep C Antibody; Future  Dorothyann Peng, NP

## 2021-01-01 NOTE — Patient Instructions (Signed)
It was great seeing you today   We will follow up with you regarding your blood work   Please work on more routine aerobic exercise   Follow up in one year or sooner if needed

## 2021-01-01 NOTE — Addendum Note (Signed)
Addended by: Tessie Fass D on: 01/01/2021 08:31 AM   Modules accepted: Orders

## 2021-01-02 LAB — HEPATITIS C ANTIBODY
Hepatitis C Ab: NONREACTIVE
SIGNAL TO CUT-OFF: 0 (ref <1.00)

## 2021-03-23 ENCOUNTER — Telehealth: Payer: Self-pay | Admitting: Adult Health

## 2021-03-23 NOTE — Telephone Encounter (Signed)
Left message for patient to call back and schedule Medicare Annual Wellness Visit (AWV) either virtually or in office.   awv-i per [place] 03/08/17  please schedule at anytime with [facility] Nurse Health Advisor 1 or 2   This should be a 45 minute visit.

## 2021-03-24 DIAGNOSIS — R972 Elevated prostate specific antigen [PSA]: Secondary | ICD-10-CM | POA: Diagnosis not present

## 2021-03-24 LAB — PSA: PSA: 9.4

## 2021-03-30 DIAGNOSIS — N401 Enlarged prostate with lower urinary tract symptoms: Secondary | ICD-10-CM | POA: Diagnosis not present

## 2021-03-30 DIAGNOSIS — R351 Nocturia: Secondary | ICD-10-CM | POA: Diagnosis not present

## 2021-03-30 DIAGNOSIS — R972 Elevated prostate specific antigen [PSA]: Secondary | ICD-10-CM | POA: Diagnosis not present

## 2021-04-01 ENCOUNTER — Other Ambulatory Visit: Payer: Self-pay

## 2021-04-01 ENCOUNTER — Ambulatory Visit (INDEPENDENT_AMBULATORY_CARE_PROVIDER_SITE_OTHER): Payer: Medicare HMO

## 2021-04-01 DIAGNOSIS — Z0000 Encounter for general adult medical examination without abnormal findings: Secondary | ICD-10-CM | POA: Diagnosis not present

## 2021-04-01 NOTE — Progress Notes (Addendum)
Virtual Visit via Telephone Note  I connected with  Edward Freeman on 04/01/21 at  8:00 AM EDT by telephone and verified that I am speaking with the correct person using two identifiers.  Medicare Annual Wellness visit completed telephonically due to Covid-19 pandemic.   Persons participating in this call: This Health Coach and this patient.   Location: Patient: Home Provider: Office   I discussed the limitations, risks, security and privacy concerns of performing an evaluation and management service by telephone and the availability of in person appointments. The patient expressed understanding and agreed to proceed.  Unable to perform video visit due to video visit attempted and failed and/or patient does not have video capability.   Some vital signs may be absent or patient reported.   Willette Brace, LPN    Subjective:   Edward Freeman is a 70 y.o. male who presents for an Initial Medicare Annual Wellness Visit.  Review of Systems     Cardiac Risk Factors include: advanced age (>56men, >33 women);male gender;dyslipidemia;hypertension     Objective:    There were no vitals filed for this visit. There is no height or weight on file to calculate BMI.  Advanced Directives 04/01/2021 01/09/2019 12/11/2018 09/24/2018 09/24/2018  Does Patient Have a Medical Advance Directive? No No No No No  Would patient like information on creating a medical advance directive? Yes (MAU/Ambulatory/Procedural Areas - Information given) No - Patient declined No - Patient declined No - Patient declined No - Patient declined    Current Medications (verified) Outpatient Encounter Medications as of 04/01/2021  Medication Sig  . amLODipine (NORVASC) 5 MG tablet Take 1 tablet (5 mg total) by mouth daily.  Marland Kitchen aspirin 81 MG tablet Take 1 tablet (81 mg total) by mouth daily. (Patient taking differently: Take 81 mg by mouth daily after lunch.)  . atorvastatin (LIPITOR) 80 MG tablet Take 1 tablet (80 mg  total) by mouth daily.  . nitroGLYCERIN (NITROSTAT) 0.4 MG SL tablet Place 1 tablet (0.4 mg total) under the tongue every 5 (five) minutes as needed for chest pain (up to 3 doses. If taking 3rd dose call 911).  . tamsulosin (FLOMAX) 0.4 MG CAPS capsule Take 1 capsule by mouth daily.  Marland Kitchen ezetimibe (ZETIA) 10 MG tablet Take 1 tablet (10 mg total) by mouth daily.   No facility-administered encounter medications on file as of 04/01/2021.    Allergies (verified) Patient has no known allergies.   History: Past Medical History:  Diagnosis Date  . CAD (coronary artery disease)    a. NSTEMI 09/2018 s/p DES to RCA, EF 55-60%.  Marland Kitchen GERD (gastroesophageal reflux disease)   . Hyperlipidemia   . Hypertension   . Migraines   . Nodule of left lung   . PSVT (paroxysmal supraventricular tachycardia) ([place])   . Sinus bradycardia   . Syncope and collapse    Past Surgical History:  Procedure Laterality Date  . CORONARY STENT INTERVENTION N/A 09/25/2018   Procedure: CORONARY STENT INTERVENTION;  Surgeon: Jettie Booze, MD;  Location: [facility];  Service: Cardiovascular;  Laterality: N/A;  . LEFT HEART CATH AND CORONARY ANGIOGRAPHY N/A 09/25/2018   Procedure: LEFT HEART CATH AND CORONARY ANGIOGRAPHY;  Surgeon: Jettie Booze, MD;  Location: [facility];  Service: Cardiovascular;  Laterality: N/A;  . LEFT HEART CATH AND CORONARY ANGIOGRAPHY N/A 12/11/2018   Procedure: LEFT HEART CATH AND CORONARY ANGIOGRAPHY;  Surgeon: Lorretta Harp, MD;  Location: [facility];  Service: Cardiovascular;  Laterality: N/A;   Family History  Problem Relation Age of Onset  . Hypertension Mother   . Stroke Mother   . Alcohol abuse Father   . Diabetes Sister   . Diabetes Brother   . Diabetes Sister   . Colon cancer Neg Hx   . Esophageal cancer Neg Hx   . Rectal cancer Neg Hx   . Stomach cancer Neg Hx    Social History   Socioeconomic History  . Marital status: Married    Spouse  name: Not on file  . Number of children: Not on file  . Years of education: Not on file  . Highest education level: Not on file  Occupational History  . Not on file  Tobacco Use  . Smoking status: Never Smoker  . Smokeless tobacco: Never Used  Vaping Use  . Vaping Use: Never used  Substance and Sexual Activity  . Alcohol use: No    Comment: 1-2 beers a month  . Drug use: No  . Sexual activity: Not on file  Other Topics Concern  . Not on file  Social History Narrative  . Not on file   Social Determinants of Health   Financial Resource Strain: Low Risk   . Difficulty of Paying Living Expenses: Not hard at all  Food Insecurity: No Food Insecurity  . Worried About Charity fundraiser in the Last Year: Never true  . Ran Out of Food in the Last Year: Never true  Transportation Needs: No Transportation Needs  . Lack of Transportation (Medical): No  . Lack of Transportation (Non-Medical): No  Physical Activity: Insufficiently Active  . Days of Exercise per Week: 2 days  . Minutes of Exercise per Session: 20 min  Stress: No Stress Concern Present  . Feeling of Stress : Not at all  Social Connections: Moderately Isolated  . Frequency of Communication with Friends and Family: Three times a week  . Frequency of Social Gatherings with Friends and Family: Three times a week  . Attends Religious Services: Never  . Active Member of Clubs or Organizations: No  . Attends Archivist Meetings: Never  . Marital Status: Married    Tobacco Counseling Counseling given: Not Answered   Clinical Intake:  Pre-visit preparation completed: Yes  Pain : No/denies pain     BMI - recorded: 29.7 Nutritional Status: BMI 25 -29 Overweight Nutritional Risks: None Diabetes: No  How often do you need to have someone help you when you read instructions, pamphlets, or other written materials from your doctor or pharmacy?: 1 - Never  Diabetic?No  Interpreter Needed?:  No  Information entered by :: Charlott Rakes, LPN   Activities of Daily Living In your present state of health, do you have any difficulty performing the following activities: 04/01/2021  Hearing? N  Vision? N  Difficulty concentrating or making decisions? N  Walking or climbing stairs? N  Dressing or bathing? N  Doing errands, shopping? N  Preparing Food and eating ? N  Using the Toilet? N  In the past six months, have you accidently leaked urine? N  Do you have problems with loss of bowel control? N  Managing your Medications? N  Managing your Finances? N  Housekeeping or managing your Housekeeping? N  Some recent data might be hidden    Patient Care Team: Dorothyann Peng, NP as PCP - General (Family Medicine) Lorretta Harp, MD as PCP - Cardiology (Cardiology) Gerda Diss, DO  as Consulting Physician ([place]) Ceasar Mons, MD as Consulting Physician (Urology)  Indicate any recent [place] you may have received from other than [facility] providers in the past year (date may be approximate).     Assessment:   This is a routine wellness examination for Edward Freeman.  Hearing/Vision screen  Hearing Screening   125Hz  250Hz  500Hz  1000Hz  2000Hz  3000Hz  4000Hz  6000Hz  8000Hz   Right ear:           Left ear:           Comments: Pt denies any hearing issues   Vision Screening Comments: Encouraged to follow up with eye exams  Dietary issues and exercise activities discussed: Current Exercise Habits: Home exercise routine, Type of exercise: walking, Time (Minutes): 20, Frequency (Times/Week): 2, Weekly Exercise (Minutes/Week): 40  Goals Addressed            This Visit's Progress   . Patient Stated       More exercise       Depression Screen PHQ 2/9 Scores 04/01/2021 01/01/2021 01/01/2020  PHQ - 2 Score 0 0 0  PHQ- 9 Score - - 0    Fall Risk Fall Risk  04/01/2021 01/01/2021 01/01/2020  Falls in the past year? 0 0 0  Number falls in past yr: 0 - 0   Injury with Fall? 0 - 0  Risk for fall due to : Impaired vision - -  Follow up Falls prevention discussed - -    FALL RISK PREVENTION PERTAINING TO THE HOME:  Any stairs in or around the home? Yes  If so, are there any without handrails? No  Home free of loose throw rugs in walkways, pet beds, electrical cords, etc? Yes  Adequate lighting in your home to reduce risk of falls? Yes   ASSISTIVE DEVICES UTILIZED TO PREVENT FALLS:  Life alert? No  Use of a cane, walker or w/c? No  Grab bars in the bathroom? No  Shower chair or bench in shower? No  Elevated toilet seat or a handicapped toilet? No   TIMED UP AND GO:  Was the test performed? No .     Cognitive Function:     6CIT Screen 04/01/2021  What Year? 0 points  What month? 0 points  What time? 0 points  Count back from 20 0 points  Months in reverse 0 points  Repeat phrase 10 points  Total Score 10    Immunizations Immunization History  Administered Date(s) Administered  . Fluad Quad(high Dose 65+) 01/01/2021  . PFIZER(Purple Top)SARS-COV-2 Vaccination 12/15/2019, 01/05/2020, 10/28/2020  . Pneumococcal Conjugate-13 05/30/2017      Flu Vaccine status: Up to date  Pneumococcal vaccine status: Due, Education has been provided regarding the importance of this vaccine. Advised may receive this vaccine at local pharmacy or Health Dept. Aware to provide a copy of the vaccination record if obtained from local pharmacy or Health Dept. Verbalized acceptance and understanding.  Covid-19 vaccine status: Completed vaccines  Qualifies for Shingles Vaccine? Yes   Zostavax completed No   Shingrix Completed?: No.    Education has been provided regarding the importance of this vaccine. Patient has been advised to call insurance company to determine out of pocket expense if they have not yet received this vaccine. Advised may also receive vaccine at local pharmacy or Health Dept. Verbalized acceptance and  understanding.  Screening Tests Health Maintenance  Topic Date Due  . PNA vac Low Risk Adult (2 of 2 - PPSV23) 01/01/2022 (Originally  05/30/2018)  . INFLUENZA VACCINE  06/08/2021  . COLONOSCOPY (Pts 45-20yrs Insurance coverage will need to be confirmed)  06/14/2022  . COVID-19 Vaccine  Completed  . Hepatitis C Screening  Completed  . HPV VACCINES  Aged Out    Health Maintenance  There are no preventive care reminders to display for this patient.  Colorectal cancer screening: Type of screening: Colonoscopy. Completed 06/14/17. Repeat every 5 years   Additional Screening:  Hepatitis C Screening: Completed 01/01/21  Vision Screening: Recommended annual ophthalmology exams for early detection of glaucoma and other disorders of the eye. Is the patient up to date with their annual eye exam?  No  Who is the provider or what is the name of the office in which the patient attends annual eye exams? Encouraged to follow up If pt is not established with a provider, would they like to be referred to a provider to establish care? No .   Dental Screening: Recommended annual dental exams for proper oral hygiene  Community Resource Referral / Chronic Care Management: CRR required this visit?  No   CCM required this visit?  No      Plan:     I have personally reviewed and noted the following in the patient's chart:   . Medical and social history . Use of alcohol, tobacco or illicit drugs  . Current medications and supplements including opioid prescriptions. Patient is not currently taking opioid prescriptions. . Functional ability and status . Nutritional status . Physical activity . Advanced directives . List of other physicians . Hospitalizations, surgeries, and ER visits in previous 12 months . Vitals . Screenings to include cognitive, depression, and falls . Referrals and appointments  In addition, I have reviewed and discussed with patient certain preventive protocols, quality  metrics, and best practice recommendations. A written personalized care plan for preventive services as well as general preventive health recommendations were provided to patient.     Willette Brace, LPN   [id number]   Nurse Notes: None

## 2021-04-01 NOTE — Patient Instructions (Addendum)
Edward Freeman , Thank you for taking time to come for your Medicare Wellness Visit. I appreciate your ongoing commitment to your health goals. Please review the following plan we discussed and let me know if I can assist you in the future.   Screening recommendations/referrals: Colonoscopy: Done 06/14/17 Recommended yearly ophthalmology/optometry visit for glaucoma screening and checkup Recommended yearly dental visit for hygiene and checkup  Vaccinations: Influenza vaccine: Up to date Pneumococcal vaccine: Due and discussed Tdap vaccine:  Not a candidate Shingles vaccine: Shingrix discussed. Please contact your pharmacy for coverage information.    Covid-19: Completed 2/6, 2/27, & 10/28/20  Advanced directives: Advance directive discussed with you today. I have provided a copy for you to complete at home and have notarized. Once this is complete please bring a copy in to our office so we can scan it into your chart.  Conditions/risks identified: Do more exercise   Next appointment: Follow up in one year for your annual wellness visit.   Preventive Care 7 Years and Older, Male Preventive care refers to lifestyle choices and visits with your health care provider that can promote health and wellness. What does preventive care include?  A yearly physical exam. This is also called an annual well check.  Dental exams once or twice a year.  Routine eye exams. Ask your health care provider how often you should have your eyes checked.  Personal lifestyle choices, including:  Daily care of your teeth and gums.  Regular physical activity.  Eating a healthy diet.  Avoiding tobacco and drug use.  Limiting alcohol use.  Practicing safe sex.  Taking low doses of aspirin every day.  Taking vitamin and mineral supplements as recommended by your health care provider. What happens during an annual well check? The services and screenings done by your health care provider during your annual  well check will depend on your age, overall health, lifestyle risk factors, and family history of disease. Counseling  Your health care provider may ask you questions about your:  Alcohol use.  Tobacco use.  Drug use.  Emotional well-being.  Home and relationship well-being.  Sexual activity.  Eating habits.  History of falls.  Memory and ability to understand (cognition).  Work and work Statistician. Screening  You may have the following tests or measurements:  Height, weight, and BMI.  Blood pressure.  Lipid and cholesterol levels. These may be checked every 5 years, or more frequently if you are over 2 years old.  Skin check.  Lung cancer screening. You may have this screening every year starting at age 28 if you have a 30-pack-year history of smoking and currently smoke or have quit within the past 15 years.  Fecal occult blood test (FOBT) of the stool. You may have this test every year starting at age 25.  Flexible sigmoidoscopy or colonoscopy. You may have a sigmoidoscopy every 5 years or a colonoscopy every 10 years starting at age 52.  Prostate cancer screening. Recommendations will vary depending on your family history and other risks.  Hepatitis C blood test.  Hepatitis B blood test.  Sexually transmitted disease (STD) testing.  Diabetes screening. This is done by checking your blood sugar (glucose) after you have not eaten for a while (fasting). You may have this done every 1-3 years.  Abdominal aortic aneurysm (AAA) screening. You may need this if you are a current or former smoker.  Osteoporosis. You may be screened starting at age 90 if you are at high risk. Talk  with your health care provider about your test results, treatment options, and if necessary, the need for more tests. Vaccines  Your health care provider may recommend certain vaccines, such as:  Influenza vaccine. This is recommended every year.  Tetanus, diphtheria, and acellular  pertussis (Tdap, Td) vaccine. You may need a Td booster every 10 years.  Zoster vaccine. You may need this after age 18.  Pneumococcal 13-valent conjugate (PCV13) vaccine. One dose is recommended after age 25.  Pneumococcal polysaccharide (PPSV23) vaccine. One dose is recommended after age 89. Talk to your health care provider about which screenings and vaccines you need and how often you need them. This information is not intended to replace advice given to you by your health care provider. Make sure you discuss any questions you have with your health care provider. Document Released: 11/21/2015 Document Revised: 07/14/2016 Document Reviewed: 08/26/2015 Elsevier Interactive Patient Education  2017 [place] Prevention in the Home Falls can cause injuries. They can happen to people of all ages. There are many things you can do to make your home safe and to help prevent falls. What can I do on the outside of my home?  Regularly fix the edges of walkways and driveways and fix any cracks.  Remove anything that might make you trip as you walk through a door, such as a raised step or threshold.  Trim any bushes or trees on the path to your home.  Use bright outdoor lighting.  Clear any walking paths of anything that might make someone trip, such as rocks or tools.  Regularly check to see if handrails are loose or broken. Make sure that both sides of any steps have handrails.  Any raised decks and porches should have guardrails on the edges.  Have any leaves, snow, or ice cleared regularly.  Use sand or salt on walking paths during winter.  Clean up any spills in your garage right away. This includes oil or grease spills. What can I do in the bathroom?  Use night lights.  Install grab bars by the toilet and in the tub and shower. Do not use towel bars as grab bars.  Use non-skid mats or decals in the tub or shower.  If you need to sit down in the shower, use a plastic,  non-slip stool.  Keep the floor dry. Clean up any water that spills on the floor as soon as it happens.  Remove soap buildup in the tub or shower regularly.  Attach bath mats securely with double-sided non-slip rug tape.  Do not have throw rugs and other things on the floor that can make you trip. What can I do in the bedroom?  Use night lights.  Make sure that you have a light by your bed that is easy to reach.  Do not use any sheets or blankets that are too big for your bed. They should not hang down onto the floor.  Have a firm chair that has side arms. You can use this for support while you get dressed.  Do not have throw rugs and other things on the floor that can make you trip. What can I do in the kitchen?  Clean up any spills right away.  Avoid walking on wet floors.  Keep items that you use a lot in easy-to-reach places.  If you need to reach something above you, use a strong step stool that has a grab bar.  Keep electrical cords out of the way.  Do  not use floor polish or wax that makes floors slippery. If you must use wax, use non-skid floor wax.  Do not have throw rugs and other things on the floor that can make you trip. What can I do with my stairs?  Do not leave any items on the stairs.  Make sure that there are handrails on both sides of the stairs and use them. Fix handrails that are broken or loose. Make sure that handrails are as long as the stairways.  Check any carpeting to make sure that it is firmly attached to the stairs. Fix any carpet that is loose or worn.  Avoid having throw rugs at the top or bottom of the stairs. If you do have throw rugs, attach them to the floor with carpet tape.  Make sure that you have a light switch at the top of the stairs and the bottom of the stairs. If you do not have them, ask someone to add them for you. What else can I do to help prevent falls?  Wear shoes that:  Do not have high heels.  Have rubber  bottoms.  Are comfortable and fit you well.  Are closed at the toe. Do not wear sandals.  If you use a stepladder:  Make sure that it is fully opened. Do not climb a closed stepladder.  Make sure that both sides of the stepladder are locked into place.  Ask someone to hold it for you, if possible.  Clearly mark and make sure that you can see:  Any grab bars or handrails.  First and last steps.  Where the edge of each step is.  Use tools that help you move around (mobility aids) if they are needed. These include:  Canes.  Walkers.  Scooters.  Crutches.  Turn on the lights when you go into a dark area. Replace any light bulbs as soon as they burn out.  Set up your furniture so you have a clear path. Avoid moving your furniture around.  If any of your floors are uneven, fix them.  If there are any pets around you, be aware of where they are.  Review your medicines with your doctor. Some medicines can make you feel dizzy. This can increase your chance of falling. Ask your doctor what other things that you can do to help prevent falls. This information is not intended to replace advice given to you by your health care provider. Make sure you discuss any questions you have with your health care provider. Document Released: 08/21/2009 Document Revised: 04/01/2016 Document Reviewed: 11/29/2014 Elsevier Interactive Patient Education  2017 [employer].

## 2021-04-03 ENCOUNTER — Encounter: Payer: Self-pay | Admitting: Adult Health

## 2021-06-12 ENCOUNTER — Other Ambulatory Visit: Payer: Self-pay

## 2021-06-12 ENCOUNTER — Ambulatory Visit (HOSPITAL_COMMUNITY): Payer: Medicare HMO | Attending: Cardiovascular Disease

## 2021-06-12 DIAGNOSIS — R0602 Shortness of breath: Secondary | ICD-10-CM | POA: Insufficient documentation

## 2021-06-12 LAB — ECHOCARDIOGRAM COMPLETE
Area-P 1/2: 1.55 cm2
P 1/2 time: 735 msec
S' Lateral: 3.6 cm

## 2021-06-15 DIAGNOSIS — R0602 Shortness of breath: Secondary | ICD-10-CM

## 2021-06-15 DIAGNOSIS — R0609 Other forms of dyspnea: Secondary | ICD-10-CM

## 2021-06-16 ENCOUNTER — Telehealth: Payer: Self-pay | Admitting: Cardiovascular Disease

## 2021-06-16 NOTE — Telephone Encounter (Signed)
Pt wife Boss Kubik informed of providers result & recommendations. Pt verbalized understanding. No further questions . Will continue to monitor BP

## 2021-06-16 NOTE — Telephone Encounter (Signed)
Patient was calling in to get results for test. Please advise

## 2021-07-22 ENCOUNTER — Other Ambulatory Visit: Payer: Self-pay | Admitting: Student

## 2021-07-22 NOTE — Telephone Encounter (Signed)
This is Dr. Berry's pt 

## 2021-09-07 ENCOUNTER — Telehealth: Payer: Self-pay | Admitting: Cardiovascular Disease

## 2021-09-07 DIAGNOSIS — E785 Hyperlipidemia, unspecified: Secondary | ICD-10-CM

## 2021-09-07 MED ORDER — AMLODIPINE BESYLATE 5 MG PO TABS
5.0000 mg | ORAL_TABLET | Freq: Every day | ORAL | 1 refills | Status: DC
Start: 2021-09-07 — End: 2021-09-21

## 2021-09-07 MED ORDER — EZETIMIBE 10 MG PO TABS: 10.0000 mg | ORAL_TABLET | Freq: Every day | ORAL | 1 refills | Status: DC

## 2021-09-07 NOTE — Telephone Encounter (Signed)
Refill sent 09/07/21

## 2021-09-07 NOTE — Telephone Encounter (Signed)
*  STAT* If patient is at the pharmacy, call can be transferred to refill team.   1. Which medications need to be refilled? (please list name of each medication and dose if known)  ezetimibe (ZETIA) 10 MG tablet  amLODipine (NORVASC) 5 MG tablet  2. Which pharmacy/location (including street and city if local pharmacy) is medication to be sent to? [place], [place] - [address]  3. Do they need a 30 day or 90 day supply?  90 day supply  Patient states he is completely out of Ezetimibe

## 2021-09-15 DIAGNOSIS — R972 Elevated prostate specific antigen [PSA]: Secondary | ICD-10-CM | POA: Diagnosis not present

## 2021-09-21 ENCOUNTER — Other Ambulatory Visit: Payer: Self-pay | Admitting: *Deleted

## 2021-09-21 DIAGNOSIS — E785 Hyperlipidemia, unspecified: Secondary | ICD-10-CM

## 2021-09-21 MED ORDER — AMLODIPINE BESYLATE 5 MG PO TABS
5.0000 mg | ORAL_TABLET | Freq: Every day | ORAL | 3 refills | Status: DC
Start: 2021-09-21 — End: 2022-12-03

## 2021-09-21 MED ORDER — EZETIMIBE 10 MG PO TABS: 10.0000 mg | ORAL_TABLET | Freq: Every day | ORAL | 3 refills | Status: AC

## 2021-10-18 ENCOUNTER — Other Ambulatory Visit: Payer: Self-pay | Admitting: Cardiovascular Disease

## 2021-11-10 ENCOUNTER — Encounter (INDEPENDENT_AMBULATORY_CARE_PROVIDER_SITE_OTHER): Payer: Self-pay

## 2021-11-10 ENCOUNTER — Ambulatory Visit (HOSPITAL_COMMUNITY): Payer: Medicare HMO | Attending: General Practice

## 2021-11-10 ENCOUNTER — Other Ambulatory Visit: Payer: Self-pay

## 2021-11-10 DIAGNOSIS — R0602 Shortness of breath: Secondary | ICD-10-CM | POA: Diagnosis not present

## 2021-11-10 DIAGNOSIS — R0609 Other forms of dyspnea: Secondary | ICD-10-CM | POA: Diagnosis not present

## 2021-11-10 LAB — ECHOCARDIOGRAM COMPLETE
Area-P 1/2: 2.33 cm2
P 1/2 time: 764 msec
S' Lateral: 3.6 cm

## 2021-11-11 ENCOUNTER — Other Ambulatory Visit: Payer: Self-pay

## 2021-11-11 DIAGNOSIS — I080 Rheumatic disorders of both mitral and aortic valves: Secondary | ICD-10-CM

## 2021-11-11 DIAGNOSIS — I5189 Other ill-defined heart diseases: Secondary | ICD-10-CM

## 2021-12-23 ENCOUNTER — Other Ambulatory Visit: Payer: Self-pay

## 2021-12-23 ENCOUNTER — Encounter: Payer: Self-pay | Admitting: Cardiovascular Disease

## 2021-12-23 ENCOUNTER — Ambulatory Visit: Payer: Medicare HMO | Admitting: Cardiovascular Disease

## 2021-12-23 DIAGNOSIS — I10 Essential (primary) hypertension: Secondary | ICD-10-CM | POA: Diagnosis not present

## 2021-12-23 DIAGNOSIS — I7121 Aneurysm of the ascending aorta, without rupture: Secondary | ICD-10-CM

## 2021-12-23 DIAGNOSIS — I214 Non-ST elevation (NSTEMI) myocardial infarction: Secondary | ICD-10-CM

## 2021-12-23 DIAGNOSIS — E785 Hyperlipidemia, unspecified: Secondary | ICD-10-CM | POA: Diagnosis not present

## 2021-12-23 DIAGNOSIS — I712 Thoracic aortic aneurysm, without rupture, unspecified: Secondary | ICD-10-CM | POA: Insufficient documentation

## 2021-12-23 LAB — LIPID PANEL
Chol/HDL Ratio: 2 ratio (ref 0.0–5.0)
Cholesterol, Total: 128 mg/dL (ref 100–199)
HDL: 65 mg/dL (ref >39)
LDL Chol Calc (NIH): 53 mg/dL (ref 0–99)
Triglycerides: 38 mg/dL (ref 0–149)
VLDL Cholesterol Cal: 10 mg/dL (ref 5–40)

## 2021-12-23 LAB — HEPATIC FUNCTION PANEL
ALT: 30 IU/L (ref 0–44)
AST: 30 IU/L (ref 0–40)
Albumin: 4.4 g/dL (ref 3.8–4.8)
Alkaline Phosphatase: 76 IU/L (ref 44–121)
Bilirubin Total: 1.1 mg/dL (ref 0.0–1.2)
Bilirubin, Direct: 0.31 mg/dL (ref 0.00–0.40)
Total Protein: 6.7 g/dL (ref 6.0–8.5)

## 2021-12-23 NOTE — Assessment & Plan Note (Signed)
History of CAD status post cardiac catheterization which I did 11/04/2003 which was completely normal.  He then presented with a non-STEMI 09/25/2018 and underwent cardiac catheterization by Dr. Irish Lack 09/25/2018 demonstrating an occluded dominant RCA with left-to-right collaterals.  He was stented using a 3.5 mm x 20 mm long Synergy drug-eluting stent with excellent result.  He was discharged home on aspirin Brilinta but because of shortness of breath this was transitioned to Plavix.  Because of recurrent symptoms I performed repeat catheterization on him 12/11/2018 which was entirely normal.  He also had a subsequent negative 2D echo and Myoview stress test.  He does complain of some dyspnea on exertion but denies chest pain.

## 2021-12-23 NOTE — Progress Notes (Addendum)
12/23/2021 Edward Freeman   08/07/1951  [id number]  Primary Physician Edward Cater, Tommi Rumps, NP Primary Cardiologist: Edward Harp MD Edward Freeman, [place]  HPI:  Edward Freeman is a 71 y.o.  married African American male father of 2, grandfather to 2 grandchildren he works doing Furniture conservator/restorer. I last saw him in the office 09/12/2020. I performed cardiac catheterization on him 11/04/03 which was completely normal. His cardiac risk factors include hypertension as well as family history with a mother who died of a myocardial infarction at age 38. He denies chest pain or shortness of breath.  He also had a small apical nodule by CT scan several years ago with recommendations of followup which was never pursued. I've offered him follow-up CT scans which she has declined because of fiscal constraints.   He presented on 09/25/2018 with a non-STEMI.  His troponin rose to 15.  He underwent cardiac catheterization by Dr. Irish Lack 09/25/2018 demonstrating an occluded dominant RCA with left-to-right collaterals.  He was stented with a synergy 3.5 mm x 20 mm long drug-eluting stent with excellent result.    He was on aspirin and Brilinta however because of shortness of breath Brilinta this was transitioned to Plavix.  Because of recurrent symptoms similar to his preintervention symptoms I performed cardiac catheterization on him 12/11/2018 in the right radial approach revealing a widely patent RCA stent with no other significant disease.   Since I saw him a year ago he has complained of some dyspnea on exertion but denies chest pain.  He did have a normal 2D echo and negative Myoview 08/26/2020 and again a normal 2D echo 11/10/2021.  He enjoys fishing and doing yard work.   Current Meds  Medication Sig   amLODipine (NORVASC) 5 MG tablet Take 1 tablet (5 mg total) by mouth daily.   aspirin 81 MG tablet Take 1 tablet (81 mg total) by mouth daily. (Patient taking differently: Take 81 mg by mouth daily  after lunch.)   atorvastatin (LIPITOR) 80 MG tablet Take 1 tablet by mouth once daily   nitroGLYCERIN (NITROSTAT) 0.4 MG SL tablet Place 1 tablet (0.4 mg total) under the tongue every 5 (five) minutes as needed for chest pain (up to 3 doses. If taking 3rd dose call 911).   tamsulosin (FLOMAX) 0.4 MG CAPS capsule Take 1 capsule by mouth daily.     No Known Allergies  Social History   Socioeconomic History   Marital status: Married    Spouse name: Not on file   Number of children: Not on file   Years of education: Not on file   Highest education level: Not on file  Occupational History   Not on file  Tobacco Use   Smoking status: Never   Smokeless tobacco: Never  Vaping Use   Vaping Use: Never used  Substance and Sexual Activity   Alcohol use: No    Comment: 1-2 beers a month   Drug use: No   Sexual activity: Not on file  Other Topics Concern   Not on file  Social History Narrative   Not on file   Social Determinants of Health   Financial Resource Strain: Low Risk    Difficulty of Paying Living Expenses: Not hard at all  Food Insecurity: No Food Insecurity   Worried About Estate manager/land agent of Food in the Last Year: Never true   [place] in the Last Year: Never true  Transportation Needs: No Transportation Needs  Lack of Transportation (Medical): No   Lack of Transportation (Non-Medical): No  Physical Activity: Insufficiently Active   Days of Exercise per Week: 2 days   Minutes of Exercise per Session: 20 min  Stress: No Stress Concern Present   Feeling of Stress : Not at all  Social Connections: Moderately Isolated   Frequency of Communication with Friends and Family: Three times a week   Frequency of Social Gatherings with Friends and Family: Three times a week   Attends Religious Services: Never   Active Member of Clubs or Organizations: No   Attends Archivist Meetings: Never   Marital Status: Married  Human resources officer Violence: Not At Risk   Fear  of Current or Ex-Partner: No   Emotionally Abused: No   Physically Abused: No   Sexually Abused: No     Review of Systems: General: negative for chills, fever, night sweats or weight changes.  Cardiovascular: negative for chest pain, dyspnea on exertion, edema, orthopnea, palpitations, paroxysmal nocturnal dyspnea or shortness of breath Dermatological: negative for rash Respiratory: negative for cough or wheezing Urologic: negative for hematuria Abdominal: negative for nausea, vomiting, diarrhea, bright red blood per rectum, melena, or hematemesis Neurologic: negative for visual changes, syncope, or dizziness All other systems reviewed and are otherwise negative except as noted above.    Blood pressure 120/78, pulse (!) 51, height 5\' 10"  (1.778 m), weight 206 lb (93.4 kg), SpO2 99 %.  General appearance: alert and no distress Neck: no adenopathy, no carotid bruit, no JVD, supple, symmetrical, trachea midline, and thyroid not enlarged, symmetric, no tenderness/mass/nodules Lungs: clear to auscultation bilaterally Heart: regular rate and rhythm, S1, S2 normal, no murmur, click, rub or gallop Extremities: extremities normal, atraumatic, no cyanosis or edema Pulses: 2+ and symmetric Skin: Skin color, texture, turgor normal. No rashes or lesions Neurologic: Grossly normal  EKG sinus bradycardia 51 without ST or T wave changes.  I personally reviewed this EKG.  ASSESSMENT AND PLAN:   Essential hypertension History of essential hypertension a blood pressure measured today at 158/72.  He is on amlodipine.  Dyslipidemia, goal LDL below 70 History of dyslipidemia on high-dose atorvastatin and Zetia.  His last lipid profile performed 01/01/2021 revealed total cholesterol 116, LDL 52 and HDL of 55.  We will recheck a fasting lipid and liver profile this morning.  NSTEMI (non-ST elevated myocardial infarction) ([place]) History of CAD status post cardiac catheterization which I did 11/04/2003  which was completely normal.  He then presented with a non-STEMI 09/25/2018 and underwent cardiac catheterization by Dr. Irish Lack 09/25/2018 demonstrating an occluded dominant RCA with left-to-right collaterals.  He was stented using a 3.5 mm x 20 mm long Synergy drug-eluting stent with excellent result.  He was discharged home on aspirin Brilinta but because of shortness of breath this was transitioned to Plavix.  Because of recurrent symptoms I performed repeat catheterization on him 12/11/2018 which was entirely normal.  He also had a subsequent negative 2D echo and Myoview stress test.  He does complain of some dyspnea on exertion but denies chest pain.  Thoracic aortic aneurysm 2D echocardiogram performed 11/10/2021 revealed his ascending aorta measuring 42 mm.  This will be repeated on an annual basis.     Edward Harp MD FACP,FACC,FAHA, [facility] 12/23/2021 10:03 AM

## 2021-12-23 NOTE — Assessment & Plan Note (Signed)
2D echocardiogram performed 11/10/2021 revealed his ascending aorta measuring 42 mm.  This will be repeated on an annual basis.

## 2021-12-23 NOTE — Assessment & Plan Note (Signed)
History of dyslipidemia on high-dose atorvastatin and Zetia.  His last lipid profile performed 01/01/2021 revealed total cholesterol 116, LDL 52 and HDL of 55.  We will recheck a fasting lipid and liver profile this morning.

## 2021-12-23 NOTE — Patient Instructions (Signed)
Medication Instructions:  Your physician recommends that you continue on your current medications as directed. Please refer to the Current Medication list given to you today.  *If you need a refill on your cardiac medications before your next appointment, please call your pharmacy*   Lab Work: Your physician recommends that you have labs drawn today: Lipid/liver profile  If you have labs (blood work) drawn today and your tests are completely normal, you will receive your results only by: [place] (if you have MyChart) OR A paper copy in the mail If you have any lab test that is abnormal or we need to change your treatment, we will call you to review the results.   Follow-Up: At [facility], you and your health needs are our priority.  As part of our continuing mission to provide you with exceptional heart care, we have created designated Provider Care Teams.  These Care Teams include your primary Cardiologist (physician) and Advanced Practice Providers (APPs -  Physician Assistants and Nurse Practitioners) who all work together to provide you with the care you need, when you need it.  We recommend signing up for the patient portal called "MyChart".  Sign up information is provided on this After Visit Summary.  MyChart is used to connect with patients for Virtual Visits (Telemedicine).  Patients are able to view lab/test results, encounter notes, upcoming appointments, etc.  Non-urgent messages can be sent to your provider as well.   To learn more about what you can do with MyChart, go to [email or web address].    Your next appointment:   6 month(s)  The format for your next appointment:   In Person  Provider:   Sande Rives, PA-C      Then, Quay Burow, MD will plan to see you again in 12 month(s).

## 2021-12-23 NOTE — Assessment & Plan Note (Signed)
History of essential hypertension a blood pressure measured today at 158/72.  He is on amlodipine.

## 2022-03-12 DIAGNOSIS — R972 Elevated prostate specific antigen [PSA]: Secondary | ICD-10-CM | POA: Diagnosis not present

## 2022-03-15 LAB — PSA: PSA: 5.52

## 2022-03-18 ENCOUNTER — Encounter: Payer: Self-pay | Admitting: Adult Health

## 2022-04-13 ENCOUNTER — Ambulatory Visit (INDEPENDENT_AMBULATORY_CARE_PROVIDER_SITE_OTHER): Payer: Medicare HMO

## 2022-04-13 VITALS — BP 124/70 | HR 52 | Temp 98.0°F | Ht 69.5 in | Wt 202.9 lb

## 2022-04-13 DIAGNOSIS — Z0000 Encounter for general adult medical examination without abnormal findings: Secondary | ICD-10-CM | POA: Diagnosis not present

## 2022-04-13 NOTE — Progress Notes (Signed)
Subjective:   Edward Freeman is a 71 y.o. male who presents for Medicare Annual/Subsequent preventive examination.  Review of Systems     Cardiac Risk Factors include: advanced age (>97mn, >>78women);dyslipidemia;hypertension;male gender     Objective:    Today's Vitals   04/13/22 0910  BP: 124/70  Pulse: (!) 52  Temp: 98 F (36.7 C)  TempSrc: Oral  SpO2: 98%  Weight: 202 lb 14.4 oz (92 kg)  Height: 5' 9.5" (1.765 m)   Body mass index is 29.53 kg/m.     04/13/2022    9:18 AM 04/01/2021    8:31 AM 01/09/2019    8:55 AM 12/11/2018   10:50 AM 09/24/2018    5:58 PM 09/24/2018   12:37 PM  Advanced Directives  Does Patient Have a Medical Advance Directive? No No No No No No  Would patient like information on creating a medical advance directive? Yes (MAU/Ambulatory/Procedural Areas - Information given) Yes (MAU/Ambulatory/Procedural Areas - Information given) No - Patient declined No - Patient declined No - Patient declined No - Patient declined    Current Medications (verified) Outpatient Encounter Medications as of 04/13/2022  Medication Sig   amLODipine (NORVASC) 5 MG tablet Take 1 tablet (5 mg total) by mouth daily.   aspirin 81 MG tablet Take 1 tablet (81 mg total) by mouth daily. (Patient taking differently: Take 81 mg by mouth daily after lunch.)   atorvastatin (LIPITOR) 80 MG tablet Take 1 tablet by mouth once daily   nitroGLYCERIN (NITROSTAT) 0.4 MG SL tablet Place 1 tablet (0.4 mg total) under the tongue every 5 (five) minutes as needed for chest pain (up to 3 doses. If taking 3rd dose call 911).   tamsulosin (FLOMAX) 0.4 MG CAPS capsule Take 1 capsule by mouth daily.   ezetimibe (ZETIA) 10 MG tablet Take 1 tablet (10 mg total) by mouth daily.   No facility-administered encounter medications on file as of 04/13/2022.    Allergies (verified) Patient has no known allergies.   History: Past Medical History:  Diagnosis Date   CAD (coronary artery disease)    a.  NSTEMI 09/2018 s/p DES to RCA, EF 55-60%.   GERD (gastroesophageal reflux disease)    Hyperlipidemia    Hypertension    Migraines    Nodule of left lung    PSVT (paroxysmal supraventricular tachycardia) (HCC)    Sinus bradycardia    Syncope and collapse    Past Surgical History:  Procedure Laterality Date   CORONARY STENT INTERVENTION N/A 09/25/2018   Procedure: CORONARY STENT INTERVENTION;  Surgeon: VJettie Booze MD;  [facility];  Service: Cardiovascular;  Laterality: N/A;   LEFT HEART CATH AND CORONARY ANGIOGRAPHY N/A 09/25/2018   Procedure: LEFT HEART CATH AND CORONARY ANGIOGRAPHY;  Surgeon: VJettie Booze MD;  [facility];  Service: Cardiovascular;  Laterality: N/A;   LEFT HEART CATH AND CORONARY ANGIOGRAPHY N/A 12/11/2018   Procedure: LEFT HEART CATH AND CORONARY ANGIOGRAPHY;  Surgeon: BLorretta Harp MD;  [facility];  Service: Cardiovascular;  Laterality: N/A;   Family History  Problem Relation Age of Onset   Hypertension Mother    Stroke Mother    Alcohol abuse Father    Diabetes Sister    Diabetes Brother    Diabetes Sister    Colon cancer Neg Hx    Esophageal cancer Neg Hx    Rectal cancer Neg Hx    Stomach cancer Neg Hx  Social History   Socioeconomic History   Marital status: Married    Spouse name: Not on file   Number of children: Not on file   Years of education: Not on file   Highest education level: Not on file  Occupational History   Not on file  Tobacco Use   Smoking status: Never   Smokeless tobacco: Never  Vaping Use   Vaping Use: Never used  Substance and Sexual Activity   Alcohol use: No    Comment: 1-2 beers a month   Drug use: No   Sexual activity: Not on file  Other Topics Concern   Not on file  Social History Narrative   Not on file   Social Determinants of Health   Financial Resource Strain: Low Risk    Difficulty of Paying Living Expenses: Not hard at all   Food Insecurity: No Food Insecurity   Worried About Charity fundraiser in the Last Year: Never true   [place] in the Last Year: Never true  Transportation Needs: No Transportation Needs   Lack of Transportation (Medical): No   Lack of Transportation (Non-Medical): No  Physical Activity: Inactive   Days of Exercise per Week: 0 days   Minutes of Exercise per Session: 0 min  Stress: No Stress Concern Present   Feeling of Stress : Not at all  Social Connections: Not on file    Tobacco Counseling Counseling given: Not Answered   Clinical Intake:  Pre-visit preparation completed: Yes        Nutritional Status: BMI 25 -29 Overweight Nutritional Risks: None Diabetes: No  How often do you need to have someone help you when you read instructions, pamphlets, or other written materials from your doctor or pharmacy?: 1 - Never What is the last grade level you completed in school?: 12th grade  Diabetic? no  Interpreter Needed?: No  Information entered by :: NAllen LPN   Activities of Daily Living    04/13/2022    9:19 AM  In your present state of health, do you have any difficulty performing the following activities:  Hearing? 0  Vision? 0  Difficulty concentrating or making decisions? 0  Walking or climbing stairs? 0  Dressing or bathing? 0  Doing errands, shopping? 0  Preparing Food and eating ? N  Using the Toilet? N  In the past six months, have you accidently leaked urine? N  Do you have problems with loss of bowel control? N  Managing your Medications? N  Managing your Finances? N  Housekeeping or managing your Housekeeping? N    Patient Care Team: Dorothyann Peng, NP as PCP - General (Family Medicine) Lorretta Harp, MD as PCP - Cardiology (Cardiology) Gerda Diss, DO as Consulting Physician (Sports Medicine) Ceasar Mons, MD as Consulting Physician (Urology)  Indicate any recent Medical Services you may have received from other  than [facility] providers in the past year (date may be approximate).     Assessment:   This is a routine wellness examination for Edward Freeman.  Hearing/Vision screen Vision Screening - Comments:: No regular eye exams  Dietary issues and exercise activities discussed: Current Exercise Habits: The patient does not participate in regular exercise at present   Goals Addressed             This Visit's Progress    Patient Stated       04/13/2022, no goals       Depression Screen    04/13/2022  9:19 AM 04/01/2021    8:30 AM 01/01/2021    7:56 AM 01/01/2020    8:05 AM  PHQ 2/9 Scores  PHQ - 2 Score 0 0 0 0  PHQ- 9 Score    0    Fall Risk    04/13/2022    9:19 AM 04/01/2021    8:32 AM 01/01/2021    7:56 AM 01/01/2020    8:05 AM  Fall Risk   Falls in the past year? 0 0 0 0  Number falls in past yr: 0 0  0  Injury with Fall? 0 0  0  Risk for fall due to : Medication side effect Impaired vision    Follow up Falls evaluation completed;Education provided;Falls prevention discussed Falls prevention discussed      FALL RISK PREVENTION PERTAINING TO THE HOME:  Any stairs in or around the home? Yes  If so, are there any without handrails? No  Home free of loose throw rugs in walkways, pet beds, electrical cords, etc? Yes  Adequate lighting in your home to reduce risk of falls? Yes   ASSISTIVE DEVICES UTILIZED TO PREVENT FALLS:  Life alert? No  Use of a cane, walker or w/c? No  Grab bars in the bathroom? No  Shower chair or bench in shower? No  Elevated toilet seat or a handicapped toilet? Yes   TIMED UP AND GO:  Was the test performed? No .    Gait steady and fast without use of assistive device  Cognitive Function:        04/13/2022    9:20 AM 04/01/2021    8:35 AM  6CIT Screen  What Year? 0 points 0 points  What month? 0 points 0 points  What time? 0 points 0 points  Count back from 20 0 points 0 points  Months in reverse 4 points 0 points  Repeat phrase 6 points 10  points  Total Score 10 points 10 points    Immunizations Immunization History  Administered Date(s) Administered   Fluad Quad(high Dose 65+) 01/01/2021   PFIZER(Purple Top)SARS-COV-2 Vaccination 12/15/2019, 01/05/2020, 10/28/2020   Pneumococcal Conjugate-13 05/30/2017    TDAP status: Up to date  Flu Vaccine status: Up to date  Pneumococcal vaccine status: Up to date  Covid-19 vaccine status: Completed vaccines  Qualifies for Shingles Vaccine? Yes   Zostavax completed No   Shingrix Completed?: No.    Education has been provided regarding the importance of this vaccine. Patient has been advised to call insurance company to determine out of pocket expense if they have not yet received this vaccine. Advised may also receive vaccine at local pharmacy or Health Dept. Verbalized acceptance and understanding.  Screening Tests Health Maintenance  Topic Date Due   Zoster Vaccines- Shingrix (1 of 2) Never done   Pneumonia Vaccine 52+ Years old (2 - PPSV23 if available, else PCV20) 05/30/2018   COVID-19 Vaccine (4 - Booster for Pfizer series) 12/23/2020   INFLUENZA VACCINE  06/08/2022   COLONOSCOPY (Pts 45-96yr Insurance coverage will need to be confirmed)  06/14/2022   Hepatitis C Screening  Completed   HPV VACCINES  Aged Out    Health Maintenance  Health Maintenance Due  Topic Date Due   Zoster Vaccines- Shingrix (1 of 2) Never done   Pneumonia Vaccine 71 Years old (2 - PPSV23 if available, else PCV20) 05/30/2018   COVID-19 Vaccine (4 - Booster for [place]) 12/23/2020    Colorectal cancer screening: Type of screening: Colonoscopy. Completed 06/14/2017.  Repeat every 5 years  Lung Cancer Screening: (Low Dose CT Chest recommended if Age 47-80 years, 30 pack-year currently smoking OR have quit w/in 15years.) does not qualify.   Lung Cancer Screening Referral: no  Additional Screening:  Hepatitis C Screening: does qualify; Completed 01/01/2021  Vision Screening:  Recommended annual ophthalmology exams for early detection of glaucoma and other disorders of the eye. Is the patient up to date with their annual eye exam?  No  Who is the provider or what is the name of the office in which the patient attends annual eye exams? none If pt is not established with a provider, would they like to be referred to a provider to establish care? No .   Dental Screening: Recommended annual dental exams for proper oral hygiene  Community Resource Referral / Chronic Care Management: CRR required this visit?  No   CCM required this visit?  No      Plan:     I have personally reviewed and noted the following in the patient's chart:   Medical and social history Use of alcohol, tobacco or illicit drugs  Current medications and supplements including opioid prescriptions. Patient is not currently taking opioid prescriptions. Functional ability and status Nutritional status Physical activity Advanced directives List of other physicians Hospitalizations, surgeries, and ER visits in previous 12 months Vitals Screenings to include cognitive, depression, and falls Referrals and appointments  In addition, I have reviewed and discussed with patient certain preventive protocols, quality metrics, and best practice recommendations. A written personalized care plan for preventive services as well as general preventive health recommendations were provided to patient.     Kellie Simmering, LPN   [id number]/[id number]   Nurse Notes: none

## 2022-04-13 NOTE — Patient Instructions (Addendum)
[name] , Thank you for taking time to come for your Medicare Wellness Visit. I appreciate your ongoing commitment to your health goals. Please review the following plan we discussed and let me know if I can assist you in the future.   Screening recommendations/referrals: Colonoscopy: completed 06/14/2017, due 06/14/2022 Recommended yearly ophthalmology/optometry visit for glaucoma screening and checkup Recommended yearly dental visit for hygiene and checkup  Vaccinations: Influenza vaccine: due 06/08/2022 Pneumococcal vaccine: completed 05/30/2017 Tdap vaccine: due Shingles vaccine: discussed   Covid-19:  10/28/2020, 01/05/2020, 12/15/2019  Advanced directives: Advance directive discussed with you today. I have provided a copy for you to complete at home and have notarized. Once this is complete please bring a copy in to our office so we can scan it into your chart.   Conditions/risks identified: none  Next appointment: Follow up in one year for your annual wellness visit.   Preventive Care 53 Years and Older, Male Preventive care refers to lifestyle choices and visits with your health care provider that can promote health and wellness. What does preventive care include? A yearly physical exam. This is also called an annual well check. Dental exams once or twice a year. Routine eye exams. Ask your health care provider how often you should have your eyes checked. Personal lifestyle choices, including: Daily care of your teeth and gums. Regular physical activity. Eating a healthy diet. Avoiding tobacco and drug use. Limiting alcohol use. Practicing safe sex. Taking low doses of aspirin every day. Taking vitamin and mineral supplements as recommended by your health care provider. What happens during an annual well check? The services and screenings done by your health care provider during your annual well check will depend on your age, overall health, lifestyle risk factors, and family  history of disease. Counseling  Your health care provider may ask you questions about your: Alcohol use. Tobacco use. Drug use. Emotional well-being. Home and relationship well-being. Sexual activity. Eating habits. History of falls. Memory and ability to understand (cognition). Work and work Statistician. Screening  You may have the following tests or measurements: Height, weight, and BMI. Blood pressure. Lipid and cholesterol levels. These may be checked every 5 years, or more frequently if you are over 23 years old. Skin check. Lung cancer screening. You may have this screening every year starting at age 85 if you have a 30-pack-year history of smoking and currently smoke or have quit within the past 15 years. Fecal occult blood test (FOBT) of the stool. You may have this test every year starting at age 58. Flexible sigmoidoscopy or colonoscopy. You may have a sigmoidoscopy every 5 years or a colonoscopy every 10 years starting at age 37. Prostate cancer screening. Recommendations will vary depending on your family history and other risks. Hepatitis C blood test. Hepatitis B blood test. Sexually transmitted disease (STD) testing. Diabetes screening. This is done by checking your blood sugar (glucose) after you have not eaten for a while (fasting). You may have this done every 1-3 years. Abdominal aortic aneurysm (AAA) screening. You may need this if you are a current or former smoker. Osteoporosis. You may be screened starting at age 33 if you are at high risk. Talk with your health care provider about your test results, treatment options, and if necessary, the need for more tests. Vaccines  Your health care provider may recommend certain vaccines, such as: Influenza vaccine. This is recommended every year. Tetanus, diphtheria, and acellular pertussis (Tdap, Td) vaccine. You may need a Td  booster every 10 years. Zoster vaccine. You may need this after age 52. Pneumococcal  13-valent conjugate (PCV13) vaccine. One dose is recommended after age 38. Pneumococcal polysaccharide (PPSV23) vaccine. One dose is recommended after age 38. Talk to your health care provider about which screenings and vaccines you need and how often you need them. This information is not intended to replace advice given to you by your health care provider. Make sure you discuss any questions you have with your health care provider. Document Released: 11/21/2015 Document Revised: 07/14/2016 Document Reviewed: 08/26/2015 Elsevier Interactive Patient Education  2017 [employer] Prevention in the Home Falls can cause injuries. They can happen to people of all ages. There are many things you can do to make your home safe and to help prevent falls. What can I do on the outside of my home? Regularly fix the edges of walkways and driveways and fix any cracks. Remove anything that might make you trip as you walk through a door, such as a raised step or threshold. Trim any bushes or trees on the path to your home. Use bright outdoor lighting. Clear any walking paths of anything that might make someone trip, such as rocks or tools. Regularly check to see if handrails are loose or broken. Make sure that both sides of any steps have handrails. Any raised decks and porches should have guardrails on the edges. Have any leaves, snow, or ice cleared regularly. Use sand or salt on walking paths during winter. Clean up any spills in your garage right away. This includes oil or grease spills. What can I do in the bathroom? Use night lights. Install grab bars by the toilet and in the tub and shower. Do not use towel bars as grab bars. Use non-skid mats or decals in the tub or shower. If you need to sit down in the shower, use a plastic, non-slip stool. Keep the floor dry. Clean up any water that spills on the floor as soon as it happens. Remove soap buildup in the tub or shower regularly. Attach  bath mats securely with double-sided non-slip rug tape. Do not have throw rugs and other things on the floor that can make you trip. What can I do in the bedroom? Use night lights. Make sure that you have a light by your bed that is easy to reach. Do not use any sheets or blankets that are too big for your bed. They should not hang down onto the floor. Have a firm chair that has side arms. You can use this for support while you get dressed. Do not have throw rugs and other things on the floor that can make you trip. What can I do in the kitchen? Clean up any spills right away. Avoid walking on wet floors. Keep items that you use a lot in easy-to-reach places. If you need to reach something above you, use a strong step stool that has a grab bar. Keep electrical cords out of the way. Do not use floor polish or wax that makes floors slippery. If you must use wax, use non-skid floor wax. Do not have throw rugs and other things on the floor that can make you trip. What can I do with my stairs? Do not leave any items on the stairs. Make sure that there are handrails on both sides of the stairs and use them. Fix handrails that are broken or loose. Make sure that handrails are as long as the stairways. Check any carpeting  to make sure that it is firmly attached to the stairs. Fix any carpet that is loose or worn. Avoid having throw rugs at the top or bottom of the stairs. If you do have throw rugs, attach them to the floor with carpet tape. Make sure that you have a light switch at the top of the stairs and the bottom of the stairs. If you do not have them, ask someone to add them for you. What else can I do to help prevent falls? Wear shoes that: Do not have high heels. Have rubber bottoms. Are comfortable and fit you well. Are closed at the toe. Do not wear sandals. If you use a stepladder: Make sure that it is fully opened. Do not climb a closed stepladder. Make sure that both sides of the  stepladder are locked into place. Ask someone to hold it for you, if possible. Clearly mark and make sure that you can see: Any grab bars or handrails. First and last steps. Where the edge of each step is. Use tools that help you move around (mobility aids) if they are needed. These include: Canes. Walkers. Scooters. Crutches. Turn on the lights when you go into a dark area. Replace any light bulbs as soon as they burn out. Set up your furniture so you have a clear path. Avoid moving your furniture around. If any of your floors are uneven, fix them. If there are any pets around you, be aware of where they are. Review your medicines with your doctor. Some medicines can make you feel dizzy. This can increase your chance of falling. Ask your doctor what other things that you can do to help prevent falls. This information is not intended to replace advice given to you by your health care provider. Make sure you discuss any questions you have with your health care provider. Document Released: 08/21/2009 Document Revised: 04/01/2016 Document Reviewed: 11/29/2014 Elsevier Interactive Patient Education  2017 [employer].

## 2022-04-14 ENCOUNTER — Ambulatory Visit: Payer: Medicare HMO

## 2022-04-16 DIAGNOSIS — R972 Elevated prostate specific antigen [PSA]: Secondary | ICD-10-CM | POA: Diagnosis not present

## 2022-04-16 DIAGNOSIS — N401 Enlarged prostate with lower urinary tract symptoms: Secondary | ICD-10-CM | POA: Diagnosis not present

## 2022-04-16 DIAGNOSIS — R351 Nocturia: Secondary | ICD-10-CM | POA: Diagnosis not present

## 2022-04-29 ENCOUNTER — Other Ambulatory Visit: Payer: Self-pay | Admitting: Adult Health

## 2022-04-29 ENCOUNTER — Encounter: Payer: Self-pay | Admitting: Adult Health

## 2022-04-29 ENCOUNTER — Ambulatory Visit (INDEPENDENT_AMBULATORY_CARE_PROVIDER_SITE_OTHER): Payer: Medicare HMO | Admitting: Adult Health

## 2022-04-29 VITALS — BP 126/84 | HR 53 | Temp 97.9°F | Ht 68.0 in | Wt 200.0 lb

## 2022-04-29 DIAGNOSIS — E785 Hyperlipidemia, unspecified: Secondary | ICD-10-CM | POA: Diagnosis not present

## 2022-04-29 DIAGNOSIS — I10 Essential (primary) hypertension: Secondary | ICD-10-CM | POA: Diagnosis not present

## 2022-04-29 DIAGNOSIS — Z23 Encounter for immunization: Secondary | ICD-10-CM | POA: Diagnosis not present

## 2022-04-29 DIAGNOSIS — Z0000 Encounter for general adult medical examination without abnormal findings: Secondary | ICD-10-CM

## 2022-04-29 DIAGNOSIS — N400 Enlarged prostate without lower urinary tract symptoms: Secondary | ICD-10-CM

## 2022-04-29 DIAGNOSIS — Z9861 Coronary angioplasty status: Secondary | ICD-10-CM | POA: Diagnosis not present

## 2022-04-29 DIAGNOSIS — I2510 Atherosclerotic heart disease of native coronary artery without angina pectoris: Secondary | ICD-10-CM

## 2022-04-29 LAB — CBC WITH DIFFERENTIAL/PLATELET
Basophils Absolute: 0 10*3/uL (ref 0.0–0.1)
Basophils Relative: 0.4 % (ref 0.0–3.0)
Eosinophils Absolute: 0.2 10*3/uL (ref 0.0–0.7)
Eosinophils Relative: 3.8 % (ref 0.0–5.0)
HCT: 46.3 % (ref 39.0–52.0)
Hemoglobin: 15.3 g/dL (ref 13.0–17.0)
Lymphocytes Relative: 22.9 % (ref 12.0–46.0)
Lymphs Abs: 1.4 10*3/uL (ref 0.7–4.0)
MCHC: 33.1 g/dL (ref 30.0–36.0)
MCV: 89.8 fl (ref 78.0–100.0)
Monocytes Absolute: 0.6 10*3/uL (ref 0.1–1.0)
Monocytes Relative: 9.7 % (ref 3.0–12.0)
Neutro Abs: 3.8 10*3/uL (ref 1.4–7.7)
Neutrophils Relative %: 63.2 % (ref 43.0–77.0)
Platelets: 176 10*3/uL (ref 150.0–400.0)
RBC: 5.16 Mil/uL (ref 4.22–5.81)
RDW: 13.7 % (ref 11.5–15.5)
WBC: 6.1 10*3/uL (ref 4.0–10.5)

## 2022-04-29 LAB — COMPREHENSIVE METABOLIC PANEL
ALT: 25 U/L (ref 0–53)
AST: 28 U/L (ref 0–37)
Albumin: 4 g/dL (ref 3.5–5.2)
Alkaline Phosphatase: 68 U/L (ref 39–117)
BUN: 14 mg/dL (ref 6–23)
CO2: 31 mEq/L (ref 19–32)
Calcium: 9.3 mg/dL (ref 8.4–10.5)
Chloride: 108 mEq/L (ref 96–112)
Creatinine, Ser: 1.05 mg/dL (ref 0.40–1.50)
GFR: 71.62 mL/min (ref >60.00)
Glucose, Bld: 84 mg/dL (ref 70–99)
Potassium: 4.3 mEq/L (ref 3.5–5.1)
Sodium: 144 mEq/L (ref 135–145)
Total Bilirubin: 1.2 mg/dL (ref 0.2–1.2)
Total Protein: 7.2 g/dL (ref 6.0–8.3)

## 2022-04-29 LAB — TSH: TSH: 2.36 u[IU]/mL (ref 0.35–5.50)

## 2022-04-29 MED ORDER — IBUPROFEN 800 MG PO TABS: 800.0000 mg | ORAL_TABLET | Freq: Three times a day (TID) | ORAL | 0 refills | Status: AC | PRN

## 2022-04-29 NOTE — Patient Instructions (Addendum)
It was great seeing you today   We will follow up with you regarding your lab work   Please let me know if you need anything   

## 2022-04-29 NOTE — Progress Notes (Signed)
Subjective:    Patient ID: Edward Freeman, male    DOB: 26-Jul-1951, 71 y.o.   MRN: [id number]  HPI Patient presents for yearly preventative medicine examination. He is a pleasant 71 year old male who  has a past medical history of CAD (coronary artery disease), GERD (gastroesophageal reflux disease), Hyperlipidemia, Hypertension, Migraines, Nodule of left lung, PSVT (paroxysmal supraventricular tachycardia) ([place]), Sinus bradycardia, and Syncope and collapse.  Essential Hypertension -initial with Norvasc 5 mg daily.  He denies dizziness, lightheadedness, or chest pain BP Readings from Last 3 Encounters:  04/29/22 126/84  04/13/22 124/70  12/23/21 120/78   Hyperlipidemia -currently managed by cardiology with Lipitor 80 mg, Zetia 10 mg  and aspirin 81 mg daily.  He denies myalgia or fatigue  Lab Results  Component Value Date   CHOL 128 12/23/2021   HDL 65 12/23/2021   LDLCALC 53 12/23/2021   TRIG 38 12/23/2021   CHOLHDL 2.0 12/23/2021    CAD-status post percutaneous coronary angioplasty.  He has a drug-eluting stent for an occluded dominant RCA in November 2019.  He did have some recurrent symptoms similar to preintervention's and a repeat catheterization was performed on 12/11/2018 which revealed a widely patent RCA stent with no other disease.  He is on Plavix and aspirin.  Currently denies chest pain but does continue to have intermittent episodes of shortness of breath with exertion that has been ongoing for years.  BPH -managed by urology with Flomax 0.4 mg daily and proscar 5 mg daily.   He does report being asymptomatic   All immunizations and health maintenance protocols were reviewed with the patient and needed orders were placed.  Appropriate screening laboratory values were ordered for the patient including screening of hyperlipidemia, renal function and hepatic function. If indicated by BPH, a PSA was ordered.  Medication reconciliation,  past medical history, social  history, problem list and allergies were reviewed in detail with the patient  Goals were established with regard to weight loss, exercise, and  diet in compliance with medications Wt Readings from Last 3 Encounters:  04/29/22 200 lb (90.7 kg)  04/13/22 202 lb 14.4 oz (92 kg)  12/23/21 206 lb (93.4 kg)   Review of Systems  Constitutional: Negative.   HENT: Negative.    Eyes: Negative.   Respiratory: Negative.    Cardiovascular: Negative.   Gastrointestinal: Negative.   Endocrine: Negative.   Genitourinary: Negative.   Musculoskeletal: Negative.   Skin: Negative.   Allergic/Immunologic: Negative.   Neurological: Negative.   Hematological: Negative.   Psychiatric/Behavioral: Negative.    All other systems reviewed and are negative.  Past Medical History:  Diagnosis Date   CAD (coronary artery disease)    a. NSTEMI 09/2018 s/p DES to RCA, EF 55-60%.   GERD (gastroesophageal reflux disease)    Hyperlipidemia    Hypertension    Migraines    Nodule of left lung    PSVT (paroxysmal supraventricular tachycardia) ([facility])    Sinus bradycardia    Syncope and collapse     Social History   Socioeconomic History   Marital status: Married    Spouse name: Not on file   Number of children: Not on file   Years of education: Not on file   Highest education level: Not on file  Occupational History   Not on file  Tobacco Use   Smoking status: Never   Smokeless tobacco: Never  Vaping Use   Vaping Use: Never used  Substance and Sexual Activity  Alcohol use: No    Comment: 1-2 beers a month   Drug use: No   Sexual activity: Not on file  Other Topics Concern   Not on file  Social History Narrative   Not on file   Social Determinants of Health   Financial Resource Strain: Low Risk  (04/13/2022)   Overall Financial Resource Strain (CARDIA)    Difficulty of Paying Living Expenses: Not hard at all  Food Insecurity: No Food Insecurity (04/13/2022)   Hunger Vital Sign    Worried  About Running Out of Food in the Last Year: Never true    Ran Out of Food in the Last Year: Never true  Transportation Needs: No Transportation Needs (04/13/2022)   PRAPARE - Hydrologist (Medical): No    Lack of Transportation (Non-Medical): No  Physical Activity: Inactive (04/13/2022)   Exercise Vital Sign    Days of Exercise per Week: 0 days    Minutes of Exercise per Session: 0 min  Stress: No Stress Concern Present (04/13/2022)   [place]    Feeling of Stress : Not at all  Social Connections: Moderately Isolated (04/01/2021)   Social Connection and Isolation Panel [NHANES]    Frequency of Communication with Friends and Family: Three times a week    Frequency of Social Gatherings with Friends and Family: Three times a week    Attends Religious Services: Never    Active Member of Clubs or Organizations: No    Attends Archivist Meetings: Never    Marital Status: Married  Human resources officer Violence: Not At Risk (04/01/2021)   Humiliation, Afraid, Rape, and Kick questionnaire    Fear of Current or Ex-Partner: No    Emotionally Abused: No    Physically Abused: No    Sexually Abused: No    Past Surgical History:  Procedure Laterality Date   CORONARY STENT INTERVENTION N/A 09/25/2018   Procedure: CORONARY STENT INTERVENTION;  Surgeon: Jettie Booze, MD;  Location: [facility];  Service: Cardiovascular;  Laterality: N/A;   LEFT HEART CATH AND CORONARY ANGIOGRAPHY N/A 09/25/2018   Procedure: LEFT HEART CATH AND CORONARY ANGIOGRAPHY;  Surgeon: Jettie Booze, MD;  Location: [facility];  Service: Cardiovascular;  Laterality: N/A;   LEFT HEART CATH AND CORONARY ANGIOGRAPHY N/A 12/11/2018   Procedure: LEFT HEART CATH AND CORONARY ANGIOGRAPHY;  Surgeon: Lorretta Harp, MD;  Location: [facility];  Service: Cardiovascular;  Laterality: N/A;    Family  History  Problem Relation Age of Onset   Hypertension Mother    Stroke Mother    Alcohol abuse Father    Diabetes Sister    Diabetes Brother    Diabetes Sister    Colon cancer Neg Hx    Esophageal cancer Neg Hx    Rectal cancer Neg Hx    Stomach cancer Neg Hx     No Known Allergies  Current Outpatient Medications on File Prior to Visit  Medication Sig Dispense Refill   amLODipine (NORVASC) 5 MG tablet Take 1 tablet (5 mg total) by mouth daily. 90 tablet 3   aspirin 81 MG tablet Take 1 tablet (81 mg total) by mouth daily. (Patient taking differently: Take 81 mg by mouth daily after lunch.) 30 tablet 11   atorvastatin (LIPITOR) 80 MG tablet Take 1 tablet by mouth once daily 90 tablet 3   ezetimibe (ZETIA) 10 MG tablet Take 1  tablet (10 mg total) by mouth daily. 90 tablet 3   finasteride (PROSCAR) 5 MG tablet Take 5 mg by mouth daily.     nitroGLYCERIN (NITROSTAT) 0.4 MG SL tablet Place 1 tablet (0.4 mg total) under the tongue every 5 (five) minutes as needed for chest pain (up to 3 doses. If taking 3rd dose call 911). 25 tablet 3   tamsulosin (FLOMAX) 0.4 MG CAPS capsule Take 1 capsule by mouth daily.     No current facility-administered medications on file prior to visit.    BP 126/84   Pulse (!) 53   Temp 97.9 F (36.6 C) (Oral)   Ht '5\' 8"'$  (1.727 m)   Wt 200 lb (90.7 kg)   SpO2 97%   BMI 30.41 kg/m       Objective:   Physical Exam Vitals and nursing note reviewed.  Constitutional:      General: He is not in acute distress.    Appearance: Normal appearance. He is well-developed and normal weight.  HENT:     Head: Normocephalic and atraumatic.     Right Ear: Tympanic membrane, ear canal and external ear normal. There is no impacted cerumen.     Left Ear: Tympanic membrane, ear canal and external ear normal. There is no impacted cerumen.     Nose: Nose normal. No congestion or rhinorrhea.     Mouth/Throat:     Mouth: Mucous membranes are moist.     Pharynx:  Oropharynx is clear. No oropharyngeal exudate or posterior oropharyngeal erythema.  Eyes:     General:        Right eye: No discharge.        Left eye: No discharge.     Extraocular Movements: Extraocular movements intact.     Conjunctiva/sclera: Conjunctivae normal.     Pupils: Pupils are equal, round, and reactive to light.  Neck:     Vascular: No carotid bruit.     Trachea: No tracheal deviation.  Cardiovascular:     Rate and Rhythm: Normal rate and regular rhythm.     Pulses: Normal pulses.     Heart sounds: Normal heart sounds. No murmur heard.    No friction rub. No gallop.  Pulmonary:     Effort: Pulmonary effort is normal. No respiratory distress.     Breath sounds: Normal breath sounds. No stridor. No wheezing, rhonchi or rales.  Chest:     Chest wall: No tenderness.  Abdominal:     General: Abdomen is flat. Bowel sounds are normal. There is no distension.     Palpations: Abdomen is soft. There is no mass.     Tenderness: There is no abdominal tenderness. There is no right CVA tenderness, left CVA tenderness, guarding or rebound.     Hernia: No hernia is present.  Musculoskeletal:        General: No swelling, tenderness, deformity or signs of injury. Normal range of motion.     Right lower leg: No edema.     Left lower leg: No edema.  Lymphadenopathy:     Cervical: No cervical adenopathy.  Skin:    General: Skin is warm and dry.     Capillary Refill: Capillary refill takes less than 2 seconds.     Coloration: Skin is not jaundiced or pale.     Findings: No bruising, erythema, lesion or rash.  Neurological:     General: No focal deficit present.     Mental Status: He is alert and oriented to  person, place, and time.     Cranial Nerves: No cranial nerve deficit.     Sensory: No sensory deficit.     Motor: No weakness.     Coordination: Coordination normal.     Gait: Gait normal.     Deep Tendon Reflexes: Reflexes normal.  Psychiatric:        Mood and Affect: Mood  normal.        Behavior: Behavior normal.        Thought Content: Thought content normal.        Judgment: Judgment normal.       Assessment & Plan:  1. Routine general medical examination at a health care facility - Follow up in one year or sooner if needed - Get shingles vaccination at pharmacy  - CBC with Differential/Platelet; Future - Comprehensive metabolic panel; Future - TSH; Future  2. Essential hypertension - well controlled. No change in medications  - CBC with Differential/Platelet; Future - Comprehensive metabolic panel; Future - TSH; Future  3. CAD S/P percutaneous coronary angioplasty - Per cardiology  - CBC with Differential/Platelet; Future - Comprehensive metabolic panel; Future - TSH; Future  4. Benign prostatic hyperplasia without lower urinary tract symptoms - Per urology   5. Dyslipidemia, goal LDL below 70 - Cholesterol level normal a few months ago  - No change in medications  - CBC with Differential/Platelet; Future - Comprehensive metabolic panel; Future - TSH; Future  6. Need for pneumococcal vaccination  - Pneumococcal polysaccharide vaccine 23-valent greater than or equal to 2yo subcutaneous/IM  Dorothyann Peng, NP

## 2022-07-27 ENCOUNTER — Encounter: Payer: Self-pay | Admitting: Gastroenterology

## 2022-08-27 DIAGNOSIS — H5213 Myopia, bilateral: Secondary | ICD-10-CM | POA: Diagnosis not present

## 2022-08-27 DIAGNOSIS — H524 Presbyopia: Secondary | ICD-10-CM | POA: Diagnosis not present

## 2022-10-03 NOTE — Progress Notes (Signed)
10/03/2022 Edward Freeman [id number] 1951-01-27   CHIEF COMPLAINT: Schedule a colonoscopy   HISTORY OF PRESENT ILLNESS:  Edward Freeman is a 71 year old male with a past medical history of hypertension, hyperlipidemia, coronary artery disease, S/P NSTEMI and DES to the RCA 09/2018 on ASA, PSVT, thoracic aoritc aneurysm and tubular adenomatous colon polyps.  He underwent a screening colonoscopy by Dr. Loletha Carrow 06/14/2017 which identified two 4 mm tubular adenomatous polyps removed from the rectosigmoid and ascending colon.  He was initially advised to repeat a colonoscopy in 5 years, however, his recall was subsequently extended to 7 years due to the updated colon polyp surveillance guidelines.  He is due for a colonoscopy 06/2024 as previously reviewed by Dr. Loletha Carrow.  A letter was sent to the patient on regarding the change in his colonoscopy recall date to 06/2024.  He denies having any change in bowel pattern.  No rectal bleeding or black stools.  No known family history of colorectal cancer.  He has chronic dyspnea with exertion which she last discussed with his cardiologist Dr. Gwenlyn Found 12/2021 typically occurs when yardwork.    He also reported having mild mid chest discomfort unrelated to eating and typically occurs at rest which lasts for 2 hours.  Denies having any classic heartburn or dysphagia.  He was instructed to contact his cardiologist Dr. Gwenlyn Found to further evaluate his mid chest pain.  Colonoscopy 06/14/2017 by Dr. Loletha Carrow: - Diverticulosis in the entire examined colon. - Two 4 mm polyps at the recto-sigmoid colon and in the descending colon, removed with a cold snare. Resected and retrieved. - The examination was otherwise normal on direct and retroflexion views. - 5 year colonoscopy recall  Surgical [P], descending, sigmoid, polyp (2) - TUBULAR ADENOMA (TWO FRAGMENTS). - NO HIGH GRADE DYSPLASIA OR MALIGNANCY.     Latest Ref Rng & Units 04/29/2022    9:54 AM 01/01/2021    8:31 AM  08/13/2020    8:57 AM  CBC  WBC 4.0 - 10.5 K/uL 6.1  5.8  5.1   Hemoglobin 13.0 - 17.0 g/dL 15.3  15.4  15.3   Hematocrit 39.0 - 52.0 % 46.3  45.0  45.6   Platelets 150.0 - 400.0 K/uL 176.0  152.0  182        Latest Ref Rng & Units 04/29/2022    9:54 AM 12/23/2021    9:55 AM 01/01/2021    8:31 AM  CMP  Glucose 70 - 99 mg/dL 84   88   BUN 6 - 23 mg/dL 14   16   Creatinine 0.40 - 1.50 mg/dL 1.05   1.03   Sodium 135 - 145 mEq/L 144   144   Potassium 3.5 - 5.1 mEq/L 4.3   3.9   Chloride 96 - 112 mEq/L 108   108   CO2 19 - 32 mEq/L 31   27   Calcium 8.4 - 10.5 mg/dL 9.3   9.0   Total Protein 6.0 - 8.3 g/dL 7.2  6.7  6.6   Total Bilirubin 0.2 - 1.2 mg/dL 1.2  1.1  1.5   Alkaline Phos 39 - 117 U/L 68  76  72   AST 0 - 37 U/L '28  30  22   '$ ALT 0 - 53 U/L '25  30  24     '$ ECHO 11/10/2021: IMPRESSIONS Left ventricular ejection fraction, by estimation, is 55 to 60%. The left ventricle has normal function. The left ventricle has no  regional wall motion abnormalities. There is mild left ventricular hypertrophy. Left ventricular diastolic parameters are consistent with Grade I diastolic dysfunction (impaired relaxation). The average left ventricular global longitudinal strain is -17.8 %. The global longitudinal strain is normal. 1. 2. Right ventricular systolic function is normal. The right ventricular size is normal. 3. Left atrial size was moderately dilated. The mitral valve is normal in structure. Mild mitral valve regurgitation. No evidence of mitral stenosis. 4. The aortic valve is tricuspid. Aortic valve regurgitation is trivial. No aortic stenosis is present. Aortic regurgitation PHT measures 764 msec. 5. Aortic dilatation noted. There is mild dilatation of the aortic root, measuring 38 mm. There is mild dilatation of the ascending aorta, measuring 42 mm.   Past Medical History:  Diagnosis Date   CAD (coronary artery disease)    a. NSTEMI 09/2018 s/p DES to RCA, EF 55-60%.    GERD (gastroesophageal reflux disease)    Hyperlipidemia    Hypertension    Migraines    Nodule of left lung    PSVT (paroxysmal supraventricular tachycardia) ([facility])    Sinus bradycardia    Syncope and collapse    Past Surgical History:  Procedure Laterality Date   CORONARY STENT INTERVENTION N/A 09/25/2018   Procedure: CORONARY STENT INTERVENTION;  Surgeon: Jettie Booze, MD;  Location: [facility];  Service: Cardiovascular;  Laterality: N/A;   LEFT HEART CATH AND CORONARY ANGIOGRAPHY N/A 09/25/2018   Procedure: LEFT HEART CATH AND CORONARY ANGIOGRAPHY;  Surgeon: Jettie Booze, MD;  Location: [facility];  Service: Cardiovascular;  Laterality: N/A;   LEFT HEART CATH AND CORONARY ANGIOGRAPHY N/A 12/11/2018   Procedure: LEFT HEART CATH AND CORONARY ANGIOGRAPHY;  Surgeon: Lorretta Harp, MD;  Location: [facility];  Service: Cardiovascular;  Laterality: N/A;   Social History:  He that he has never smoked. He has never used smokeless tobacco. He reports that he does not drink alcohol and does not use drugs.  Family History: family history includes Alcohol abuse in his father; Diabetes in his brother, sister, and sister; Hypertension in his mother; Stroke in his mother.  No Known Allergies    Outpatient Encounter Medications as of 10/04/2022  Medication Sig   amLODipine (NORVASC) 5 MG tablet Take 1 tablet (5 mg total) by mouth daily.   aspirin 81 MG tablet Take 1 tablet (81 mg total) by mouth daily. (Patient taking differently: Take 81 mg by mouth daily after lunch.)   atorvastatin (LIPITOR) 80 MG tablet Take 1 tablet by mouth once daily   ezetimibe (ZETIA) 10 MG tablet Take 1 tablet (10 mg total) by mouth daily.   finasteride (PROSCAR) 5 MG tablet Take 5 mg by mouth daily.   ibuprofen (ADVIL) 800 MG tablet Take 1 tablet (800 mg total) by mouth every 8 (eight) hours as needed.   nitroGLYCERIN (NITROSTAT) 0.4 MG SL tablet Place 1 tablet (0.4 mg total)  under the tongue every 5 (five) minutes as needed for chest pain (up to 3 doses. If taking 3rd dose call 911).   tamsulosin (FLOMAX) 0.4 MG CAPS capsule Take 1 capsule by mouth daily.   No facility-administered encounter medications on file as of 10/04/2022.    REVIEW OF SYSTEMS: See HPI  PHYSICAL EXAM: BP 132/84   Pulse (!) 51   Ht '5\' 8"'$  (1.727 m)   Wt 201 lb 4 oz (91.3 kg)   BMI 30.60 kg/m   General: 70 year old male looks 10 years younger than his  stated age. Head: Normocephalic and atraumatic. Eyes:  Sclerae non-icteric, conjunctive pink. Ears: Normal auditory acuity. Mouth: Dentition intact. No ulcers or lesions.  Neck: Supple, no lymphadenopathy or thyromegaly.  Lungs: Clear bilaterally to auscultation without wheezes, crackles or rhonchi. Heart: Regular rate and rhythm. No murmur, rub or gallop appreciated.  Abdomen: Soft, nontender, non distended. No masses. No hepatosplenomegaly. Normoactive bowel sounds x 4 quadrants.  Rectal: Deferred. Musculoskeletal: Symmetrical with no gross deformities. Skin: Warm and dry. No rash or lesions on visible extremities. Extremities: No edema. Neurological: Alert oriented x 4, no focal deficits.  Psychological:  Alert and cooperative. Normal mood and affect.  ASSESSMENT AND PLAN:  History of two 61m tubular adenomatous colon polyp per colonoscopy in 2018 -Next colonoscopy due 06/2024 previously verified by Dr. DLoletha Carrow-Patient to contact our office if he develops any change in bowel pattern or rectal bleeding prior to his colonoscopy recall date -No charge for today's office visit as patient is not due for a colonoscopy at this time  Coronary artery disease on ASA.  -Patient to follow-up with cardiologist Dr. BGwenlyn Foundregarding episodes of mid chest discomfort which occurs during rest and unrelated to eating. -Consider future upper GI evaluation Dr. BGwenlyn Foundassess his chest pain is noncardiac  Thoracic aortic aneurysm. ECHO 11/10/2021 showed  the ascending aorta measuring 42 mm.      CC:  NDorothyann Peng NP

## 2022-10-04 ENCOUNTER — Ambulatory Visit (INDEPENDENT_AMBULATORY_CARE_PROVIDER_SITE_OTHER): Payer: Medicare HMO | Admitting: Nurse Practitioner

## 2022-10-04 ENCOUNTER — Encounter: Payer: Self-pay | Admitting: Nurse Practitioner

## 2022-10-04 VITALS — BP 132/84 | HR 51 | Ht 68.0 in | Wt 201.2 lb

## 2022-10-04 DIAGNOSIS — Z86010 Personal history of colonic polyps: Secondary | ICD-10-CM

## 2022-10-04 NOTE — Patient Instructions (Addendum)
Please follow up with your cardiologist Dr. Gwenlyn Found regarding your occasional mid chest discomfort.  You are due for a colonoscopy August 2025. Our office will send you a reminder letter a few months before your due date.   Contact our office if you have acid reflux symptoms or if you develop any change in your bowel pattern of rectal bleeding   Thank you for trusting me with your gastrointestinal care!   Carl Best, CRNP

## 2022-10-06 NOTE — Progress Notes (Signed)
I left a detailed message on his cell phone voice mail that Dr. Loletha Carrow further reverified his next colonoscopy is due 06/2024.

## 2022-10-07 ENCOUNTER — Other Ambulatory Visit: Payer: Self-pay | Admitting: Cardiovascular Disease

## 2022-10-13 ENCOUNTER — Ambulatory Visit (HOSPITAL_COMMUNITY): Payer: Medicare HMO | Attending: General Practice

## 2022-10-13 DIAGNOSIS — I5189 Other ill-defined heart diseases: Secondary | ICD-10-CM | POA: Diagnosis not present

## 2022-10-13 DIAGNOSIS — I080 Rheumatic disorders of both mitral and aortic valves: Secondary | ICD-10-CM | POA: Insufficient documentation

## 2022-10-13 LAB — ECHOCARDIOGRAM COMPLETE
AR max vel: 2.86 cm2
AV Area VTI: 2.89 cm2
AV Area mean vel: 2.66 cm2
AV Mean grad: 2 mmHg
AV Peak grad: 4 mmHg
Ao pk vel: 1 m/s
Area-P 1/2: 1.41 cm2
MV M vel: 6.03 m/s
MV Peak grad: 145.4 mmHg
P 1/2 time: 622 msec

## 2022-10-15 DIAGNOSIS — R972 Elevated prostate specific antigen [PSA]: Secondary | ICD-10-CM | POA: Diagnosis not present

## 2022-12-03 ENCOUNTER — Other Ambulatory Visit: Payer: Self-pay | Admitting: Cardiovascular Disease

## 2022-12-24 ENCOUNTER — Ambulatory Visit: Payer: Medicare HMO | Attending: Cardiovascular Disease | Admitting: Cardiovascular Disease

## 2022-12-24 ENCOUNTER — Encounter: Payer: Self-pay | Admitting: Cardiovascular Disease

## 2022-12-24 VITALS — BP 142/84 | HR 45 | Ht 70.0 in | Wt 201.4 lb

## 2022-12-24 DIAGNOSIS — E785 Hyperlipidemia, unspecified: Secondary | ICD-10-CM

## 2022-12-24 DIAGNOSIS — I214 Non-ST elevation (NSTEMI) myocardial infarction: Secondary | ICD-10-CM | POA: Diagnosis not present

## 2022-12-24 DIAGNOSIS — R001 Bradycardia, unspecified: Secondary | ICD-10-CM

## 2022-12-24 DIAGNOSIS — I7121 Aneurysm of the ascending aorta, without rupture: Secondary | ICD-10-CM | POA: Diagnosis not present

## 2022-12-24 DIAGNOSIS — I10 Essential (primary) hypertension: Secondary | ICD-10-CM

## 2022-12-24 NOTE — Assessment & Plan Note (Signed)
Asymptomatic sinus bradycardia with heart rates in the 40s and 50s.  He is not on any negative chronotropic medications.

## 2022-12-24 NOTE — Patient Instructions (Signed)
Medication Instructions:  Your physician recommends that you continue on your current medications as directed. Please refer to the Current Medication list given to you today.  *If you need a refill on your cardiac medications before your next appointment, please call your pharmacy*   Testing/Procedures: Your physician has requested that you have an echocardiogram. Echocardiography is a painless test that uses sound waves to create images of your heart. It provides your doctor with information about the size and shape of your heart and how well your heart's chambers and valves are working. This procedure takes approximately one hour. There are no restrictions for this procedure. Please do NOT wear cologne, perfume, aftershave, or lotions (deodorant is allowed). Please arrive 15 minutes prior to your appointment time. This procedure will be done at [address]. To be done in December.   Follow-Up: At [facility], you and your health needs are our priority.  As part of our continuing mission to provide you with exceptional heart care, we have created designated Provider Care Teams.  These Care Teams include your primary Cardiologist (physician) and Advanced Practice Providers (APPs -  Physician Assistants and Nurse Practitioners) who all work together to provide you with the care you need, when you need it.  We recommend signing up for the patient portal called "MyChart".  Sign up information is provided on this After Visit Summary.  MyChart is used to connect with patients for Virtual Visits (Telemedicine).  Patients are able to view lab/test results, encounter notes, upcoming appointments, etc.  Non-urgent messages can be sent to your provider as well.   To learn more about what you can do with MyChart, go to [email or web address].    Your next appointment:   12 month(s)  Provider:   Quay Burow, MD

## 2022-12-24 NOTE — Assessment & Plan Note (Signed)
History of dyslipidemia on statin therapy with lipid profile performed 12/23/2021 revealing total cholesterol 128, LDL 53 and HDL 65.

## 2022-12-24 NOTE — Assessment & Plan Note (Signed)
History of CAD status post non-STEMI 09/24/2018 status post cardiac catheterization by Dr. Irish Lack revealing occluded dominant RCA with left-to-right collaterals.  He was stented with a 3.5 mm x 20 mm long Synergy drug-eluting stent with an excellent result.  Because of shortness of breath his Brilinta was changed to clopidogrel.  He remains on aspirin today denies chest pain or shortness of breath.

## 2022-12-24 NOTE — Assessment & Plan Note (Signed)
History of essential hypertension with blood pressure measured today of 176/83.  He did not take his antihypertensive medications this morning.

## 2022-12-24 NOTE — Assessment & Plan Note (Signed)
Asymptomatic small ascending thoracic aortic aneurysm by 2D echo performed 10/13/2022 measuring 43 mm.  This will be repeated on an annual basis.

## 2022-12-24 NOTE — Progress Notes (Signed)
12/24/2022 Edward Freeman   11/13/1950  ET:[id number]  Primary Physician Carlisle Cater, Tommi Rumps, NP Primary Cardiologist: Lorretta Harp MD [name], [place]  HPI:  Edward Freeman is a 72 y.o.  married African American male father of 2, grandfather to 2 grandchildren he works doing Furniture conservator/restorer. I last saw him in the office 12/23/2021. I performed cardiac catheterization on him 11/04/03 which was completely normal. His cardiac risk factors include hypertension as well as family history with a mother who died of a myocardial infarction at age 55. He denies chest pain or shortness of breath.  He also had a small apical nodule by CT scan several years ago with recommendations of followup which was never pursued. I've offered him follow-up CT scans which she has declined because of fiscal constraints.   He presented on 09/25/2018 with a non-STEMI.  His troponin rose to 15.  He underwent cardiac catheterization by Dr. Irish Lack 09/25/2018 demonstrating an occluded dominant RCA with left-to-right collaterals.  He was stented with a synergy 3.5 mm x 20 mm long drug-eluting stent with excellent result.    He was on aspirin and Brilinta however because of shortness of breath Brilinta this was transitioned to Plavix.  Because of recurrent symptoms similar to his preintervention symptoms I performed cardiac catheterization on him 12/11/2018 in the right radial approach revealing a widely patent RCA stent with no other significant disease.   Since I saw him a year ago he has complained of some dyspnea on exertion but denies chest pain.  He did have a normal 2D echo and negative Myoview 08/26/2020 and again a normal 2D echo 11/10/2021.  He enjoys fishing and doing yard work.  His most recent 2D echo performed 10/13/2022 revealed normal LV systolic function without valvular abnormalities.  His ascending thoracic aorta was stable at 43 mm.   Current Meds  Medication Sig   amLODipine (NORVASC) 5 MG tablet  Take 1 tablet by mouth once daily   aspirin 81 MG tablet Take 1 tablet (81 mg total) by mouth daily. (Patient taking differently: Take 81 mg by mouth daily after lunch.)   atorvastatin (LIPITOR) 80 MG tablet Take 1 tablet by mouth once daily   ezetimibe (ZETIA) 10 MG tablet Take 1 tablet (10 mg total) by mouth daily.   finasteride (PROSCAR) 5 MG tablet Take 5 mg by mouth daily.   ibuprofen (ADVIL) 800 MG tablet Take 1 tablet (800 mg total) by mouth every 8 (eight) hours as needed.   nitroGLYCERIN (NITROSTAT) 0.4 MG SL tablet Place 1 tablet (0.4 mg total) under the tongue every 5 (five) minutes as needed for chest pain (up to 3 doses. If taking 3rd dose call 911).   tamsulosin (FLOMAX) 0.4 MG CAPS capsule Take 1 capsule by mouth daily.     No Known Allergies  Social History   Socioeconomic History   Marital status: Married    Spouse name: Not on file   Number of children: Not on file   Years of education: Not on file   Highest education level: Not on file  Occupational History   Not on file  Tobacco Use   Smoking status: Never   Smokeless tobacco: Never  Vaping Use   Vaping Use: Never used  Substance and Sexual Activity   Alcohol use: No    Comment: 1-2 beers a month   Drug use: No   Sexual activity: Not on file  Other Topics Concern   Not  on file  Social History Narrative   Not on file   Social Determinants of Health   Financial Resource Strain: Low Risk  (04/13/2022)   Overall Financial Resource Strain (CARDIA)    Difficulty of Paying Living Expenses: Not hard at all  Food Insecurity: No Food Insecurity (04/13/2022)   Hunger Vital Sign    Worried About Running Out of Food in the Last Year: Never true    Ran Out of Food in the Last Year: Never true  Transportation Needs: No Transportation Needs (04/13/2022)   PRAPARE - Hydrologist (Medical): No    Lack of Transportation (Non-Medical): No  Physical Activity: Inactive (04/13/2022)   Exercise Vital  Sign    Days of Exercise per Week: 0 days    Minutes of Exercise per Session: 0 min  Stress: No Stress Concern Present (04/13/2022)   [place]    Feeling of Stress : Not at all  Social Connections: Moderately Isolated (04/01/2021)   Social Connection and Isolation Panel [NHANES]    Frequency of Communication with Friends and Family: Three times a week    Frequency of Social Gatherings with Friends and Family: Three times a week    Attends Religious Services: Never    Active Member of Clubs or Organizations: No    Attends Archivist Meetings: Never    Marital Status: Married  Human resources officer Violence: Not At Risk (04/01/2021)   Humiliation, Afraid, Rape, and Kick questionnaire    Fear of Current or Ex-Partner: No    Emotionally Abused: No    Physically Abused: No    Sexually Abused: No     Review of Systems: General: negative for chills, fever, night sweats or weight changes.  Cardiovascular: negative for chest pain, dyspnea on exertion, edema, orthopnea, palpitations, paroxysmal nocturnal dyspnea or shortness of breath Dermatological: negative for rash Respiratory: negative for cough or wheezing Urologic: negative for hematuria Abdominal: negative for nausea, vomiting, diarrhea, bright red blood per rectum, melena, or hematemesis Neurologic: negative for visual changes, syncope, or dizziness All other systems reviewed and are otherwise negative except as noted above.    Blood pressure (!) 176/83, pulse (!) 45, height 5' 10"$  (1.778 m), weight 201 lb 6.4 oz (91.4 kg), SpO2 99 %.  General appearance: alert and no distress Neck: no adenopathy, no carotid bruit, no JVD, supple, symmetrical, trachea midline, and thyroid not enlarged, symmetric, no tenderness/mass/nodules Lungs: clear to auscultation bilaterally Heart: regular rate and rhythm, S1, S2 normal, no murmur, click, rub or gallop Extremities:  extremities normal, atraumatic, no cyanosis or edema Pulses: 2+ and symmetric Skin: Skin color, texture, turgor normal. No rashes or lesions Neurologic: Grossly normal  EKG sinus bradycardia at 45 without ST or T wave changes.  There was early R wave transition.  I personally reviewed this EKG.  ASSESSMENT AND PLAN:   Essential hypertension History of essential hypertension with blood pressure measured today of 176/83.  He did not take his antihypertensive medications this morning.  Dyslipidemia, goal LDL below 70 History of dyslipidemia on statin therapy with lipid profile performed 12/23/2021 revealing total cholesterol 128, LDL 53 and HDL 65.  NSTEMI (non-ST elevated myocardial infarction) [facility]) History of CAD status post non-STEMI 09/24/2018 status post cardiac catheterization by Dr. Irish Lack revealing occluded dominant RCA with left-to-right collaterals.  He was stented with a 3.5 mm x 20 mm long Synergy drug-eluting stent with an excellent result.  Because of  shortness of breath his Brilinta was changed to clopidogrel.  He remains on aspirin today denies chest pain or shortness of breath.  Sinus bradycardia Asymptomatic sinus bradycardia with heart rates in the 40s and 50s.  He is not on any negative chronotropic medications.  Thoracic aortic aneurysm Asymptomatic small ascending thoracic aortic aneurysm by 2D echo performed 10/13/2022 measuring 43 mm.  This will be repeated on an annual basis.     Lorretta Harp MD FACP,FACC,FAHA, [facility] 12/24/2022 11:07 AM

## 2023-01-01 ENCOUNTER — Other Ambulatory Visit: Payer: Self-pay | Admitting: Cardiovascular Disease

## 2023-01-27 DIAGNOSIS — R972 Elevated prostate specific antigen [PSA]: Secondary | ICD-10-CM | POA: Diagnosis not present

## 2023-02-28 ENCOUNTER — Other Ambulatory Visit: Payer: Self-pay | Admitting: Surgery

## 2023-02-28 DIAGNOSIS — K4090 Unilateral inguinal hernia, without obstruction or gangrene, not specified as recurrent: Secondary | ICD-10-CM | POA: Diagnosis not present

## 2023-03-16 ENCOUNTER — Telehealth: Payer: Self-pay

## 2023-03-16 NOTE — Telephone Encounter (Signed)
   Name: Edward Freeman  DOB: 1951-03-13  MRN: [id number]  Primary Cardiologist: Nanetta Batty, MD   Preoperative team, please contact this patient and set up a phone call appointment for further preoperative risk assessment. Please obtain consent and complete medication review. Thank you for your help.  I confirm that guidance regarding antiplatelet and oral anticoagulation therapy has been completed and, if necessary, noted below.  Per office protocol, he may hold aspirin for 5-7 days prior to procedure and should resume as soon as hemodynamically stable postoperatively.    Carlos Levering, NP 03/16/2023, 5:24 PM [facility]

## 2023-03-16 NOTE — Telephone Encounter (Signed)
   Pre-operative Risk Assessment    Patient Name: Edward Freeman  DOB: 09-14-1951 MRN: [id number]      Request for Surgical Clearance    Procedure:   Hernia Repair Surgery  Date of Surgery:  Clearance TBD                                 Surgeon:  Dr. Christian Mate Group or Practice Name:  [facility]  Phone number:  [phone] Fax number:  [phone]   Type of Clearance Requested:   - Medical  - Pharmacy:  Hold Aspirin     Type of Anesthesia:   LMA and TAP Block   Additional requests/questions:    Deforest Hoyles   03/16/2023, 5:00 PM

## 2023-03-17 ENCOUNTER — Telehealth: Payer: Self-pay | Admitting: *Deleted

## 2023-03-17 NOTE — Telephone Encounter (Signed)
Will route back to requesting surgeons office to make them aware. 

## 2023-03-17 NOTE — Telephone Encounter (Signed)
  Patient Consent for Virtual Visit        Edward Freeman has provided verbal consent on 03/17/2023 for a virtual visit (video or telephone).   CONSENT FOR VIRTUAL VISIT FOR:  Edward Freeman  By participating in this virtual visit I agree to the following:  I hereby voluntarily request, consent and authorize [facility] and its employed or contracted physicians, physician assistants, nurse practitioners or other licensed health care professionals (the Practitioner), to provide me with telemedicine health care services (the "Services") as deemed necessary by the treating Practitioner. I acknowledge and consent to receive the Services by the Practitioner via telemedicine. I understand that the telemedicine visit will involve communicating with the Practitioner through live audiovisual communication technology and the disclosure of certain medical information by electronic transmission. I acknowledge that I have been given the opportunity to request an in-person assessment or other available alternative prior to the telemedicine visit and am voluntarily participating in the telemedicine visit.  I understand that I have the right to withhold or withdraw my consent to the use of telemedicine in the Freeman of my care at any time, without affecting my right to future care or treatment, and that the Practitioner or I may terminate the telemedicine visit at any time. I understand that I have the right to inspect all information obtained and/or recorded in the Freeman of the telemedicine visit and may receive copies of available information for a reasonable fee.  I understand that some of the potential risks of receiving the Services via telemedicine include:  Delay or interruption in medical evaluation due to technological equipment failure or disruption; Information transmitted may not be sufficient (e.g. poor resolution of images) to allow for appropriate medical decision making by the Practitioner;  and/or  In rare instances, security protocols could fail, causing a breach of personal health information.  Furthermore, I acknowledge that it is my responsibility to provide information about my medical history, conditions and care that is complete and accurate to the best of my ability. I acknowledge that Practitioner's advice, recommendations, and/or decision may be based on factors not within their control, such as incomplete or inaccurate data provided by me or distortions of diagnostic images or specimens that may result from electronic transmissions. I understand that the practice of medicine is not an exact science and that Practitioner makes no warranties or guarantees regarding treatment outcomes. I acknowledge that a copy of this consent can be made available to me via my patient portal [facility] MyChart), or I can request a printed copy by calling the office of  [facility].    I understand that my insurance will be billed for this visit.   I have read or had this consent read to me. I understand the contents of this consent, which adequately explains the benefits and risks of the Services being provided via telemedicine.  I have been provided ample opportunity to ask questions regarding this consent and the Services and have had my questions answered to my satisfaction. I give my informed consent for the services to be provided through the use of telemedicine in my medical care

## 2023-03-17 NOTE — Telephone Encounter (Signed)
Spoke with patient and scheduled him for a telehealth preop clearance appt for 03/22/23 at 10:20 AM.

## 2023-03-22 ENCOUNTER — Ambulatory Visit: Payer: Medicare HMO | Attending: Cardiology

## 2023-03-22 DIAGNOSIS — Z01810 Encounter for preprocedural cardiovascular examination: Secondary | ICD-10-CM | POA: Diagnosis not present

## 2023-03-22 NOTE — Progress Notes (Signed)
Virtual Visit via Telephone Note   Because of Edward Freeman's co-morbid illnesses, he is at least at moderate risk for complications without adequate follow up.  This format is felt to be most appropriate for this patient at this time.  The patient did not have access to video technology/had technical difficulties with video requiring transitioning to audio format only (telephone).  All issues noted in this document were discussed and addressed.  No physical exam could be performed with this format.  Please refer to the patient's chart for his consent to telehealth for [facility].  Evaluation Performed:  Preoperative cardiovascular risk assessment _____________   Date:  03/22/2023   Patient ID:  Edward Freeman Sep 14, 1951, MRN [id number] Patient Location:  Home Provider location:   Office  Primary Care Provider:  Shirline Frees, NP Primary Cardiologist:  Nanetta Batty, MD  Chief Complaint / Patient Profile   72 y.o. y/o male with a h/o CAD status post PCI to RCA back in 2019, hyperlipidemia, hypertension, thoracic aortic aneurysm who is pending hernia repair and presents today for telephonic preoperative cardiovascular risk assessment.  History of Present Illness    Edward Freeman is a 72 y.o. male who presents via audio/video conferencing for a telehealth visit today.  Pt was last seen in cardiology clinic on 12/24/2022 by Dr. Gery Pray.  At that time PAITYN PHIMMASONE was doing well other than some chronic dyspnea.  The patient is now pending procedure as outlined above. Since his last visit, he states that he occasionally has some fatigue but this is nothing new.  He has some shortness of breath but again this is chronic.  He does work part-time and has a International aid/development worker.  Requires some walking and lifting up to 10 pounds.  He usually works for about 3 hours a day.  He also enjoys fishing.  He is scored a 5.62 METS on the DASI.  This exceeds the 4 METS minimum  requirement.  Fatigue is the same as it was back then. He has had SOB in the last few weeks no chest pain. He does part time work and that requires some walking and lifting 10  lbs, cleaning 3 hours a day. He enjoys fishing.   Per office protocol, he may hold aspirin for 5-7 days prior to procedure and should resume as soon as hemodynamically stable postoperatively. He is not taking plavix.   Past Medical History    Past Medical History:  Diagnosis Date   CAD (coronary artery disease)    a. NSTEMI 09/2018 s/p DES to RCA, EF 55-60%.   GERD (gastroesophageal reflux disease)    Hyperlipidemia    Hypertension    Migraines    Nodule of left lung    PSVT (paroxysmal supraventricular tachycardia)    Sinus bradycardia    Syncope and collapse    Past Surgical History:  Procedure Laterality Date   CORONARY STENT INTERVENTION N/A 09/25/2018   Procedure: CORONARY STENT INTERVENTION;  Surgeon: Corky Crafts, MD;  Location: [facility] INVASIVE CV LAB;  Service: Cardiovascular;  Laterality: N/A;   LEFT HEART CATH AND CORONARY ANGIOGRAPHY N/A 09/25/2018   Procedure: LEFT HEART CATH AND CORONARY ANGIOGRAPHY;  Surgeon: Corky Crafts, MD;  Location: [facility] INVASIVE CV LAB;  Service: Cardiovascular;  Laterality: N/A;   LEFT HEART CATH AND CORONARY ANGIOGRAPHY N/A 12/11/2018   Procedure: LEFT HEART CATH AND CORONARY ANGIOGRAPHY;  Surgeon: Runell Gess, MD;  Location: [facility] INVASIVE CV LAB;  Service:  Cardiovascular;  Laterality: N/A;    Allergies  No Known Allergies  Home Medications    Prior to Admission medications   Medication Sig Start Date End Date Taking? Authorizing Provider  amLODipine (NORVASC) 5 MG tablet Take 1 tablet by mouth once daily 12/03/22   Runell Gess, MD  aspirin 81 MG tablet Take 1 tablet (81 mg total) by mouth daily. Patient taking differently: Take 81 mg by mouth daily after lunch. 09/26/18   Laurann Montana, PA-C  atorvastatin (LIPITOR) 80 MG tablet Take 1 tablet by  mouth once daily 01/03/23   Runell Gess, MD  ezetimibe (ZETIA) 10 MG tablet Take 1 tablet (10 mg total) by mouth daily. 09/21/21 12/24/22  Runell Gess, MD  finasteride (PROSCAR) 5 MG tablet Take 5 mg by mouth daily. 03/15/22   [provider]  ibuprofen (ADVIL) 800 MG tablet Take 1 tablet (800 mg total) by mouth every 8 (eight) hours as needed. 04/29/22   Nafziger, Kandee Keen, NP  nitroGLYCERIN (NITROSTAT) 0.4 MG SL tablet Place 1 tablet (0.4 mg total) under the tongue every 5 (five) minutes as needed for chest pain (up to 3 doses. If taking 3rd dose call 911). 12/26/20   Corrin Parker, PA-C  tamsulosin (FLOMAX) 0.4 MG CAPS capsule Take 1 capsule by mouth daily. 06/25/20   [provider]    Physical Exam    Vital Signs:  [name] does not have vital signs available for review today.  Given telephonic nature of communication, physical exam is limited. AAOx3. NAD. Normal affect.  Speech and respirations are unlabored.  Accessory Clinical Findings    None  Assessment & Plan    1.  Preoperative Cardiovascular Risk Assessment:  [name] perioperative risk of a major cardiac event is 6.6% according to the Revised Cardiac Risk Index (RCRI).  Therefore, he is at high risk for perioperative complications.   His functional capacity is good at 5.62 METs according to the [place] Activity Status Index (DASI). Recommendations: According to ACC/AHA guidelines, no further cardiovascular testing needed.  The patient may proceed to surgery at acceptable risk.   Antiplatelet and/or Anticoagulation Recommendations: Aspirin can be held for 5-7 days prior to his surgery.  Please resume Aspirin post operatively when it is felt to be safe from a bleeding standpoint.   The patient was advised that if he develops new symptoms prior to surgery to contact our office to arrange for a follow-up visit, and he verbalized understanding.   A copy of this note will be routed to requesting  surgeon.  Time:   Today, I have spent 8 minutes with the patient with telehealth technology discussing medical history, symptoms, and management plan.     Sharlene Dory, PA-C  03/22/2023, 10:24 AM

## 2023-03-26 ENCOUNTER — Other Ambulatory Visit: Payer: Self-pay | Admitting: Cardiovascular Disease

## 2023-04-22 ENCOUNTER — Encounter (HOSPITAL_BASED_OUTPATIENT_CLINIC_OR_DEPARTMENT_OTHER): Payer: Self-pay | Admitting: Surgery

## 2023-04-22 ENCOUNTER — Other Ambulatory Visit: Payer: Self-pay

## 2023-04-25 MED ORDER — ENSURE PRE-SURGERY PO LIQD: 296.0000 mL | Freq: Once | ORAL | Status: DC

## 2023-04-25 MED ORDER — CHLORHEXIDINE GLUCONATE CLOTH 2 % EX PADS: 6.0000 | MEDICATED_PAD | Freq: Once | CUTANEOUS | Status: DC

## 2023-04-25 NOTE — Progress Notes (Signed)

## 2023-04-27 ENCOUNTER — Ambulatory Visit (INDEPENDENT_AMBULATORY_CARE_PROVIDER_SITE_OTHER): Payer: Medicare HMO

## 2023-04-27 VITALS — BP 118/62 | HR 60 | Temp 98.4°F | Ht 70.0 in | Wt 199.9 lb

## 2023-04-27 DIAGNOSIS — R972 Elevated prostate specific antigen [PSA]: Secondary | ICD-10-CM | POA: Diagnosis not present

## 2023-04-27 DIAGNOSIS — Z0000 Encounter for general adult medical examination without abnormal findings: Secondary | ICD-10-CM | POA: Diagnosis not present

## 2023-04-27 DIAGNOSIS — Z1211 Encounter for screening for malignant neoplasm of colon: Secondary | ICD-10-CM | POA: Diagnosis not present

## 2023-04-27 NOTE — Patient Instructions (Addendum)
[name] , Thank you for taking time to come for your Medicare Wellness Visit. I appreciate your ongoing commitment to your health goals. Please review the following plan we discussed and let me know if I can assist you in the future.   These are the goals we discussed:  Goals       Increase physical activity (pt-stated)      Stay Healthy      Patient Stated      More exercise       Patient Stated      04/13/2022, no goals        This is a list of the screening recommended for you and due dates:  Health Maintenance  Topic Date Due   DTaP/Tdap/Td vaccine (1 - Tdap) Never done   COVID-19 Vaccine (4 - 2023-24 season) 05/13/2023*   Zoster (Shingles) Vaccine (1 of 2) 07/28/2023*   Colon Cancer Screening  04/26/2024*   Flu Shot  06/09/2023   Medicare Annual Wellness Visit  04/26/2024   Pneumonia Vaccine  Completed   Hepatitis C Screening  Completed   HPV Vaccine  Aged Out  *Topic was postponed. The date shown is not the original due date.    Advanced directives: Advance directive discussed with you today. Even though you declined this today, please call our office should you change your mind, and we can give you the proper paperwork for you to fill out.   Conditions/risks identified: None  Next appointment: Follow up in one year for your annual wellness visit.   Preventive Care 73 Years and Older, Male  Preventive care refers to lifestyle choices and visits with your health care provider that can promote health and wellness. What does preventive care include? A yearly physical exam. This is also called an annual well check. Dental exams once or twice a year. Routine eye exams. Ask your health care provider how often you should have your eyes checked. Personal lifestyle choices, including: Daily care of your teeth and gums. Regular physical activity. Eating a healthy diet. Avoiding tobacco and drug use. Limiting alcohol use. Practicing safe sex. Taking low doses of  aspirin every day. Taking vitamin and mineral supplements as recommended by your health care provider. What happens during an annual well check? The services and screenings done by your health care provider during your annual well check will depend on your age, overall health, lifestyle risk factors, and family history of disease. Counseling  Your health care provider may ask you questions about your: Alcohol use. Tobacco use. Drug use. Emotional well-being. Home and relationship well-being. Sexual activity. Eating habits. History of falls. Memory and ability to understand (cognition). Work and work Astronomer. Screening  You may have the following tests or measurements: Height, weight, and BMI. Blood pressure. Lipid and cholesterol levels. These may be checked every 5 years, or more frequently if you are over 15 years old. Skin check. Lung cancer screening. You may have this screening every year starting at age 17 if you have a 30-pack-year history of smoking and currently smoke or have quit within the past 15 years. Fecal occult blood test (FOBT) of the stool. You may have this test every year starting at age 43. Flexible sigmoidoscopy or colonoscopy. You may have a sigmoidoscopy every 5 years or a colonoscopy every 10 years starting at age 22. Prostate cancer screening. Recommendations will vary depending on your family history and other risks. Hepatitis C blood test. Hepatitis B blood test. Sexually transmitted disease (STD)  testing. Diabetes screening. This is done by checking your blood sugar (glucose) after you have not eaten for a while (fasting). You may have this done every 1-3 years. Abdominal aortic aneurysm (AAA) screening. You may need this if you are a current or former smoker. Osteoporosis. You may be screened starting at age 46 if you are at high risk. Talk with your health care provider about your test results, treatment options, and if necessary, the need for more  tests. Vaccines  Your health care provider may recommend certain vaccines, such as: Influenza vaccine. This is recommended every year. Tetanus, diphtheria, and acellular pertussis (Tdap, Td) vaccine. You may need a Td booster every 10 years. Zoster vaccine. You may need this after age 3. Pneumococcal 13-valent conjugate (PCV13) vaccine. One dose is recommended after age 79. Pneumococcal polysaccharide (PPSV23) vaccine. One dose is recommended after age 75. Talk to your health care provider about which screenings and vaccines you need and how often you need them. This information is not intended to replace advice given to you by your health care provider. Make sure you discuss any questions you have with your health care provider. Document Released: 11/21/2015 Document Revised: 07/14/2016 Document Reviewed: 08/26/2015 Elsevier Interactive Patient Education  2017 [place] Prevention in the Home Falls can cause injuries. They can happen to people of all ages. There are many things you can do to make your home safe and to help prevent falls. What can I do on the outside of my home? Regularly fix the edges of walkways and driveways and fix any cracks. Remove anything that might make you trip as you walk through a door, such as a raised step or threshold. Trim any bushes or trees on the path to your home. Use bright outdoor lighting. Clear any walking paths of anything that might make someone trip, such as rocks or tools. Regularly check to see if handrails are loose or broken. Make sure that both sides of any steps have handrails. Any raised decks and porches should have guardrails on the edges. Have any leaves, snow, or ice cleared regularly. Use sand or salt on walking paths during winter. Clean up any spills in your garage right away. This includes oil or grease spills. What can I do in the bathroom? Use night lights. Install grab bars by the toilet and in the tub and shower.  Do not use towel bars as grab bars. Use non-skid mats or decals in the tub or shower. If you need to sit down in the shower, use a plastic, non-slip stool. Keep the floor dry. Clean up any water that spills on the floor as soon as it happens. Remove soap buildup in the tub or shower regularly. Attach bath mats securely with double-sided non-slip rug tape. Do not have throw rugs and other things on the floor that can make you trip. What can I do in the bedroom? Use night lights. Make sure that you have a light by your bed that is easy to reach. Do not use any sheets or blankets that are too big for your bed. They should not hang down onto the floor. Have a firm chair that has side arms. You can use this for support while you get dressed. Do not have throw rugs and other things on the floor that can make you trip. What can I do in the kitchen? Clean up any spills right away. Avoid walking on wet floors. Keep items that you use a lot in  easy-to-reach places. If you need to reach something above you, use a strong step stool that has a grab bar. Keep electrical cords out of the way. Do not use floor polish or wax that makes floors slippery. If you must use wax, use non-skid floor wax. Do not have throw rugs and other things on the floor that can make you trip. What can I do with my stairs? Do not leave any items on the stairs. Make sure that there are handrails on both sides of the stairs and use them. Fix handrails that are broken or loose. Make sure that handrails are as long as the stairways. Check any carpeting to make sure that it is firmly attached to the stairs. Fix any carpet that is loose or worn. Avoid having throw rugs at the top or bottom of the stairs. If you do have throw rugs, attach them to the floor with carpet tape. Make sure that you have a light switch at the top of the stairs and the bottom of the stairs. If you do not have them, ask someone to add them for you. What else  can I do to help prevent falls? Wear shoes that: Do not have high heels. Have rubber bottoms. Are comfortable and fit you well. Are closed at the toe. Do not wear sandals. If you use a stepladder: Make sure that it is fully opened. Do not climb a closed stepladder. Make sure that both sides of the stepladder are locked into place. Ask someone to hold it for you, if possible. Clearly mark and make sure that you can see: Any grab bars or handrails. First and last steps. Where the edge of each step is. Use tools that help you move around (mobility aids) if they are needed. These include: Canes. Walkers. Scooters. Crutches. Turn on the lights when you go into a dark area. Replace any light bulbs as soon as they burn out. Set up your furniture so you have a clear path. Avoid moving your furniture around. If any of your floors are uneven, fix them. If there are any pets around you, be aware of where they are. Review your medicines with your doctor. Some medicines can make you feel dizzy. This can increase your chance of falling. Ask your doctor what other things that you can do to help prevent falls. This information is not intended to replace advice given to you by your health care provider. Make sure you discuss any questions you have with your health care provider. Document Released: 08/21/2009 Document Revised: 04/01/2016 Document Reviewed: 11/29/2014 Elsevier Interactive Patient Education  2017 [employer].

## 2023-04-27 NOTE — Progress Notes (Signed)
Subjective:   Edward Freeman is a 72 y.o. male who presents for Medicare Annual/Subsequent preventive examination.  Visit Complete: In person  Patient Medicare AWV questionnaire was completed by the patient on ; I have confirmed that all information answered by patient is correct and no changes since this date.  Review of Systems     Cardiac Risk Factors include: advanced age (>49men, >21 women);male gender;dyslipidemia;hypertension     Objective:    Today's Vitals   04/27/23 0810  BP: 118/62  Pulse: 60  Temp: 98.4 F (36.9 C)  TempSrc: Oral  SpO2: 96%  Weight: 199 lb 14.4 oz (90.7 kg)  Height: 5\' 10"  (1.778 m)   Body mass index is 28.68 kg/m.     04/27/2023    8:29 AM 04/13/2022    9:18 AM 04/01/2021    8:31 AM 01/09/2019    8:55 AM 12/11/2018   10:50 AM 09/24/2018    5:58 PM 09/24/2018   12:37 PM  Advanced Directives  Does Patient Have a Medical Advance Directive? No No No No No No No  Would patient like information on creating a medical advance directive? No - Patient declined Yes (MAU/Ambulatory/Procedural Areas - Information given) Yes (MAU/Ambulatory/Procedural Areas - Information given) No - Patient declined No - Patient declined No - Patient declined No - Patient declined    Current Medications (verified) Outpatient Encounter Medications as of 04/27/2023  Medication Sig   amLODipine (NORVASC) 5 MG tablet Take 1 tablet by mouth once daily   aspirin 81 MG tablet Take 1 tablet (81 mg total) by mouth daily. (Patient taking differently: Take 81 mg by mouth daily after lunch.)   atorvastatin (LIPITOR) 80 MG tablet Take 1 tablet by mouth once daily   ezetimibe (ZETIA) 10 MG tablet Take 1 tablet (10 mg total) by mouth daily.   finasteride (PROSCAR) 5 MG tablet Take 5 mg by mouth daily.   ibuprofen (ADVIL) 800 MG tablet Take 1 tablet (800 mg total) by mouth every 8 (eight) hours as needed.   nitroGLYCERIN (NITROSTAT) 0.4 MG SL tablet Place 1 tablet (0.4 mg total) under  the tongue every 5 (five) minutes as needed for chest pain (up to 3 doses. If taking 3rd dose call 911).   tamsulosin (FLOMAX) 0.4 MG CAPS capsule Take 1 capsule by mouth daily.   Facility-Administered Encounter Medications as of 04/27/2023  Medication   Chlorhexidine Gluconate Cloth 2 % PADS 6 each   And   Chlorhexidine Gluconate Cloth 2 % PADS 6 each   feeding supplement (ENSURE PRE-SURGERY) liquid 296 mL    Allergies (verified) Patient has no known allergies.   History: Past Medical History:  Diagnosis Date   CAD (coronary artery disease)    a. NSTEMI 09/2018 s/p DES to RCA, EF 55-60%.   GERD (gastroesophageal reflux disease)    Hyperlipidemia    Hypertension    Migraines    Nodule of left lung    PSVT (paroxysmal supraventricular tachycardia)    Sinus bradycardia    Syncope and collapse    Past Surgical History:  Procedure Laterality Date   CORONARY STENT INTERVENTION N/A 09/25/2018   Procedure: CORONARY STENT INTERVENTION;  Surgeon: Corky Crafts, MD;  Location: [facility] INVASIVE CV LAB;  Service: Cardiovascular;  Laterality: N/A;   LEFT HEART CATH AND CORONARY ANGIOGRAPHY N/A 09/25/2018   Procedure: LEFT HEART CATH AND CORONARY ANGIOGRAPHY;  Surgeon: Corky Crafts, MD;  Location: [facility] INVASIVE CV LAB;  Service: Cardiovascular;  Laterality: N/A;  LEFT HEART CATH AND CORONARY ANGIOGRAPHY N/A 12/11/2018   Procedure: LEFT HEART CATH AND CORONARY ANGIOGRAPHY;  Surgeon: Runell Gess, MD;  Location: [facility] INVASIVE CV LAB;  Service: Cardiovascular;  Laterality: N/A;   Family History  Problem Relation Age of Onset   Hypertension Mother    Stroke Mother    Alcohol abuse Father    Diabetes Sister    Diabetes Brother    Diabetes Sister    Colon cancer Neg Hx    Esophageal cancer Neg Hx    Rectal cancer Neg Hx    Stomach cancer Neg Hx    Social History   Socioeconomic History   Marital status: Married    Spouse name: Not on file   Number of children: Not on file    Years of education: Not on file   Highest education level: Not on file  Occupational History   Not on file  Tobacco Use   Smoking status: Never   Smokeless tobacco: Never  Vaping Use   Vaping Use: Never used  Substance and Sexual Activity   Alcohol use: Yes    Comment: 1-2 beers a month   Drug use: No   Sexual activity: Not on file  Other Topics Concern   Not on file  Social History Narrative   Not on file   Social Determinants of Health   Financial Resource Strain: Low Risk  (04/27/2023)   Overall Financial Resource Strain (CARDIA)    Difficulty of Paying Living Expenses: Not hard at all  Food Insecurity: No Food Insecurity (04/27/2023)   Hunger Vital Sign    Worried About Running Out of Food in the Last Year: Never true    Ran Out of Food in the Last Year: Never true  Transportation Needs: No Transportation Needs (04/27/2023)   PRAPARE - Administrator, Civil Service (Medical): No    Lack of Transportation (Non-Medical): No  Physical Activity: Insufficiently Active (04/27/2023)   Exercise Vital Sign    Days of Exercise per Week: 2 days    Minutes of Exercise per Session: 30 min  Stress: No Stress Concern Present (04/27/2023)   [employer] of Occupational Health - Occupational Stress Questionnaire    Feeling of Stress : Not at all  Social Connections: Socially Integrated (04/27/2023)   Social Connection and Isolation Panel [NHANES]    Frequency of Communication with Friends and Family: More than three times a week    Frequency of Social Gatherings with Friends and Family: More than three times a week    Attends Religious Services: More than 4 times per year    Active Member of [employer] or Organizations: Yes    Attends Engineer, structural: More than 4 times per year    Marital Status: Married    Tobacco Counseling Counseling given: Not Answered   Clinical Intake:  Pre-visit preparation completed: No  Pain : No/denies pain     BMI -  recorded: 28.68 Nutritional Status: BMI 25 -29 Overweight Nutritional Risks: None Diabetes: No  How often do you need to have someone help you when you read instructions, pamphlets, or other written materials from your doctor or pharmacy?: 1 - Never  Interpreter Needed?: No  Information entered by :: Theresa Mulligan Lpn   Activities of Daily Living    04/27/2023    8:28 AM  In your present state of health, do you have any difficulty performing the following activities:  Hearing? 0  Vision? 0  Difficulty  concentrating or making decisions? 0  Walking or climbing stairs? 0  Dressing or bathing? 0  Doing errands, shopping? 0  Preparing Food and eating ? N  Using the Toilet? N  In the past six months, have you accidently leaked urine? N  Do you have problems with loss of bowel control? N  Managing your Medications? N  Managing your Finances? N  Housekeeping or managing your Housekeeping? N    Patient Care Team: Shirline Frees, NP as PCP - General (Family Medicine) Runell Gess, MD as PCP - Cardiology (Cardiology) Andrena Mews, DO as Consulting Physician (Sports Medicine) Rene Paci, MD as Consulting Physician (Urology)  Indicate any recent Medical Services you may have received from other than [facility] providers in the past year (date may be approximate).     Assessment:   This is a routine wellness examination for Gaspar Garbe.  Hearing/Vision screen Hearing Screening - Comments:: Denies hearing difficulties   Vision Screening - Comments:: Wears rx glasses - up to date with routine [facility] exams with  [facility]  Dietary issues and exercise activities discussed:     Goals Addressed               This Visit's Progress     Increase physical activity (pt-stated)        Stay Healthy       Depression Screen    04/27/2023    8:15 AM 04/13/2022    9:19 AM 04/01/2021    8:30 AM 01/01/2021    7:56 AM 01/01/2020    8:05 AM  PHQ 2/9 Scores  PHQ - 2  Score 0 0 0 0 0  PHQ- 9 Score     0    Fall Risk    04/27/2023    8:28 AM 04/13/2022    9:19 AM 04/01/2021    8:32 AM 01/01/2021    7:56 AM 01/01/2020    8:05 AM  Fall Risk   Falls in the past year? 0 0 0 0 0  Number falls in past yr: 0 0 0  0  Injury with Fall? 0 0 0  0  Risk for fall due to : No Fall Risks Medication side effect Impaired vision    Follow up Falls prevention discussed Falls evaluation completed;Education provided;Falls prevention discussed Falls prevention discussed      MEDICARE RISK AT HOME:  Medicare Risk at Home - 04/27/23 0833     Any stairs in or around the home? Yes    If so, are there any without handrails? Yes    Home free of loose throw rugs in walkways, pet beds, electrical cords, etc? Yes    Adequate lighting in your home to reduce risk of falls? Yes    Life alert? No    Use of a cane, walker or w/c? No    Grab bars in the bathroom? No    Shower chair or bench in shower? No    Elevated toilet seat or a handicapped toilet? No             TIMED UP AND GO:  Was the test performed?  Yes  Length of time to ambulate 10 feet: 10 sec Gait steady and fast without use of assistive device    Cognitive Function:        04/27/2023    8:32 AM 04/27/2023    8:29 AM 04/13/2022    9:20 AM 04/01/2021    8:35 AM  6CIT Screen  What Year? 0 points 0 points 0 points 0 points  What month? 0 points 0 points 0 points 0 points  What time? 0 points 0 points 0 points 0 points  Count back from 20 0 points 0 points 0 points 0 points  Months in reverse 4 points 0 points 4 points 0 points  Repeat phrase 0 points 0 points 6 points 10 points  Total Score 4 points 0 points 10 points 10 points    Immunizations Immunization History  Administered Date(s) Administered   Fluad Quad(high Dose 65+) 01/01/2021   PFIZER(Purple Top)SARS-COV-2 Vaccination 12/15/2019, 01/05/2020, 10/28/2020   Pneumococcal Conjugate-13 05/30/2017   Pneumococcal Polysaccharide-23 04/29/2022     TDAP status: Due, Education has been provided regarding the importance of this vaccine. Advised may receive this vaccine at local pharmacy or Health Dept. Aware to provide a copy of the vaccination record if obtained from local pharmacy or Health Dept. Verbalized acceptance and understanding.    Pneumococcal vaccine status: Up to date  Covid-19 vaccine status: Completed vaccines  Qualifies for Shingles Vaccine? Yes   Zostavax completed No   Shingrix Completed?: No.    Education has been provided regarding the importance of this vaccine. Patient has been advised to call insurance company to determine out of pocket expense if they have not yet received this vaccine. Advised may also receive vaccine at local pharmacy or Health Dept. Verbalized acceptance and understanding.  Screening Tests Health Maintenance  Topic Date Due   DTaP/Tdap/Td (1 - Tdap) Never done   COVID-19 Vaccine (4 - 2023-24 season) 05/13/2023 (Originally 07/09/2022)   Zoster Vaccines- Shingrix (1 of 2) 07/28/2023 (Originally 03/23/2001)   Colonoscopy  04/26/2024 (Originally 06/14/2022)   INFLUENZA VACCINE  06/09/2023   Medicare Annual Wellness (AWV)  04/26/2024   Pneumonia Vaccine 82+ Years old  Completed   Hepatitis C Screening  Completed   HPV VACCINES  Aged Out    Health Maintenance  Health Maintenance Due  Topic Date Due   DTaP/Tdap/Td (1 - Tdap) Never done    Colorectal cancer screening: Referral to GI placed 04/27/23. Pt aware the office will call re: appt.  Lung Cancer Screening: (Low Dose CT Chest recommended if Age 82-80 years, 20 pack-year currently smoking OR have quit w/in 15years.) does not qualify.     Additional Screening:  Hepatitis C Screening: does qualify; Completed 01/01/21  Vision Screening: Recommended annual ophthalmology exams for early detection of glaucoma and other disorders of the eye. Is the patient up to date with their annual eye exam?  Yes  Who is the provider or what is the  name of the office in which the patient attends annual eye exams? [facility] If pt is not established with a provider, would they like to be referred to a provider to establish care? No .   Dental Screening: Recommended annual dental exams for proper oral hygiene    Community Resource Referral / Chronic Care Management:  CRR required this visit?  No   CCM required this visit?  No     Plan:     I have personally reviewed and noted the following in the patient's chart:   Medical and social history Use of alcohol, tobacco or illicit drugs  Current medications and supplements including opioid prescriptions. Patient is not currently taking opioid prescriptions. Functional ability and status Nutritional status Physical activity Advanced directives List of other physicians Hospitalizations, surgeries, and ER visits in previous 12 months Vitals Screenings to include cognitive, depression, and  falls Referrals and appointments  In addition, I have reviewed and discussed with patient certain preventive protocols, quality metrics, and best practice recommendations. A written personalized care plan for preventive services as well as general preventive health recommendations were provided to patient.     Tillie Rung, LPN   1/61/0960   After Visit Summary: Given  Nurse Notes: None

## 2023-05-01 NOTE — H&P (Signed)
REFERRING PHYSICIAN: Shelly Rubenstein* PROVIDER: Wayne Both, MD MRN: [id number] DOB: 1951-05-24  Subjective   Chief Complaint: New Consultation (Left inguinal hernia)  History of Present Illness: Edward Freeman is a 72 y.o. male who is seen as an office consultation for evaluation of New Consultation (Left inguinal hernia)  This is a 72 year old gentleman who is referred here for evaluation of a symptomatic left inguinal hernia. He has had the hernia for at least 6 months. It is slowly getting larger and causing some discomfort but no obstructive symptoms. He has not noticed any issues on his right side. He is otherwise without complaints. He had a previous MI in 2019 and is followed closely by cardiology and is now only on aspirin  Review of Systems: A complete review of systems was obtained from the patient. I have reviewed this information and discussed as appropriate with the patient. See HPI as well for other ROS.  ROS   Medical History: Past Medical History:  Diagnosis Date  Arthritis  CHF (congestive heart failure) (CMS/HHS-HCC)  Hyperlipidemia  Hypertension   There is no problem list on file for this patient.  Past Surgical History:  Procedure Laterality Date  HEART STENT SURGERY 2022    No Known Allergies  Current Outpatient Medications on File Prior to Visit  Medication Sig Dispense Refill  amLODIPine (NORVASC) 5 MG tablet Take 5 mg by mouth once daily  aspirin 81 MG EC tablet Take 81 mg by mouth once daily  atorvastatin (LIPITOR) 80 MG tablet Take 1 tablet by mouth once daily  ezetimibe (ZETIA) 10 mg tablet Take 1 tablet by mouth once daily  finasteride (PROSCAR) 5 mg tablet Take 5 mg by mouth once daily  nitroGLYcerin (NITROSTAT) 0.4 MG SL tablet Place under the tongue  tamsulosin (FLOMAX) 0.4 mg capsule Take 1 capsule by mouth once daily   No current facility-administered medications on file prior to visit.   History reviewed. No pertinent  family history.   Social History   Tobacco Use  Smoking Status Never  Smokeless Tobacco Never    Social History   Socioeconomic History  Marital status: Married  Tobacco Use  Smoking status: Never  Smokeless tobacco: Never  Substance and Sexual Activity  Alcohol use: Never  Drug use: Never   Social Determinants of Health   Financial Resource Strain: Low Risk (04/13/2022)  Received from [facility]  Overall Financial Resource Strain (CARDIA)  Difficulty of Paying Living Expenses: Not hard at all  Food Insecurity: No Food Insecurity (04/13/2022)  Received from [facility]  Hunger Vital Sign  Worried About Running Out of Food in the Last Year: Never true  Ran Out of Food in the Last Year: Never true  Transportation Needs: No Transportation Needs (04/13/2022)  Received from [facility] - [facility]  Lack of Transportation (Medical): No  Lack of Transportation (Non-Medical): No  Physical Activity: Inactive (04/13/2022)  Received from [facility]  Exercise Vital Sign  Days of Exercise per Week: 0 days  Minutes of Exercise per Session: 0 min  Stress: No Stress Concern Present (04/13/2022)  Received from [facility] - Occupational Stress Questionnaire  Feeling of Stress : Not at all  Social Connections: Moderately Isolated (04/01/2021)  Received from [facility]  Social Connection and Isolation Panel [NHANES]  Frequency of Communication with Friends and Family: Three times a week  Frequency of Social Gatherings with Friends and Family: Three times a week  Attends Religious  Services: Never  Database administrator or Organizations: No  Attends Engineer, structural: Never  Marital Status: Married   Objective:   Vitals:   BP: (!) 169/91  Pulse: (!) 49  Temp: 36.7 C (98.1 F)  SpO2: 99%  Weight: 90.4 kg (199 lb 6.4 oz)  Height: 177.8 cm (5\' 10" )  PainSc: 3  PainLoc: Abdomen   Body mass index is 28.61  kg/m.  Physical Exam   He appears well on exam  He has a large, reducible left inguinal hernia with no evidence of right inguinal hernia or umbilical hernia  Labs, Imaging and Diagnostic Testing: I have reviewed his notes in the electronic medical records  Assessment and Plan:   Diagnoses and all orders for this visit:  Left inguinal hernia   At this point I had a long discussion with the patient regarding abdominal wall anatomy and hernias. We discussed conservative management of hernias versus hernia surgery. His is quite large and slightly difficult to reduce so I have recommended repair with mesh. We discussed both the laparoscopic and open techniques. After discussion we will proceed with an open and left inguinal hernia repair with mesh. I explained the surgical procedure in detail. We discussed the risks which includes but is not limited to bleeding, infection, injury to surrounding structures, the need to use mesh, nerve entrapment, chronic pain, hernia recurrence, cardiopulmonary issues with surgery and anesthesia, postoperative recovery, etc. He understands and wishes to proceed with surgery which will be scheduled

## 2023-05-02 ENCOUNTER — Ambulatory Visit (HOSPITAL_BASED_OUTPATIENT_CLINIC_OR_DEPARTMENT_OTHER): Payer: Medicare HMO | Admitting: Anesthesiology

## 2023-05-02 ENCOUNTER — Encounter (HOSPITAL_BASED_OUTPATIENT_CLINIC_OR_DEPARTMENT_OTHER)
Admission: RE | Disposition: A | Discharge status: Home / Self Care | Payer: Self-pay | Source: Home / Self Care | Attending: Surgery

## 2023-05-02 ENCOUNTER — Ambulatory Visit (HOSPITAL_BASED_OUTPATIENT_CLINIC_OR_DEPARTMENT_OTHER)
Admission: RE | Admit: 2023-05-02 | Discharge: 2023-05-02 | Disposition: A | Discharge status: Home / Self Care | Payer: Medicare HMO | Attending: Surgery | Admitting: Surgery

## 2023-05-02 ENCOUNTER — Other Ambulatory Visit: Payer: Self-pay

## 2023-05-02 ENCOUNTER — Encounter (HOSPITAL_BASED_OUTPATIENT_CLINIC_OR_DEPARTMENT_OTHER): Payer: Self-pay | Admitting: Surgery

## 2023-05-02 DIAGNOSIS — I10 Essential (primary) hypertension: Secondary | ICD-10-CM | POA: Diagnosis not present

## 2023-05-02 DIAGNOSIS — G8918 Other acute postprocedural pain: Secondary | ICD-10-CM | POA: Diagnosis not present

## 2023-05-02 DIAGNOSIS — I471 Supraventricular tachycardia, unspecified: Secondary | ICD-10-CM | POA: Diagnosis not present

## 2023-05-02 DIAGNOSIS — I110 Hypertensive heart disease with heart failure: Secondary | ICD-10-CM | POA: Insufficient documentation

## 2023-05-02 DIAGNOSIS — I2510 Atherosclerotic heart disease of native coronary artery without angina pectoris: Secondary | ICD-10-CM | POA: Insufficient documentation

## 2023-05-02 DIAGNOSIS — I509 Heart failure, unspecified: Secondary | ICD-10-CM | POA: Diagnosis not present

## 2023-05-02 DIAGNOSIS — Z01818 Encounter for other preprocedural examination: Secondary | ICD-10-CM

## 2023-05-02 DIAGNOSIS — E785 Hyperlipidemia, unspecified: Secondary | ICD-10-CM | POA: Diagnosis not present

## 2023-05-02 DIAGNOSIS — K4090 Unilateral inguinal hernia, without obstruction or gangrene, not specified as recurrent: Secondary | ICD-10-CM | POA: Diagnosis not present

## 2023-05-02 DIAGNOSIS — I252 Old myocardial infarction: Secondary | ICD-10-CM | POA: Insufficient documentation

## 2023-05-02 DIAGNOSIS — I25119 Atherosclerotic heart disease of native coronary artery with unspecified angina pectoris: Secondary | ICD-10-CM | POA: Diagnosis not present

## 2023-05-02 HISTORY — PX: INSERTION OF MESH: SHX5868

## 2023-05-02 HISTORY — PX: INGUINAL HERNIA REPAIR: SHX194

## 2023-05-02 SURGERY — REPAIR, HERNIA, INGUINAL, ADULT
Anesthesia: Regional | Site: Groin | Laterality: Left

## 2023-05-02 MED ORDER — DEXAMETHASONE SODIUM PHOSPHATE 10 MG/ML IJ SOLN
INTRAMUSCULAR | Status: AC
  Filled 2023-05-02: qty 1

## 2023-05-02 MED ORDER — CEFAZOLIN SODIUM-DEXTROSE 2-4 GM/100ML-% IV SOLN
2.0000 g | INTRAVENOUS | Status: AC
  Administered 2023-05-02: 2 g via INTRAVENOUS

## 2023-05-02 MED ORDER — BUPIVACAINE-EPINEPHRINE (PF) 0.25% -1:200000 IJ SOLN
INTRAMUSCULAR | Status: DC | PRN
  Administered 2023-05-02: 10 mL

## 2023-05-02 MED ORDER — ONDANSETRON HCL 4 MG/2ML IJ SOLN
INTRAMUSCULAR | Status: DC | PRN
  Administered 2023-05-02: 4 mg via INTRAVENOUS

## 2023-05-02 MED ORDER — FENTANYL CITRATE (PF) 100 MCG/2ML IJ SOLN: 25.0000 ug | INTRAMUSCULAR | Status: DC | PRN

## 2023-05-02 MED ORDER — LIDOCAINE 2% (20 MG/ML) 5 ML SYRINGE
INTRAMUSCULAR | Status: AC
  Filled 2023-05-02: qty 5

## 2023-05-02 MED ORDER — PROPOFOL 10 MG/ML IV BOLUS
INTRAVENOUS | Status: DC | PRN
  Administered 2023-05-02: 150 mg via INTRAVENOUS

## 2023-05-02 MED ORDER — FENTANYL CITRATE (PF) 100 MCG/2ML IJ SOLN
INTRAMUSCULAR | Status: AC
  Filled 2023-05-02: qty 2

## 2023-05-02 MED ORDER — FENTANYL CITRATE (PF) 100 MCG/2ML IJ SOLN
100.0000 ug | Freq: Once | INTRAMUSCULAR | Status: AC
  Administered 2023-05-02: 100 ug via INTRAVENOUS

## 2023-05-02 MED ORDER — ROPIVACAINE HCL 5 MG/ML IJ SOLN
INTRAMUSCULAR | Status: DC | PRN
  Administered 2023-05-02: 30 mL via PERINEURAL

## 2023-05-02 MED ORDER — ARTIFICIAL TEARS OPHTHALMIC OINT
TOPICAL_OINTMENT | OPHTHALMIC | Status: AC
  Filled 2023-05-02: qty 3.5

## 2023-05-02 MED ORDER — CEFAZOLIN SODIUM-DEXTROSE 2-4 GM/100ML-% IV SOLN
INTRAVENOUS | Status: AC
  Filled 2023-05-02: qty 100

## 2023-05-02 MED ORDER — ACETAMINOPHEN 500 MG PO TABS: 1000.0000 mg | ORAL_TABLET | Freq: Once | ORAL | Status: DC

## 2023-05-02 MED ORDER — DEXAMETHASONE SODIUM PHOSPHATE 10 MG/ML IJ SOLN
INTRAMUSCULAR | Status: DC | PRN
  Administered 2023-05-02: 10 mg

## 2023-05-02 MED ORDER — MIDAZOLAM HCL 2 MG/2ML IJ SOLN
INTRAMUSCULAR | Status: AC
  Filled 2023-05-02: qty 2

## 2023-05-02 MED ORDER — ACETAMINOPHEN 500 MG PO TABS
1000.0000 mg | ORAL_TABLET | ORAL | Status: AC
  Administered 2023-05-02: 1000 mg via ORAL

## 2023-05-02 MED ORDER — ONDANSETRON HCL 4 MG/2ML IJ SOLN
INTRAMUSCULAR | Status: AC
  Filled 2023-05-02: qty 2

## 2023-05-02 MED ORDER — DEXAMETHASONE SODIUM PHOSPHATE 10 MG/ML IJ SOLN
INTRAMUSCULAR | Status: DC | PRN
  Administered 2023-05-02: 4 mg via INTRAVENOUS

## 2023-05-02 MED ORDER — LACTATED RINGERS IV SOLN: INTRAVENOUS | Status: DC

## 2023-05-02 MED ORDER — TRAMADOL HCL 50 MG PO TABS: 50.0000 mg | ORAL_TABLET | Freq: Four times a day (QID) | ORAL | 0 refills | Status: AC | PRN

## 2023-05-02 MED ORDER — FENTANYL CITRATE (PF) 250 MCG/5ML IJ SOLN
INTRAMUSCULAR | Status: DC | PRN
  Administered 2023-05-02 (×2): 25 ug via INTRAVENOUS
  Administered 2023-05-02: 50 ug via INTRAVENOUS

## 2023-05-02 MED ORDER — PROPOFOL 10 MG/ML IV BOLUS
INTRAVENOUS | Status: AC
  Filled 2023-05-02: qty 20

## 2023-05-02 MED ORDER — ACETAMINOPHEN 500 MG PO TABS
ORAL_TABLET | ORAL | Status: AC
  Filled 2023-05-02: qty 2

## 2023-05-02 SURGICAL SUPPLY — 43 items
ADH SKN CLS APL DERMABOND .7 (GAUZE/BANDAGES/DRESSINGS) ×1
ADH SKNCLS APL OCTYL .7 VIOL (GAUZE/BANDAGES/DRESSINGS) ×1
APL PRP STRL LF DISP 70% ISPRP (MISCELLANEOUS) ×1
BLADE CLIPPER SURG (BLADE) ×1 IMPLANT
BLADE SURG 15 STRL LF DISP TIS (BLADE) ×1 IMPLANT
BLADE SURG 15 STRL SS (BLADE) ×1
CANISTER SUCT 1200ML W/VALVE (MISCELLANEOUS) IMPLANT
CHLORAPREP W/TINT 26 (MISCELLANEOUS) ×1 IMPLANT
COVER BACK TABLE 60X90IN (DRAPES) ×1 IMPLANT
COVER MAYO STAND STRL (DRAPES) ×1 IMPLANT
DERMABOND ADVANCED .7 DNX12 (GAUZE/BANDAGES/DRESSINGS) ×1 IMPLANT
DRAIN PENROSE .5X12 LATEX STL (DRAIN) ×1 IMPLANT
DRAPE LAPAROTOMY 100X72 PEDS (DRAPES) ×1 IMPLANT
DRAPE UTILITY XL STRL (DRAPES) ×1 IMPLANT
ELECT REM PT RETURN 9FT ADLT (ELECTROSURGICAL) ×1
ELECTRODE REM PT RTRN 9FT ADLT (ELECTROSURGICAL) ×1 IMPLANT
GLOVE BIO SURGEON STRL SZ 6.5 (GLOVE) IMPLANT
GLOVE SURG SIGNA 7.5 PF LTX (GLOVE) ×1 IMPLANT
GOWN STRL REUS W/ TWL LRG LVL3 (GOWN DISPOSABLE) ×1 IMPLANT
GOWN STRL REUS W/ TWL XL LVL3 (GOWN DISPOSABLE) ×1 IMPLANT
GOWN STRL REUS W/TWL LRG LVL3 (GOWN DISPOSABLE) ×2
GOWN STRL REUS W/TWL XL LVL3 (GOWN DISPOSABLE) ×1
MESH PARIETEX PROGRIP LEFT (Mesh General) IMPLANT
NDL HYPO 25X1 1.5 SAFETY (NEEDLE) ×1 IMPLANT
NEEDLE HYPO 25X1 1.5 SAFETY (NEEDLE) ×1 IMPLANT
NS IRRIG 1000ML POUR BTL (IV SOLUTION) IMPLANT
PACK BASIN DAY SURGERY FS (CUSTOM PROCEDURE TRAY) ×1 IMPLANT
PENCIL SMOKE EVACUATOR (MISCELLANEOUS) ×1 IMPLANT
SLEEVE SCD COMPRESS KNEE MED (STOCKING) ×1 IMPLANT
SPIKE FLUID TRANSFER (MISCELLANEOUS) IMPLANT
SPONGE INTESTINAL PEANUT (DISPOSABLE) IMPLANT
SPONGE T-LAP 4X18 ~~LOC~~+RFID (SPONGE) ×1 IMPLANT
SUT MNCRL AB 4-0 PS2 18 (SUTURE) ×1 IMPLANT
SUT SILK 2 0 SH (SUTURE) IMPLANT
SUT VIC AB 2-0 CT1 27 (SUTURE) ×2
SUT VIC AB 2-0 CT1 TAPERPNT 27 (SUTURE) ×2 IMPLANT
SUT VIC AB 3-0 CT1 27 (SUTURE) ×1
SUT VIC AB 3-0 CT1 27XBRD (SUTURE) ×1 IMPLANT
SYR BULB EAR ULCER 3OZ GRN STR (SYRINGE) IMPLANT
SYR CONTROL 10ML LL (SYRINGE) ×1 IMPLANT
TOWEL GREEN STERILE FF (TOWEL DISPOSABLE) ×1 IMPLANT
TUBE CONNECTING 20X1/4 (TUBING) IMPLANT
YANKAUER SUCT BULB TIP NO VENT (SUCTIONS) IMPLANT

## 2023-05-02 NOTE — Interval H&P Note (Signed)
History and Physical Interval Note:  NO CHANGE IN H AND P  05/02/2023 9:27 AM  [name]  has presented today for surgery, with the diagnosis of LEFT INGUINAL HERNIA.  The various methods of treatment have been discussed with the patient and family. After consideration of risks, benefits and other options for treatment, the patient has consented to  Procedure(s) with comments: OPEN LEFT HERNIA REPAIR INGUINAL WITH MESH (Left) - LMA GEN TAP BLOCK as a surgical intervention.  The patient's history has been reviewed, patient examined, no change in status, stable for surgery.  I have reviewed the patient's chart and labs.  Questions were answered to the patient's satisfaction.     Edward Freeman

## 2023-05-02 NOTE — Discharge Instructions (Addendum)
[facility]  UMBILICAL OR INGUINAL HERNIA REPAIR: POST OP INSTRUCTIONS  Always review your discharge instruction sheet given to you by the facility where your surgery was performed. IF YOU HAVE DISABILITY OR FAMILY LEAVE FORMS, YOU MUST BRING THEM TO THE OFFICE FOR PROCESSING.   DO NOT GIVE THEM TO YOUR DOCTOR.  1. A  prescription for pain medication may be given to you upon discharge.  Take your pain medication as prescribed, if needed.  If narcotic pain medicine is not needed, then you may take acetaminophen (Tylenol) or ibuprofen (Advil) as needed. 2. Take your usually prescribed medications unless otherwise directed. If you need a refill on your pain medication, please contact your pharmacy.  They will contact our office to request authorization. Prescriptions will not be filled after 5 pm or on week-ends. 3. You should follow a light diet the first 24 hours after arrival home, such as soup and crackers, etc.  Be sure to include lots of fluids daily.  Resume your normal diet the day after surgery. 4.Most patients will experience some swelling and bruising around the umbilicus or in the groin and scrotum.  Ice packs and reclining will help.  Swelling and bruising can take several days to resolve.  6. It is common to experience some constipation if taking pain medication after surgery.  Increasing fluid intake and taking a stool softener (such as Colace) will usually help or prevent this problem from occurring.  A mild laxative (Milk of Magnesia or Miralax) should be taken according to package directions if there are no bowel movements after 48 hours. 7. Unless discharge instructions indicate otherwise, you may remove your bandages 24-48 hours after surgery, and you may shower at that time.  You may have steri-strips (small skin tapes) in place directly over the incision.  These strips should be left on the skin for 7-10 days.  If your surgeon used skin glue on the  incision, you may shower in 24 hours.  The glue will flake off over the next 2-3 weeks.  Any sutures or staples will be removed at the office during your follow-up visit. 8. ACTIVITIES:  You may resume regular (light) daily activities beginning the next day--such as daily self-care, walking, climbing stairs--gradually increasing activities as tolerated.  You may have sexual intercourse when it is comfortable.  Refrain from any heavy lifting or straining until approved by your doctor.  a.You may drive when you are no longer taking prescription pain medication, you can comfortably wear a seatbelt, and you can safely maneuver your car and apply brakes. b.RETURN TO WORK:   _____________________________________________  9.You should see your doctor in the office for a follow-up appointment approximately 2-3 weeks after your surgery.  Make sure that you call for this appointment within a day or two after you arrive home to insure a convenient appointment time. 10.OTHER INSTRUCTIONS: _you may shower starting tomorrow Ice pack, tylenol, and ibuprofen also for pain No lifting more than 15 pounds for 4 weeks________________________    _____________________________________  WHEN TO CALL YOUR DOCTOR: Fever over 101.0 Inability to urinate Nausea and/or vomiting Extreme swelling or bruising Continued bleeding from incision. Increased pain, redness, or drainage from the incision  The clinic staff is available to answer your questions during regular business hours.  Please don't hesitate to call and ask to speak to one of the nurses for clinical concerns.  If you have a medical emergency, go to the nearest emergency room or call 911.  A surgeon from [facility] is always on call at the hospital   [address], [address], [place], [place]  [address] ?  [address] [address], [place], [place]   [address] [phone] ? [phone] ? FAX [phone] Web site:  [email or web address]   Post Anesthesia Home Care Instructions  Activity: Get plenty of rest for the remainder of the day. A responsible individual must stay with you for 24 hours following the procedure.  For the next 24 hours, DO NOT: -Drive a car -Advertising copywriter -Drink alcoholic beverages -Take any medication unless instructed by your physician -Make any legal decisions or sign important papers.  Meals: Start with liquid foods such as gelatin or soup. Progress to regular foods as tolerated. Avoid greasy, spicy, heavy foods. If nausea and/or vomiting occur, drink only clear liquids until the nausea and/or vomiting subsides. Call your physician if vomiting continues.  Special Instructions/Symptoms: Your throat may feel dry or sore from the anesthesia or the breathing tube placed in your throat during surgery. If this causes discomfort, gargle with warm salt water. The discomfort should disappear within 24 hours.  If you had a scopolamine patch placed behind your ear for the management of post- operative nausea and/or vomiting:  1. The medication in the patch is effective for 72 hours, after which it should be removed.  Wrap patch in a tissue and discard in the trash. Wash hands thoroughly with soap and water. 2. You may remove the patch earlier than 72 hours if you experience unpleasant side effects which may include dry mouth, dizziness or visual disturbances. 3. Avoid touching the patch. Wash your hands with soap and water after contact with the patch.    May have Tylenol today after 3:20 PM

## 2023-05-02 NOTE — Transfer of Care (Signed)
Immediate Anesthesia Transfer of Care Note  Patient: MORRILL BOMKAMP  Procedure(s) Performed: OPEN LEFT HERNIA REPAIR INGUINAL WITH MESH (Left: Groin) INSERTION OF MESH (Left: Groin)  Patient Location: PACU  Anesthesia Type:General  Level of Consciousness: drowsy  Airway & Oxygen Therapy: Patient Spontanous Breathing and Patient connected to nasal cannula oxygen  Post-op Assessment: Report given to RN and Post -op Vital signs reviewed and stable  Post vital signs: Reviewed and stable  Last Vitals:  Vitals Value Taken Time  BP 152/88   Temp    Pulse 54   Resp 14   SpO2 100     Last Pain:  Vitals:   05/02/23 0916  TempSrc: Oral  PainSc: 0-No pain      Patients Stated Pain Goal: 3 (05/02/23 0916)  Complications: No notable events documented.

## 2023-05-02 NOTE — Anesthesia Procedure Notes (Addendum)
Procedure Name: LMA Insertion Date/Time: 05/02/2023 11:26 AM  Performed by: Demetrio Lapping, CRNAPre-anesthesia Checklist: Patient identified, Emergency Drugs available, Suction available and Patient being monitored Patient Re-evaluated:Patient Re-evaluated prior to induction Oxygen Delivery Method: Circle System Utilized Preoxygenation: Pre-oxygenation with 100% oxygen Induction Type: IV induction Ventilation: Mask ventilation without difficulty LMA: LMA inserted LMA Size: 4.0 Number of attempts: 1 Airway Equipment and Method: Bite block Placement Confirmation: positive ETCO2 Tube secured with: Tape Dental Injury: Teeth and Oropharynx as per pre-operative assessment

## 2023-05-02 NOTE — Anesthesia Procedure Notes (Signed)
Anesthesia Regional Block: Quadratus lumborum   Pre-Anesthetic Checklist: , timeout performed,  Correct Patient, Correct Site, Correct Laterality,  Correct Procedure, Correct Position, site marked,  Risks and benefits discussed,  Pre-op evaluation,  At surgeon's request and post-op pain management  Laterality: Left  Prep: Maximum Sterile Barrier Precautions used, chloraprep       Needles:  Injection technique: Single-shot  Needle Type: Echogenic Stimulator Needle     Needle Length: 9cm  Needle Gauge: 21     Additional Needles:   Procedures:,,,, ultrasound used (permanent image in chart),,    Narrative:  Start time: 05/02/2023 10:24 AM End time: 05/02/2023 10:27 AM Injection made incrementally with aspirations every 5 mL. Anesthesiologist: Elmer Picker, MD

## 2023-05-02 NOTE — Progress Notes (Signed)
Assisted Dr. Woodrum with left, transabdominal plane, ultrasound guided block. Side rails up, monitors on throughout procedure. See vital signs in flow sheet. Tolerated Procedure well. 

## 2023-05-02 NOTE — Anesthesia Postprocedure Evaluation (Signed)
Anesthesia Post Note  Patient: Edward Freeman  Procedure(s) Performed: OPEN LEFT HERNIA REPAIR INGUINAL WITH MESH (Left: Groin) INSERTION OF MESH (Left: Groin)     Patient location during evaluation: PACU Anesthesia Type: Regional and General Level of consciousness: awake and alert Pain management: pain level controlled Vital Signs Assessment: post-procedure vital signs reviewed and stable Respiratory status: spontaneous breathing, nonlabored ventilation, respiratory function stable and patient connected to nasal cannula oxygen Cardiovascular status: blood pressure returned to baseline and stable Postop Assessment: no apparent nausea or vomiting Anesthetic complications: no  No notable events documented.  Last Vitals:  Vitals:   05/02/23 1242 05/02/23 1302  BP: (!) 172/94 (!) 186/95  Pulse: (!) 59 (!) 50  Resp: 16 16  Temp:  36.4 C  SpO2: 98% 98%    Last Pain:  Vitals:   05/02/23 1302  TempSrc:   PainSc: 7                  Edward Freeman

## 2023-05-02 NOTE — Anesthesia Preprocedure Evaluation (Addendum)
Anesthesia Evaluation  Patient identified by MRN, date of birth, ID band Patient awake    Reviewed: Allergy & Precautions, NPO status , Patient's Chart, lab work & pertinent test results  Airway Mallampati: II  TM Distance: >3 FB Neck ROM: Full    Dental  (+) Chipped, Dental Advisory Given,    Pulmonary neg pulmonary ROS   Pulmonary exam normal breath sounds clear to auscultation       Cardiovascular hypertension, Pt. on medications + angina  + CAD, + Past MI and + Cardiac Stents  Normal cardiovascular exam+ dysrhythmias Supra Ventricular Tachycardia  Rhythm:Regular Rate:Normal     Neuro/Psych  Headaches  negative psych ROS   GI/Hepatic Neg liver ROS,GERD  ,,  Endo/Other  negative endocrine ROS    Renal/GU negative Renal ROS  negative genitourinary   Musculoskeletal negative musculoskeletal ROS (+)    Abdominal   Peds  Hematology negative hematology ROS (+)   Anesthesia Other Findings   Reproductive/Obstetrics                             Anesthesia Physical Anesthesia Plan  ASA: 3  Anesthesia Plan: General and Regional   Post-op Pain Management: Regional block* and Tylenol PO (pre-op)*   Induction: Intravenous  PONV Risk Score and Plan: 2 and Ondansetron, Dexamethasone and Treatment may vary due to age or medical condition  Airway Management Planned: LMA  Additional Equipment:   Intra-op Plan:   Post-operative Plan: Extubation in OR  Informed Consent: I have reviewed the patients History and Physical, chart, labs and discussed the procedure including the risks, benefits and alternatives for the proposed anesthesia with the patient or authorized representative who has indicated his/her understanding and acceptance.     Dental advisory given  Plan Discussed with: CRNA  Anesthesia Plan Comments:        Anesthesia Quick Evaluation

## 2023-05-02 NOTE — Op Note (Signed)
OPEN LEFT HERNIA REPAIR INGUINAL WITH MESH, INSERTION OF MESH  Procedure Note  Edward Freeman 05/02/2023   Pre-op Diagnosis: LEFT INGUINAL HERNIA     Post-op Diagnosis: same  Procedure(s): OPEN LEFT HERNIA REPAIR INGUINAL WITH MESH   Surgeon(s): Abigail Miyamoto, MD Carollee Herter, MD [facility] Resident  Anesthesia: General  Staff:  Circulator: Maryan Rued, RN Relief Circulator: Clide Cliff, RN Relief Scrub: Randalyn Rhea, RN Scrub Person: Wardell Heath, CST  Estimated Blood Loss: Minimal  Findings: The patient was found to have a very large indirect right inguinal hernia which was repaired with a large piece of ProGrip Prolene mesh from a Covidien  Procedure: The patient was brought to the operating room and identified as the correct patient.  He was placed supine on the operating room table and general anesthesia was induced.  His abdomen was then prepped and draped in usual sterile fashion.  We anesthetized the skin in the left inguinal area with Marcaine.  We then made a longitudinal incision with a scalpel in the left inguinal area.  We dissected down through Scarpa's fascia with the electrocautery.  The patient had a very large hernia sac coming through the external ring.  We identified the external oblique fascia and opened it toward the internal and external ring.  We then controlled the testicular cord and structures with a Penrose drain.  The patient had a very large indirect hernia sac which was separated from the cord structures.  It was a very thickened sac.  All contents had been reduced back into the abdominal cavity we traced the sac down to the internal ring.  We then excised the sac and closed the base of the with a pursestring 2-0 silk suture.  Next a piece of large Prolene ProGrip mesh was brought to the field.  It was placed against the pubic tubercle and brought around the cord structures covering the indirect ring.  We then sewed the mesh to the pubic  cervical and shelving edge of the inguinal ligament with 2 separate 2-0 Vicryl sutures.  Wide coverage of the internal ring and inguinal floor appeared to be achieved.  We then closed the external oblique fascia over the top of the mesh with a running 2-0 Vicryl suture.  Scarpa's fascia was closed with interrupted 3-0 Vicryl sutures and the skin was closed with a running 4-0 Monocryl.  Dermabond was then applied.  The patient tolerated the procedure well.  All the counts were correct at the end of the procedure.  The patient was then extubated in the operating room and taken in a stable condition to the recovery room.                         Abigail Miyamoto   Date: 05/02/2023  Time: 12:09 PM

## 2023-05-03 ENCOUNTER — Encounter (HOSPITAL_BASED_OUTPATIENT_CLINIC_OR_DEPARTMENT_OTHER): Payer: Self-pay | Admitting: Surgery

## 2023-10-12 ENCOUNTER — Ambulatory Visit (HOSPITAL_COMMUNITY): Payer: Medicare HMO | Attending: Internal Medicine

## 2023-10-12 DIAGNOSIS — I214 Non-ST elevation (NSTEMI) myocardial infarction: Secondary | ICD-10-CM | POA: Insufficient documentation

## 2023-10-12 DIAGNOSIS — I10 Essential (primary) hypertension: Secondary | ICD-10-CM | POA: Insufficient documentation

## 2023-10-12 DIAGNOSIS — I7121 Aneurysm of the ascending aorta, without rupture: Secondary | ICD-10-CM

## 2023-10-12 DIAGNOSIS — R001 Bradycardia, unspecified: Secondary | ICD-10-CM | POA: Diagnosis not present

## 2023-10-12 DIAGNOSIS — E785 Hyperlipidemia, unspecified: Secondary | ICD-10-CM | POA: Insufficient documentation

## 2023-10-12 LAB — ECHOCARDIOGRAM COMPLETE
Area-P 1/2: 1.88 cm2
P 1/2 time: 675 ms
S' Lateral: 3.8 cm

## 2023-10-14 ENCOUNTER — Other Ambulatory Visit: Payer: Self-pay | Admitting: *Deleted

## 2023-10-14 ENCOUNTER — Encounter: Payer: Self-pay | Admitting: *Deleted

## 2023-10-14 DIAGNOSIS — I7121 Aneurysm of the ascending aorta, without rupture: Secondary | ICD-10-CM

## 2023-11-04 DIAGNOSIS — R972 Elevated prostate specific antigen [PSA]: Secondary | ICD-10-CM | POA: Diagnosis not present

## 2023-11-11 DIAGNOSIS — R351 Nocturia: Secondary | ICD-10-CM | POA: Diagnosis not present

## 2023-11-11 DIAGNOSIS — R972 Elevated prostate specific antigen [PSA]: Secondary | ICD-10-CM | POA: Diagnosis not present

## 2023-11-11 DIAGNOSIS — N401 Enlarged prostate with lower urinary tract symptoms: Secondary | ICD-10-CM | POA: Diagnosis not present

## 2023-12-19 ENCOUNTER — Encounter: Payer: Self-pay | Admitting: Cardiovascular Disease

## 2023-12-19 ENCOUNTER — Ambulatory Visit: Payer: Medicare HMO | Attending: Cardiovascular Disease | Admitting: Cardiovascular Disease

## 2023-12-19 VITALS — BP 144/82 | HR 47 | Ht 70.0 in | Wt 199.8 lb

## 2023-12-19 DIAGNOSIS — I214 Non-ST elevation (NSTEMI) myocardial infarction: Secondary | ICD-10-CM

## 2023-12-19 DIAGNOSIS — E785 Hyperlipidemia, unspecified: Secondary | ICD-10-CM

## 2023-12-19 DIAGNOSIS — I7121 Aneurysm of the ascending aorta, without rupture: Secondary | ICD-10-CM

## 2023-12-19 DIAGNOSIS — I10 Essential (primary) hypertension: Secondary | ICD-10-CM | POA: Diagnosis not present

## 2023-12-19 NOTE — Patient Instructions (Signed)
 Medication Instructions:  Your physician recommends that you continue on your current medications as directed. Please refer to the Current Medication list given to you today.  *If you need a refill on your cardiac medications before your next appointment, please call your pharmacy*   Testing/Procedures: Your physician has requested that you have an echocardiogram. Echocardiography is a painless test that uses sound waves to create images of your heart. It provides your doctor with information about the size and shape of your heart and how well your heart's chambers and valves are working. This procedure takes approximately one hour. There are no restrictions for this procedure. Please do NOT wear cologne, perfume, aftershave, or lotions (deodorant is allowed). Please arrive 15 minutes prior to your appointment time. **To be done in December**  Please note: We ask at that you not bring children with you during ultrasound (echo/ vascular) testing. Due to room size and safety concerns, children are not allowed in the ultrasound rooms during exams. Our front office staff cannot provide observation of children in our lobby area while testing is being conducted. An adult accompanying a patient to their appointment will only be allowed in the ultrasound room at the discretion of the ultrasound technician under special circumstances. We apologize for any inconvenience.    Follow-Up: At [facility], you and your health needs are our priority.  As part of our continuing mission to provide you with exceptional heart care, we have created designated Provider Care Teams.  These Care Teams include your primary Cardiologist (physician) and Advanced Practice Providers (APPs -  Physician Assistants and Nurse Practitioners) who all work together to provide you with the care you need, when you need it.  We recommend signing up for the patient portal called "MyChart".  Sign up information is provided on this  After Visit Summary.  MyChart is used to connect with patients for Virtual Visits (Telemedicine).  Patients are able to view lab/test results, encounter notes, upcoming appointments, etc.  Non-urgent messages can be sent to your provider as well.   To learn more about what you can do with MyChart, go to [email or web address].    Your next appointment:   12 month(s)  Provider:   Lauro Portal, MD

## 2023-12-19 NOTE — Assessment & Plan Note (Signed)
 History of dyslipidemia on statin therapy followed by his PCP.  His last lipid profile performed 12/23/2021 revealed total cholesterol 128, LDL 53 and HDL 65.

## 2023-12-19 NOTE — Assessment & Plan Note (Signed)
 History of essential hypertension her blood pressure measured today at 144/82.  He is on amlodipine .

## 2023-12-19 NOTE — Assessment & Plan Note (Signed)
 History of small ascending thoracic aortic aneurysm being followed by 2D echocardiography last checked 12//24 when it was measured at 41 mm that had remained stable.  This will be checked on annual basis.

## 2023-12-19 NOTE — Assessment & Plan Note (Signed)
 History of CAD status post non-STEMI 09/25/2018.  He underwent cardiac catheterization by Dr. Jacquelynn Matter revealing occluded dominant RCA with left-to-right collaterals.  He was stented with a Synergy 3.5 mm x 20 mm long drug-eluting stent with excellent result.  I recatheterized him 12/11/2018 because of symptoms revealing a widely patent stent with no other significant disease.  He has been asymptomatic since.  He did have a normal 2D echo and negative Myoview  stress test 08/26/2020.

## 2023-12-19 NOTE — Progress Notes (Signed)
 12/19/2023 Edward Freeman   Aug 05, 1951  [id number]  Primary Physician Annemarie Barry, Randel Buss, NP Primary Cardiologist: Avanell Leigh MD Bennye Bravo, [place]  HPI:  Edward Freeman is a 73 y.o.  married African American male father of 2, grandfather to 2 grandchildren he works doing Insurance claims handler. I last saw him in the office 12/24/2022. I performed cardiac catheterization on him 11/04/03 which was completely normal. His cardiac risk factors include hypertension as well as family history with a mother who died of a myocardial infarction at age 14. He denies chest pain or shortness of breath.  He also had a small apical nodule by CT scan several years ago with recommendations of followup which was never pursued. I've offered him follow-up CT scans which she has declined because of fiscal constraints.   He presented on 09/25/2018 with a non-STEMI.  His troponin rose to 15.  He underwent cardiac catheterization by Dr. Jacquelynn Matter 09/25/2018 demonstrating an occluded dominant RCA with left-to-right collaterals.  He was stented with a synergy 3.5 mm x 20 mm long drug-eluting stent with excellent result.    He was on aspirin  and Brilinta  however because of shortness of breath Brilinta  this was transitioned to Plavix .  Because of recurrent symptoms similar to his preintervention symptoms I performed cardiac catheterization on him 12/11/2018 in the right radial approach revealing a widely patent RCA stent with no other significant disease.   Since I saw him a year ago he has remained stable.  He is now working part-time.  He enjoys fishing and working in his yard.  He works out on the treadmill 2-3 times a week.  His last echo performed 10/12/23 revealed unchanged ascending thoracic aortic dimensions of 41 mm.   Current Meds  Medication Sig   amLODipine  (NORVASC ) 5 MG tablet Take 1 tablet by mouth once daily   aspirin  81 MG tablet Take 1 tablet (81 mg total) by mouth daily. (Patient taking  differently: Take 81 mg by mouth daily after lunch.)   atorvastatin  (LIPITOR ) 80 MG tablet Take 1 tablet by mouth once daily   finasteride  (PROSCAR ) 5 MG tablet Take 5 mg by mouth daily.   ibuprofen  (ADVIL ) 800 MG tablet Take 1 tablet (800 mg total) by mouth every 8 (eight) hours as needed.   nitroGLYCERIN  (NITROSTAT ) 0.4 MG SL tablet Place 1 tablet (0.4 mg total) under the tongue every 5 (five) minutes as needed for chest pain (up to 3 doses. If taking 3rd dose call 911).   tamsulosin (FLOMAX) 0.4 MG CAPS capsule Take 1 capsule by mouth daily.   traMADol  (ULTRAM ) 50 MG tablet Take 1 tablet (50 mg total) by mouth every 6 (six) hours as needed.     No Known Allergies  Social History   Socioeconomic History   Marital status: Married    Spouse name: Not on file   Number of children: Not on file   Years of education: Not on file   Highest education level: Not on file  Occupational History   Not on file  Tobacco Use   Smoking status: Never   Smokeless tobacco: Never  Vaping Use   Vaping status: Never Used  Substance and Sexual Activity   Alcohol use: Yes    Comment: 1-2 beers a month   Drug use: No   Sexual activity: Not on file  Other Topics Concern   Not on file  Social History Narrative   Not on file   Social Drivers  of Health   Financial Resource Strain: Low Risk  (04/27/2023)   Overall Financial Resource Strain (CARDIA)    Difficulty of Paying Living Expenses: Not hard at all  Food Insecurity: No Food Insecurity (04/27/2023)   Hunger Vital Sign    Worried About Running Out of Food in the Last Year: Never true    Ran Out of Food in the Last Year: Never true  Transportation Needs: No Transportation Needs (04/27/2023)   PRAPARE - Administrator, Civil Service (Medical): No    Lack of Transportation (Non-Medical): No  Physical Activity: Insufficiently Active (04/27/2023)   Exercise Vital Sign    Days of Exercise per Week: 2 days    Minutes of Exercise per  Session: 30 min  Stress: No Stress Concern Present (04/27/2023)   [employer] of Occupational Health - Occupational Stress Questionnaire    Feeling of Stress : Not at all  Social Connections: Socially Integrated (04/27/2023)   Social Connection and Isolation Panel [NHANES]    Frequency of Communication with Friends and Family: More than three times a week    Frequency of Social Gatherings with Friends and Family: More than three times a week    Attends Religious Services: More than 4 times per year    Active Member of [employer] or Organizations: Yes    Attends Engineer, structural: More than 4 times per year    Marital Status: Married  Catering manager Violence: Not At Risk (04/27/2023)   Humiliation, Afraid, Rape, and Kick questionnaire    Fear of Current or Ex-Partner: No    Emotionally Abused: No    Physically Abused: No    Sexually Abused: No     Review of Systems: General: negative for chills, fever, night sweats or weight changes.  Cardiovascular: negative for chest pain, dyspnea on exertion, edema, orthopnea, palpitations, paroxysmal nocturnal dyspnea or shortness of breath Dermatological: negative for rash Respiratory: negative for cough or wheezing Urologic: negative for hematuria Abdominal: negative for nausea, vomiting, diarrhea, bright red blood per rectum, melena, or hematemesis Neurologic: negative for visual changes, syncope, or dizziness All other systems reviewed and are otherwise negative except as noted above.    Blood pressure (!) 144/82, pulse (!) 47, height 5\' 10"  (1.778 m), weight 199 lb 12.8 oz (90.6 kg), SpO2 96%.  General appearance: alert and no distress Neck: no adenopathy, no carotid bruit, no JVD, supple, symmetrical, trachea midline, and thyroid  not enlarged, symmetric, no tenderness/mass/nodules Lungs: clear to auscultation bilaterally Heart: regular rate and rhythm, S1, S2 normal, no murmur, click, rub or gallop Extremities: extremities  normal, atraumatic, no cyanosis or edema Pulses: 2+ and symmetric Skin: Skin color, texture, turgor normal. No rashes or lesions Neurologic: Grossly normal  EKG EKG Interpretation Date/Time:  Monday December 19 2023 08:26:34 EST Ventricular Rate:  47 PR Interval:  188 QRS Duration:  96 QT Interval:  432 QTC Calculation: 382 R Axis:   5  Text Interpretation: Sinus bradycardia Possible Left atrial enlargement Nonspecific T wave abnormality When compared with ECG of 26-Sep-2018 01:19, No significant change was found Confirmed by Lauro Portal [phone]) on 12/19/2023 8:39:07 AM    ASSESSMENT AND PLAN:   Essential hypertension History of essential hypertension her blood pressure measured today at 144/82.  He is on amlodipine .  Dyslipidemia, goal LDL below 70 History of dyslipidemia on statin therapy followed by his PCP.  His last lipid profile performed 12/23/2021 revealed total cholesterol 128, LDL 53 and HDL 65.  NSTEMI (non-ST  elevated myocardial infarction) (HCC) History of CAD status post non-STEMI 09/25/2018.  He underwent cardiac catheterization by Dr. Jacquelynn Matter revealing occluded dominant RCA with left-to-right collaterals.  He was stented with a Synergy 3.5 mm x 20 mm long drug-eluting stent with excellent result.  I recatheterized him 12/11/2018 because of symptoms revealing a widely patent stent with no other significant disease.  He has been asymptomatic since.  He did have a normal 2D echo and negative Myoview  stress test 08/26/2020.  Thoracic aortic aneurysm History of small ascending thoracic aortic aneurysm being followed by 2D echocardiography last checked 12//24 when it was measured at 41 mm that had remained stable.  This will be checked on annual basis.     Avanell Leigh MD FACP,FACC,FAHA, [facility] 12/19/2023 8:46 AM

## 2023-12-28 ENCOUNTER — Telehealth: Payer: Self-pay | Admitting: Adult Health

## 2023-12-28 NOTE — Telephone Encounter (Signed)
Copied from CRM [id number]. Topic: General - Other >> Dec 28, 2023  2:07 PM Adele Barthel wrote: Reason for CRM: Patient was returning call from providers nurse. His physical appt for 2/20 was canceled due to weather. He received a voicemail that he needed to call back to schedule a physical appointment sooner than the 04/2024 avail showing in schedule. Attempted to reschedule, but was unable to find sooner avail than 06/20. He reports he was advised that they could schedule him sooner than that. Unable to contact [facility] due to closure.   Patient requested call back, CB# [phone]. He did not want to schedule in 04/2024 and would prefer someone to call him.

## 2023-12-28 NOTE — Telephone Encounter (Signed)
Called pt and left vm about r/s physical appt to sooner date.

## 2023-12-29 ENCOUNTER — Encounter: Payer: Medicare HMO | Admitting: Adult Health

## 2024-03-22 ENCOUNTER — Other Ambulatory Visit: Payer: Self-pay | Admitting: Cardiovascular Disease

## 2024-04-02 NOTE — Telephone Encounter (Signed)
 He is on Aspirin  only and does not need Plavix  after review of JB notes

## 2024-10-11 ENCOUNTER — Ambulatory Visit (HOSPITAL_COMMUNITY)
Admission: RE | Admit: 2024-10-11 | Discharge: 2024-10-11 | Disposition: A | Source: Ambulatory Visit | Attending: Cardiovascular Disease | Admitting: Cardiovascular Disease

## 2024-10-11 DIAGNOSIS — I7121 Aneurysm of the ascending aorta, without rupture: Secondary | ICD-10-CM | POA: Insufficient documentation

## 2024-10-11 LAB — ECHOCARDIOGRAM COMPLETE
Area-P 1/2: 3.21 cm2
P 1/2 time: 471 ms
S' Lateral: 3.6 cm

## 2024-10-12 ENCOUNTER — Ambulatory Visit: Payer: Self-pay | Admitting: Cardiovascular Disease

## 2024-10-12 ENCOUNTER — Ambulatory Visit (HOSPITAL_COMMUNITY): Payer: Medicare HMO

## 2024-10-12 DIAGNOSIS — I7121 Aneurysm of the ascending aorta, without rupture: Secondary | ICD-10-CM

## 2024-12-11 ENCOUNTER — Other Ambulatory Visit: Payer: Self-pay | Admitting: Cardiovascular Disease

## 2024-12-14 NOTE — Progress Notes (Signed)
 Edward Freeman                                          MRN: [id number]   12/14/2024   The [facility] Quality Team Specialist reviewed this patient medical record for the purposes of chart review for care gap closure. The following were reviewed: chart review for care gap closure-controlling blood pressure.    [facility] Quality Team
